# Patient Record
Sex: Female | Born: 1960 | Hispanic: No | Marital: Married | Smoking: Former smoker
Health system: Southern US, Community
[De-identification: ages and names within clinical notes are randomized; demographics above are authoritative.]

## PROBLEM LIST (undated history)

## (undated) DIAGNOSIS — G629 Polyneuropathy, unspecified: Secondary | ICD-10-CM

## (undated) DIAGNOSIS — G894 Chronic pain syndrome: Secondary | ICD-10-CM

## (undated) DIAGNOSIS — Z8619 Personal history of other infectious and parasitic diseases: Secondary | ICD-10-CM

## (undated) DIAGNOSIS — M797 Fibromyalgia: Secondary | ICD-10-CM

## (undated) DIAGNOSIS — G8929 Other chronic pain: Secondary | ICD-10-CM

## (undated) DIAGNOSIS — M1710 Unilateral primary osteoarthritis, unspecified knee: Secondary | ICD-10-CM

## (undated) DIAGNOSIS — R069 Unspecified abnormalities of breathing: Secondary | ICD-10-CM

## (undated) DIAGNOSIS — R87629 Unspecified abnormal cytological findings in specimens from vagina: Secondary | ICD-10-CM

## (undated) DIAGNOSIS — K635 Polyp of colon: Secondary | ICD-10-CM

## (undated) DIAGNOSIS — I10 Essential (primary) hypertension: Secondary | ICD-10-CM

## (undated) DIAGNOSIS — F32A Depression, unspecified: Secondary | ICD-10-CM

## (undated) DIAGNOSIS — F329 Major depressive disorder, single episode, unspecified: Secondary | ICD-10-CM

## (undated) DIAGNOSIS — K219 Gastro-esophageal reflux disease without esophagitis: Secondary | ICD-10-CM

## (undated) DIAGNOSIS — Z91030 Bee allergy status: Secondary | ICD-10-CM

## (undated) DIAGNOSIS — I639 Cerebral infarction, unspecified: Secondary | ICD-10-CM

## (undated) DIAGNOSIS — O24419 Gestational diabetes mellitus in pregnancy, unspecified control: Secondary | ICD-10-CM

## (undated) DIAGNOSIS — R296 Repeated falls: Secondary | ICD-10-CM

## (undated) DIAGNOSIS — M5416 Radiculopathy, lumbar region: Secondary | ICD-10-CM

## (undated) DIAGNOSIS — E785 Hyperlipidemia, unspecified: Secondary | ICD-10-CM

## (undated) DIAGNOSIS — D649 Anemia, unspecified: Secondary | ICD-10-CM

## (undated) DIAGNOSIS — K121 Other forms of stomatitis: Secondary | ICD-10-CM

## (undated) DIAGNOSIS — G4700 Insomnia, unspecified: Secondary | ICD-10-CM

## (undated) HISTORY — DX: Bee allergy status: Z91.030

## (undated) HISTORY — DX: Polyneuropathy, unspecified: G62.9

## (undated) HISTORY — DX: Other forms of stomatitis: K12.1

## (undated) HISTORY — DX: Hyperlipidemia, unspecified: E78.5

## (undated) HISTORY — DX: Anemia, unspecified: D64.9

## (undated) HISTORY — DX: Unspecified abnormal cytological findings in specimens from vagina: R87.629

## (undated) HISTORY — DX: Fibromyalgia: M79.7

## (undated) HISTORY — DX: Personal history of other infectious and parasitic diseases: Z86.19

## (undated) HISTORY — DX: Major depressive disorder, single episode, unspecified: F32.9

## (undated) HISTORY — PX: SPINE SURGERY: SHX786

## (undated) HISTORY — PX: BREAST BIOPSY: SHX20

## (undated) HISTORY — DX: Other chronic pain: G89.29

## (undated) HISTORY — DX: Unilateral primary osteoarthritis, unspecified knee: M17.10

## (undated) HISTORY — DX: Polyp of colon: K63.5

## (undated) HISTORY — DX: Repeated falls: R29.6

## (undated) HISTORY — DX: Cerebral infarction, unspecified: I63.9

## (undated) HISTORY — PX: BREAST SURGERY: SHX581

## (undated) HISTORY — DX: Insomnia, unspecified: G47.00

## (undated) HISTORY — DX: Depression, unspecified: F32.A

## (undated) HISTORY — DX: Unspecified abnormalities of breathing: R06.9

## (undated) HISTORY — DX: Essential (primary) hypertension: I10

## (undated) HISTORY — DX: Radiculopathy, lumbar region: M54.16

## (undated) HISTORY — PX: JOINT REPLACEMENT: SHX530

## (undated) HISTORY — DX: Gastro-esophageal reflux disease without esophagitis: K21.9

## (undated) HISTORY — DX: Chronic pain syndrome: G89.4

## (undated) HISTORY — DX: Gestational diabetes mellitus in pregnancy, unspecified control: O24.419

---

## 2006-02-23 ENCOUNTER — Other Ambulatory Visit: Admission: RE | Admit: 2006-02-23 | Discharge: 2006-02-23 | Payer: Self-pay | Admitting: Family Medicine

## 2006-07-02 ENCOUNTER — Ambulatory Visit: Payer: Self-pay | Admitting: Vascular Surgery

## 2006-07-02 ENCOUNTER — Ambulatory Visit (HOSPITAL_COMMUNITY): Admission: RE | Admit: 2006-07-02 | Discharge: 2006-07-02 | Payer: Self-pay | Admitting: Family Medicine

## 2006-08-05 ENCOUNTER — Other Ambulatory Visit: Admission: RE | Admit: 2006-08-05 | Discharge: 2006-08-05 | Payer: Self-pay | Admitting: Family Medicine

## 2010-04-10 DIAGNOSIS — R296 Repeated falls: Secondary | ICD-10-CM

## 2010-04-10 HISTORY — DX: Repeated falls: R29.6

## 2010-05-01 DIAGNOSIS — Z8619 Personal history of other infectious and parasitic diseases: Secondary | ICD-10-CM

## 2010-05-01 DIAGNOSIS — K121 Other forms of stomatitis: Secondary | ICD-10-CM

## 2010-05-01 HISTORY — DX: Personal history of other infectious and parasitic diseases: Z86.19

## 2010-05-01 HISTORY — DX: Other forms of stomatitis: K12.1

## 2010-05-23 DIAGNOSIS — G629 Polyneuropathy, unspecified: Secondary | ICD-10-CM

## 2010-05-23 HISTORY — DX: Polyneuropathy, unspecified: G62.9

## 2010-06-22 ENCOUNTER — Encounter: Payer: Self-pay | Admitting: Sports Medicine

## 2010-08-22 DIAGNOSIS — G894 Chronic pain syndrome: Secondary | ICD-10-CM

## 2010-08-22 DIAGNOSIS — G629 Polyneuropathy, unspecified: Secondary | ICD-10-CM

## 2010-08-22 DIAGNOSIS — G4700 Insomnia, unspecified: Secondary | ICD-10-CM

## 2010-08-22 DIAGNOSIS — M797 Fibromyalgia: Secondary | ICD-10-CM

## 2010-08-22 DIAGNOSIS — M5416 Radiculopathy, lumbar region: Secondary | ICD-10-CM

## 2010-08-22 DIAGNOSIS — I10 Essential (primary) hypertension: Secondary | ICD-10-CM

## 2010-08-22 HISTORY — DX: Fibromyalgia: M79.7

## 2010-08-22 HISTORY — DX: Radiculopathy, lumbar region: M54.16

## 2010-08-22 HISTORY — DX: Polyneuropathy, unspecified: G62.9

## 2010-08-22 HISTORY — DX: Insomnia, unspecified: G47.00

## 2010-08-22 HISTORY — DX: Chronic pain syndrome: G89.4

## 2010-08-22 HISTORY — DX: Essential (primary) hypertension: I10

## 2010-10-22 ENCOUNTER — Encounter: Payer: Self-pay | Admitting: Family Medicine

## 2012-09-08 ENCOUNTER — Other Ambulatory Visit: Payer: Self-pay | Admitting: Nurse Practitioner

## 2012-09-09 NOTE — Telephone Encounter (Signed)
Last saw [facility] [place] on 06-13-12 for chronic health check. Last written on 06-13-12 for #30 with 2RFs. Please advise. If approved please have nurse phone in to [place] in [place]. Thank you

## 2012-09-13 ENCOUNTER — Ambulatory Visit: Payer: Self-pay | Admitting: Nurse Practitioner

## 2012-09-15 ENCOUNTER — Other Ambulatory Visit: Payer: Self-pay | Admitting: Nurse Practitioner

## 2012-09-15 ENCOUNTER — Telehealth: Payer: Self-pay | Admitting: Nurse Practitioner

## 2012-09-15 MED ORDER — ZOLPIDEM TARTRATE 10 MG PO TABS: 10.0000 mg | ORAL_TABLET | Freq: Every evening | ORAL | Status: DC | PRN

## 2012-09-15 NOTE — Telephone Encounter (Signed)
Please advise 

## 2012-09-20 ENCOUNTER — Ambulatory Visit: Payer: Self-pay | Admitting: Nurse Practitioner

## 2012-10-05 ENCOUNTER — Ambulatory Visit (INDEPENDENT_AMBULATORY_CARE_PROVIDER_SITE_OTHER): Admitting: Nurse Practitioner

## 2012-10-05 ENCOUNTER — Encounter: Payer: Self-pay | Admitting: Nurse Practitioner

## 2012-10-05 VITALS — BP 91/68 | HR 91 | Temp 97.1°F | Ht 61.0 in | Wt 263.0 lb

## 2012-10-05 DIAGNOSIS — I6350 Cerebral infarction due to unspecified occlusion or stenosis of unspecified cerebral artery: Secondary | ICD-10-CM

## 2012-10-05 DIAGNOSIS — J45909 Unspecified asthma, uncomplicated: Secondary | ICD-10-CM

## 2012-10-05 DIAGNOSIS — I10 Essential (primary) hypertension: Secondary | ICD-10-CM

## 2012-10-05 DIAGNOSIS — F329 Major depressive disorder, single episode, unspecified: Secondary | ICD-10-CM

## 2012-10-05 DIAGNOSIS — G4700 Insomnia, unspecified: Secondary | ICD-10-CM

## 2012-10-05 DIAGNOSIS — K219 Gastro-esophageal reflux disease without esophagitis: Secondary | ICD-10-CM

## 2012-10-05 DIAGNOSIS — F32A Depression, unspecified: Secondary | ICD-10-CM

## 2012-10-05 DIAGNOSIS — I639 Cerebral infarction, unspecified: Secondary | ICD-10-CM

## 2012-10-05 DIAGNOSIS — M797 Fibromyalgia: Secondary | ICD-10-CM

## 2012-10-05 DIAGNOSIS — IMO0001 Reserved for inherently not codable concepts without codable children: Secondary | ICD-10-CM

## 2012-10-05 DIAGNOSIS — R3915 Urgency of urination: Secondary | ICD-10-CM

## 2012-10-05 DIAGNOSIS — E785 Hyperlipidemia, unspecified: Secondary | ICD-10-CM

## 2012-10-05 LAB — COMPLETE METABOLIC PANEL WITH GFR
ALT: 18 U/L (ref 0–35)
AST: 13 U/L (ref 0–37)
Albumin: 4.2 g/dL (ref 3.5–5.2)
Alkaline Phosphatase: 141 U/L — ABNORMAL HIGH (ref 39–117)
BUN: 23 mg/dL (ref 6–23)
Potassium: 3.8 mEq/L (ref 3.5–5.3)
Sodium: 139 mEq/L (ref 135–145)
Total Protein: 7.7 g/dL (ref 6.0–8.3)

## 2012-10-05 NOTE — Patient Instructions (Signed)

## 2012-10-05 NOTE — Progress Notes (Signed)
Subjective:    Patient ID: Marie Carter, female    DOB: 02/13/1961, 52 y.o.   MRN: [id number]  HPI-patient in today for follow-up of chronic medical  problems. Patient says she is doing about the same. Still has lots of pain from fibromyalgia but is tolerable. Other chronic problems include:  Patient Active Problem List   Diagnosis Date Noted  . GERD (gastroesophageal reflux disease) 10/05/2012  . Hyperlipidemia 10/05/2012  . Fibromyalgia 10/05/2012  . CVA (cerebral infarction) 10/05/2012  . Depression 10/05/2012  . Asthma, chronic 10/05/2012  . Hypertension 10/05/2012  . Urinary urgency 10/05/2012  . Insomnia 10/05/2012  Current meds:  Current Outpatient Prescriptions on File Prior to Visit  Medication Sig Dispense Refill  . cyclobenzaprine (AMRIX) 15 MG 24 hr capsule Take 15 mg by mouth daily.        . DULoxetine (CYMBALTA) 60 MG capsule Take 60 mg by mouth daily.        [name] eletriptan (RELPAX) 20 MG tablet One tablet by mouth at onset of headache. May repeat in 2 hours if headache persists or recurs. may repeat in 2 hours if necessary      . EPINEPHrine (EPI-PEN) 0.3 mg/0.3 mL DEVI Inject 0.3 mg into the muscle once. As Directed for Bee stings       . gabapentin (NEURONTIN) 300 MG capsule Take 300 mg by mouth. Take two tablets by mouth three times a day       . topiramate (TOPAMAX) 50 MG tablet Take 50 mg by mouth 3 (three) times daily.       . traMADol (ULTRAM) 50 MG tablet Take 50 mg by mouth 3 (three) times daily as needed.       . zolpidem (AMBIEN) 10 MG tablet Take 1 tablet (10 mg total) by mouth at bedtime as needed for sleep.  30 tablet  0   No current facility-administered medications on file prior to visit.         Review of Systems  Constitutional: Negative.   Eyes: Negative.   Respiratory: Negative.   Cardiovascular: Negative.   Genitourinary: Negative.   Musculoskeletal: Positive for back pain, joint swelling and arthralgias.  Neurological: Positive for  headaches (occassional). Negative for tremors, seizures, weakness and numbness.  Psychiatric/Behavioral: Negative.        Objective:   Physical Exam  Constitutional: She is oriented to person, place, and time. She appears well-developed and well-nourished.  HENT:  Nose: Nose normal.  Mouth/Throat: Oropharynx is clear and moist.  Eyes: EOM are normal.  Neck: Trachea normal, normal range of motion and full passive range of motion without pain. Neck supple. No JVD present. Carotid bruit is not present. No thyromegaly present.  Cardiovascular: Normal rate, regular rhythm, normal heart sounds and intact distal pulses.  Exam reveals no gallop and no friction rub.   No murmur heard. Pulmonary/Chest: Effort normal and breath sounds normal.  Abdominal: Soft. Bowel sounds are normal. She exhibits no distension and no mass. There is no tenderness.  Musculoskeletal: Normal range of motion.  Multiple tender points up and down spine  Lymphadenopathy:    She has no cervical adenopathy.  Neurological: She is alert and oriented to person, place, and time. She has normal reflexes.  Skin: Skin is warm and dry.  Psychiatric: She has a normal mood and affect. Her behavior is normal. Judgment and thought content normal.  BP 91/68  Pulse 91  Temp(Src) 97.1 F (36.2 C) (Oral)  Ht 5\' 1"  (1.549 m)  Wt 263 lb (119.296 kg)  BMI 49.72 kg/m2  LMP 09/13/2012         Assessment & Plan:  GERD (gastroesophageal reflux disease)  Hyperlipidemia - Plan: NMR Lipoprofile with Lipids  Fibromyalgia  CVA (cerebral infarction)  Depression  Asthma, chronic  Hypertension - Plan: COMPLETE METABOLIC PANEL WITH GFR  Urinary urgency  Insomnia  Continue all current meds Labs pending Diet and exercise encouraged F/U in 3 months  Mary-Margaret Daphine Deutscher, FNP

## 2012-10-07 LAB — NMR LIPOPROFILE WITH LIPIDS
Cholesterol, Total: 140 mg/dL (ref <200)
HDL Particle Number: 37.2 umol/L (ref >=30.5)
LDL Particle Number: 880 nmol/L (ref <1000)
Large VLDL-P: 3 nmol/L — ABNORMAL HIGH (ref <=2.7)
Triglycerides: 75 mg/dL (ref <150)
VLDL Size: 50.4 nm — ABNORMAL HIGH (ref <=46.6)

## 2012-10-13 ENCOUNTER — Other Ambulatory Visit: Payer: Self-pay | Admitting: Nurse Practitioner

## 2012-10-13 NOTE — Telephone Encounter (Signed)
Please call in ambien RX 

## 2012-10-13 NOTE — Telephone Encounter (Signed)
Last filled 09/15/12, last seen 10/05/12. Call into [facility]

## 2012-10-14 NOTE — Telephone Encounter (Signed)
Script called to [facility].

## 2012-10-31 ENCOUNTER — Ambulatory Visit (INDEPENDENT_AMBULATORY_CARE_PROVIDER_SITE_OTHER): Admitting: Family Medicine

## 2012-10-31 ENCOUNTER — Telehealth: Payer: Self-pay | Admitting: Family Medicine

## 2012-10-31 ENCOUNTER — Encounter: Payer: Self-pay | Admitting: Family Medicine

## 2012-10-31 VITALS — BP 112/76 | HR 76 | Temp 97.0°F | Ht 61.0 in | Wt 258.0 lb

## 2012-10-31 DIAGNOSIS — Z20828 Contact with and (suspected) exposure to other viral communicable diseases: Secondary | ICD-10-CM

## 2012-10-31 DIAGNOSIS — J069 Acute upper respiratory infection, unspecified: Secondary | ICD-10-CM

## 2012-10-31 DIAGNOSIS — J209 Acute bronchitis, unspecified: Secondary | ICD-10-CM

## 2012-10-31 MED ORDER — BENZONATATE 200 MG PO CAPS: 200.0000 mg | ORAL_CAPSULE | Freq: Two times a day (BID) | ORAL | Status: DC | PRN

## 2012-10-31 MED ORDER — OSELTAMIVIR PHOSPHATE 75 MG PO CAPS: 75.0000 mg | ORAL_CAPSULE | Freq: Two times a day (BID) | ORAL | Status: DC

## 2012-10-31 MED ORDER — AMOXICILLIN 875 MG PO TABS: 875.0000 mg | ORAL_TABLET | Freq: Two times a day (BID) | ORAL | Status: DC

## 2012-10-31 NOTE — Progress Notes (Signed)
Patient ID: Marie Carter, female   DOB: [birth year], 52 y.o.   MRN: [id number] SUBJECTIVE: HPI: Cough congestion started with a head congestion then went from there down. Fever and chills.. Symptoms for 6 days. Did get her flu shot. Her Uncle has had the flu last 2 weeks. bodyaches +  PMH/PSH: reviewed/updated in Epic  SH/FH: reviewed/updated in Epic  Allergies: reviewed/updated in Epic  Medications: reviewed/updated in Epic  Immunizations: reviewed/updated in Epic  ROS: As above in the HPI. All other systems are stable or negative.  OBJECTIVE: APPEARANCE:  Patient in no acute distress.The patient appeared well nourished and normally developed. Acyanotic. Waist: VITAL SIGNS:BP 112/76  Pulse 76  Temp(Src) 97 F (36.1 C) (Oral)  Ht 5\' 1"  (1.549 m)  Wt 258 lb (117.028 kg)  BMI 48.77 kg/m2  SpO2 97%  LMP 09/13/2012 Obese WF  SKIN: warm and  Dry without overt rashes, tattoos and scars  HEAD and Neck: without JVD, Head and scalp: normal Eyes:No scleral icterus. Fundi normal, eye movements normal. Ears: Auricle normal, canal normal, Tympanic membranes normal, insufflation normal. Nose: nasal congestion Throat: normal Neck & thyroid: normal  CHEST & LUNGS: Chest wall: normal Lungs:Rhonchi, rattling barking cough. No rales, no wheezes.paroxysms of cough  CVS: Reveals the PMI to be normally located. Regular rhythm, First and Second Heart sounds are normal,  absence of murmurs, rubs or gallops. Peripheral vasculature: Radial pulses: normal Dorsal pedis pulses: normal Posterior pulses: normal  ABDOMEN:  Appearance: obese Benign,, no organomegaly, no masses, no Abdominal Aortic enlargement. No Guarding , no rebound. No Bruits. Bowel sounds: normal  RECTAL: N/A GU: N/A  EXTREMETIES: nonedematous.  NEUROLOGIC: oriented to time,place and person; nonfocal.  ASSESSMENT: Acute upper respiratory infections of unspecified site - Plan: oseltamivir (TAMIFLU) 75 MG  capsule, benzonatate (TESSALON) 200 MG capsule  Exposure to influenza - Plan: oseltamivir (TAMIFLU) 75 MG capsule  Acute bronchitis - Plan: amoxicillin (AMOXIL) 875 MG tablet, benzonatate (TESSALON) 200 MG capsule  PLAN: No orders of the defined types were placed in this encounter.   Meds ordered this encounter  Medications  . oseltamivir (TAMIFLU) 75 MG capsule    Sig: Take 1 capsule (75 mg total) by mouth 2 (two) times daily.    Dispense:  10 capsule    Refill:  0  . amoxicillin (AMOXIL) 875 MG tablet    Sig: Take 1 tablet (875 mg total) by mouth 2 (two) times daily.    Dispense:  20 tablet    Refill:  0  . benzonatate (TESSALON) 200 MG capsule    Sig: Take 1 capsule (200 mg total) by mouth 2 (two) times daily as needed for cough.    Dispense:  20 capsule    Refill:  0   Will treat since she gives a h/o exposure to the influenza. Patient to use her albuterol inhaler as a preventative at this time.  Fluids rest.  Hand hygiene. Contagiousness discussed.  Return if symptoms worsen or fail to improve.  Alda Gaultney P. Modesto Charon, M.D.

## 2012-10-31 NOTE — Telephone Encounter (Signed)
appt made

## 2012-11-14 ENCOUNTER — Other Ambulatory Visit: Payer: Self-pay | Admitting: Nurse Practitioner

## 2012-11-16 NOTE — Telephone Encounter (Signed)
Last filled 10/13/12, last seen 10/05/12. [place] to called into [facility]

## 2012-11-16 NOTE — Telephone Encounter (Signed)
Please call in ambien rx with 2 refills 

## 2012-11-16 NOTE — Telephone Encounter (Signed)
Rx called to [facility]. 

## 2013-01-06 ENCOUNTER — Ambulatory Visit: Admitting: Nurse Practitioner

## 2013-02-10 ENCOUNTER — Encounter: Payer: Self-pay | Admitting: Nurse Practitioner

## 2013-02-10 ENCOUNTER — Ambulatory Visit (INDEPENDENT_AMBULATORY_CARE_PROVIDER_SITE_OTHER): Admitting: Nurse Practitioner

## 2013-02-10 VITALS — Temp 97.1°F | Ht 61.0 in | Wt 259.0 lb

## 2013-02-10 DIAGNOSIS — I10 Essential (primary) hypertension: Secondary | ICD-10-CM

## 2013-02-10 DIAGNOSIS — F329 Major depressive disorder, single episode, unspecified: Secondary | ICD-10-CM

## 2013-02-10 DIAGNOSIS — E785 Hyperlipidemia, unspecified: Secondary | ICD-10-CM

## 2013-02-10 DIAGNOSIS — M797 Fibromyalgia: Secondary | ICD-10-CM

## 2013-02-10 DIAGNOSIS — K219 Gastro-esophageal reflux disease without esophagitis: Secondary | ICD-10-CM

## 2013-02-10 DIAGNOSIS — L259 Unspecified contact dermatitis, unspecified cause: Secondary | ICD-10-CM

## 2013-02-10 DIAGNOSIS — I639 Cerebral infarction, unspecified: Secondary | ICD-10-CM

## 2013-02-10 DIAGNOSIS — G4700 Insomnia, unspecified: Secondary | ICD-10-CM

## 2013-02-10 DIAGNOSIS — IMO0001 Reserved for inherently not codable concepts without codable children: Secondary | ICD-10-CM

## 2013-02-10 DIAGNOSIS — G43909 Migraine, unspecified, not intractable, without status migrainosus: Secondary | ICD-10-CM

## 2013-02-10 DIAGNOSIS — I6350 Cerebral infarction due to unspecified occlusion or stenosis of unspecified cerebral artery: Secondary | ICD-10-CM

## 2013-02-10 MED ORDER — METHYLPREDNISOLONE ACETATE 80 MG/ML IJ SUSP
80.0000 mg | Freq: Once | INTRAMUSCULAR | Status: AC
  Administered 2013-02-10: 80 mg via INTRAMUSCULAR

## 2013-02-10 MED ORDER — ZOLPIDEM TARTRATE 10 MG PO TABS: 10.0000 mg | ORAL_TABLET | Freq: Every evening | ORAL | Status: DC | PRN

## 2013-02-10 MED ORDER — CYCLOBENZAPRINE HCL ER 15 MG PO CP24
ORAL_CAPSULE | ORAL | Status: DC
Start: 2013-02-10 — End: 2013-02-14

## 2013-02-10 MED ORDER — GABAPENTIN 300 MG PO CAPS
ORAL_CAPSULE | ORAL | Status: DC
Start: 2013-02-10 — End: 2013-02-14

## 2013-02-10 MED ORDER — PREDNISONE 20 MG PO TABS: ORAL_TABLET | ORAL | Status: DC

## 2013-02-10 MED ORDER — TOPIRAMATE 50 MG PO TABS: 50.0000 mg | ORAL_TABLET | Freq: Three times a day (TID) | ORAL | Status: DC

## 2013-02-10 NOTE — Patient Instructions (Signed)
Poison Ivy Poison ivy is a inflammation of the skin (contact dermatitis) caused by touching the allergens on the leaves of the ivy plant following previous exposure to the plant. The rash usually appears 48 hours after exposure. The rash is usually bumps (papules) or blisters (vesicles) in a linear pattern. Depending on your own sensitivity, the rash may simply cause redness and itching, or it may also progress to blisters which may break open. These must be well cared for to prevent secondary bacterial (germ) infection, followed by scarring. Keep any open areas dry, clean, dressed, and covered with an antibacterial ointment if needed. The eyes may also get puffy. The puffiness is worst in the morning and gets better as the day progresses. This dermatitis usually heals without scarring, within 2 to 3 weeks without treatment. HOME CARE INSTRUCTIONS  Thoroughly wash with soap and water as soon as you have been exposed to poison ivy. You have about one half hour to remove the plant resin before it will cause the rash. This washing will destroy the oil or antigen on the skin that is causing, or will cause, the rash. Be sure to wash under your fingernails as any plant resin there will continue to spread the rash. Do not rub skin vigorously when washing affected area. Poison ivy cannot spread if no oil from the plant remains on your body. A rash that has progressed to weeping sores will not spread the rash unless you have not washed thoroughly. It is also important to wash any clothes you have been wearing as these may carry active allergens. The rash will return if you wear the unwashed clothing, even several days later. Avoidance of the plant in the future is the best measure. Poison ivy plant can be recognized by the number of leaves. Generally, poison ivy has three leaves with flowering branches on a single stem. Diphenhydramine may be purchased over the counter and used as needed for itching. Do not drive with  this medication if it makes you drowsy.Ask your caregiver about medication for children. SEEK MEDICAL CARE IF:  Open sores develop.  Redness spreads beyond area of rash.  You notice purulent (pus-like) discharge.  You have increased pain.  Other signs of infection develop (such as fever). Document Released: 05/15/2000 Document Revised: 08/10/2011 Document Reviewed: 04/03/2009 Exit[employer] Patient Information 2014 [employer].  

## 2013-02-10 NOTE — Progress Notes (Signed)
Subjective:    Patient ID: Marie Carter, female    DOB: 26-Jul-1960, 52 y.o.   MRN: [id number]  HPI -patient in today for follow-up of chronic medical  problems. Patient says she is doing about the same. Still has lots of pain from fibromyalgia but is tolerable. Other chronic problems include:   * patient c/o rash that started after she was doing yard work- Very itchy and spreading up arms and to face. Patient Active Problem List   Diagnosis Date Noted  . GERD (gastroesophageal reflux disease) 10/05/2012  . Hyperlipidemia 10/05/2012  . Fibromyalgia 10/05/2012  . CVA (cerebral infarction) 10/05/2012  . Depression 10/05/2012  . Asthma, chronic 10/05/2012  . Hypertension 10/05/2012  . Urinary urgency 10/05/2012  . Insomnia 10/05/2012  Current meds:  Current Outpatient Prescriptions on File Prior to Visit  Medication Sig Dispense Refill  . albuterol (PROVENTIL HFA;VENTOLIN HFA) 108 (90 BASE) MCG/ACT inhaler Inhale 2 puffs into the lungs every 6 (six) hours as needed for wheezing.      [name] atorvastatin (LIPITOR) 40 MG tablet Take 40 mg by mouth daily.      . cyclobenzaprine (AMRIX) 15 MG 24 hr capsule Take 15 mg by mouth daily.        . DULoxetine (CYMBALTA) 60 MG capsule Take 60 mg by mouth daily.        [name] eletriptan (RELPAX) 20 MG tablet One tablet by mouth at onset of headache. May repeat in 2 hours if headache persists or recurs. may repeat in 2 hours if necessary      . EPINEPHrine (EPI-PEN) 0.3 mg/0.3 mL DEVI Inject 0.3 mg into the muscle once. As Directed for Bee stings       . fesoterodine (TOVIAZ) 8 MG TB24 Take 8 mg by mouth daily.      [name] gabapentin (NEURONTIN) 300 MG capsule Take 300 mg by mouth. Take two tablets by mouth three times a day       . lisinopril-hydrochlorothiazide (PRINZIDE,ZESTORETIC) 20-25 MG per tablet Take 1 tablet by mouth daily.      . pramipexole (MIRAPEX) 1 MG tablet Take 1 mg by mouth 2 (two) times daily.      [name] topiramate (TOPAMAX) 50 MG tablet Take 50 mg  by mouth 3 (three) times daily.       . traMADol (ULTRAM) 50 MG tablet Take 50 mg by mouth 3 (three) times daily as needed.       . zolpidem (AMBIEN) 10 MG tablet TAKE ONE TABLET BY MOUTH AT BEDTIME  30 tablet  2   No current facility-administered medications on file prior to visit.         Review of Systems  Constitutional: Negative.   Eyes: Negative.   Respiratory: Negative.   Cardiovascular: Negative.   Genitourinary: Negative.   Musculoskeletal: Positive for back pain, joint swelling and arthralgias.  Neurological: Positive for headaches (occassional). Negative for tremors, seizures, weakness and numbness.  Psychiatric/Behavioral: Negative.        Objective:   Physical Exam  Constitutional: She is oriented to person, place, and time. She appears well-developed and well-nourished.  HENT:  Nose: Nose normal.  Mouth/Throat: Oropharynx is clear and moist.  Eyes: EOM are normal.  Neck: Trachea normal, normal range of motion and full passive range of motion without pain. Neck supple. No JVD present. Carotid bruit is not present. No thyromegaly present.  Cardiovascular: Normal rate, regular rhythm, normal heart sounds and intact distal pulses.  Exam reveals no gallop and no  friction rub.   No murmur heard. Pulmonary/Chest: Effort normal and breath sounds normal.  Abdominal: Soft. Bowel sounds are normal. She exhibits no distension and no mass. There is no tenderness.  Musculoskeletal: Normal range of motion.  Multiple tender points up and down spine  Lymphadenopathy:    She has no cervical adenopathy.  Neurological: She is alert and oriented to person, place, and time. She has normal reflexes.  Skin: Skin is warm and dry.  Erythematous maculo vesicular lesions on forearms and face  Psychiatric: She has a normal mood and affect. Her behavior is normal. Judgment and thought content normal.  Temp(Src) 97.1 F (36.2 C) (Oral)  Ht 5\' 1"  (1.549 m)  Wt 259 lb (117.482 kg)  BMI  48.96 kg/m2         Assessment & Plan:   1. GERD (gastroesophageal reflux disease)   2. Hyperlipidemia   3. Fibromyalgia   4. CVA (cerebral infarction)   5. Depression   6. Asthma, chronic   7. Hypertension   8. Insomnia   9. Contact dermatitis   10. Migraines    Orders Placed This Encounter  Procedures  . CMP14+EGFR  . NMR, lipoprofile     Medication List       This list is accurate as of: 02/10/13  3:47 PM.  Always use your most recent med list.               albuterol 108 (90 BASE) MCG/ACT inhaler  Commonly known as:  PROVENTIL HFA;VENTOLIN HFA  Inhale 2 puffs into the lungs every 6 (six) hours as needed for wheezing.     atorvastatin 40 MG tablet  Commonly known as:  LIPITOR  Take 40 mg by mouth daily.     cyclobenzaprine 15 MG 24 hr capsule  Commonly known as:  AMRIX  2 po qhs     DULoxetine 60 MG capsule  Commonly known as:  CYMBALTA  Take 60 mg by mouth daily.     EPINEPHrine 0.3 mg/0.3 mL Devi  Commonly known as:  EPI-PEN  Inject 0.3 mg into the muscle once. As Directed for Bee stings     gabapentin 300 MG capsule  Commonly known as:  NEURONTIN  Take two tablets by mouth three times a day     lisinopril-hydrochlorothiazide 20-25 MG per tablet  Commonly known as:  PRINZIDE,ZESTORETIC  Take 1 tablet by mouth daily.     pramipexole 1 MG tablet  Commonly known as:  MIRAPEX  Take 1 mg by mouth 2 (two) times daily.     predniSONE 20 MG tablet  Commonly known as:  DELTASONE  2 po at same time every day for 5 days- start tomorrow     RELPAX 20 MG tablet  Generic drug:  eletriptan  One tablet by mouth at onset of headache. May repeat in 2 hours if headache persists or recurs. may repeat in 2 hours if necessary     topiramate 50 MG tablet  Commonly known as:  TOPAMAX  Take 1 tablet (50 mg total) by mouth 3 (three) times daily.     TOVIAZ 8 MG Tb24 tablet  Generic drug:  fesoterodine  Take 8 mg by mouth daily.     traMADol 50 MG tablet   Commonly known as:  ULTRAM  Take 50 mg by mouth 3 (three) times daily as needed.     zolpidem 10 MG tablet  Commonly known as:  AMBIEN  Take 1 tablet (10 mg total) by  mouth at bedtime as needed for sleep.         Continue all current meds Labs pending Diet and exercise encouraged F/U in 3 months Avoid scratching lesions Cool compresses- calamine lotion   Mary-Margaret Daphine Deutscher, Fnp

## 2013-02-12 LAB — CMP14+EGFR
Albumin/Globulin Ratio: 1.4 (ref 1.1–2.5)
Albumin: 4.5 g/dL (ref 3.5–5.5)
Calcium: 9.9 mg/dL (ref 8.7–10.2)
Creatinine, Ser: 0.97 mg/dL (ref 0.57–1.00)
GFR calc Af Amer: 77 mL/min/{1.73_m2} (ref >59)
GFR calc non Af Amer: 67 mL/min/{1.73_m2} (ref >59)
Globulin, Total: 3.3 g/dL (ref 1.5–4.5)
Glucose: 102 mg/dL — ABNORMAL HIGH (ref 65–99)
Total Bilirubin: 0.3 mg/dL (ref 0.0–1.2)
Total Protein: 7.8 g/dL (ref 6.0–8.5)

## 2013-02-12 LAB — NMR, LIPOPROFILE
Cholesterol: 150 mg/dL (ref <200)
HDL Cholesterol by NMR: 55 mg/dL (ref >=40)
HDL Particle Number: 42.1 umol/L (ref >=30.5)
LDL Size: 21.2 nm (ref >20.5)
Small LDL Particle Number: 496 nmol/L (ref <=527)
Triglycerides by NMR: 105 mg/dL (ref <150)

## 2013-02-14 ENCOUNTER — Other Ambulatory Visit: Payer: Self-pay | Admitting: Nurse Practitioner

## 2013-02-14 DIAGNOSIS — G43909 Migraine, unspecified, not intractable, without status migrainosus: Secondary | ICD-10-CM

## 2013-02-14 DIAGNOSIS — M797 Fibromyalgia: Secondary | ICD-10-CM

## 2013-02-14 MED ORDER — TOPIRAMATE 50 MG PO TABS: 50.0000 mg | ORAL_TABLET | Freq: Three times a day (TID) | ORAL | Status: DC

## 2013-02-14 MED ORDER — GABAPENTIN 300 MG PO CAPS: ORAL_CAPSULE | ORAL | Status: DC

## 2013-02-14 MED ORDER — CYCLOBENZAPRINE HCL ER 15 MG PO CP24: ORAL_CAPSULE | ORAL | Status: DC

## 2013-02-15 ENCOUNTER — Other Ambulatory Visit: Payer: Self-pay | Admitting: Nurse Practitioner

## 2013-02-16 NOTE — Telephone Encounter (Signed)
Please call in ambien rx 

## 2013-02-16 NOTE — Telephone Encounter (Signed)
Last seen 02/10/13  FPW   If approved route to nurse to call into [facility]

## 2013-02-17 ENCOUNTER — Other Ambulatory Visit: Payer: Self-pay | Admitting: Nurse Practitioner

## 2013-02-17 NOTE — Telephone Encounter (Signed)
rx called in to [facility]

## 2013-02-20 NOTE — Telephone Encounter (Signed)
Last seen 02/10/13  MMM If approved route to nurse to phone into [facility]

## 2013-02-20 NOTE — Telephone Encounter (Signed)
Please call in ambien rx with 1 refill 

## 2013-02-21 NOTE — Telephone Encounter (Signed)
Called in.

## 2013-05-12 ENCOUNTER — Ambulatory Visit: Admitting: Nurse Practitioner

## 2013-05-17 ENCOUNTER — Other Ambulatory Visit: Payer: Self-pay | Admitting: Nurse Practitioner

## 2013-05-18 ENCOUNTER — Other Ambulatory Visit: Payer: Self-pay | Admitting: Nurse Practitioner

## 2013-05-18 NOTE — Telephone Encounter (Signed)
Last seen 02/10/13  MMM If approved route to nurse to call into [facility]

## 2013-05-18 NOTE — Telephone Encounter (Signed)
Please call in ambien rx 

## 2013-05-19 NOTE — Telephone Encounter (Signed)
[facility] refill called in.

## 2013-06-07 ENCOUNTER — Encounter: Payer: Self-pay | Admitting: Nurse Practitioner

## 2013-06-07 ENCOUNTER — Ambulatory Visit (INDEPENDENT_AMBULATORY_CARE_PROVIDER_SITE_OTHER): Admitting: Nurse Practitioner

## 2013-06-07 VITALS — BP 107/75 | HR 80 | Temp 97.0°F | Ht 61.0 in

## 2013-06-07 DIAGNOSIS — F32A Depression, unspecified: Secondary | ICD-10-CM

## 2013-06-07 DIAGNOSIS — K219 Gastro-esophageal reflux disease without esophagitis: Secondary | ICD-10-CM

## 2013-06-07 DIAGNOSIS — F329 Major depressive disorder, single episode, unspecified: Secondary | ICD-10-CM

## 2013-06-07 DIAGNOSIS — I6350 Cerebral infarction due to unspecified occlusion or stenosis of unspecified cerebral artery: Secondary | ICD-10-CM

## 2013-06-07 DIAGNOSIS — I10 Essential (primary) hypertension: Secondary | ICD-10-CM

## 2013-06-07 DIAGNOSIS — G4700 Insomnia, unspecified: Secondary | ICD-10-CM

## 2013-06-07 DIAGNOSIS — M797 Fibromyalgia: Secondary | ICD-10-CM

## 2013-06-07 DIAGNOSIS — IMO0001 Reserved for inherently not codable concepts without codable children: Secondary | ICD-10-CM

## 2013-06-07 DIAGNOSIS — F3289 Other specified depressive episodes: Secondary | ICD-10-CM

## 2013-06-07 DIAGNOSIS — E785 Hyperlipidemia, unspecified: Secondary | ICD-10-CM

## 2013-06-07 DIAGNOSIS — I639 Cerebral infarction, unspecified: Secondary | ICD-10-CM

## 2013-06-07 DIAGNOSIS — J45909 Unspecified asthma, uncomplicated: Secondary | ICD-10-CM

## 2013-06-07 MED ORDER — LISINOPRIL-HYDROCHLOROTHIAZIDE 20-25 MG PO TABS: 1.0000 | ORAL_TABLET | Freq: Every day | ORAL | Status: DC

## 2013-06-07 MED ORDER — ZOLPIDEM TARTRATE 10 MG PO TABS: 10.0000 mg | ORAL_TABLET | Freq: Every day | ORAL | Status: DC

## 2013-06-07 NOTE — Progress Notes (Signed)
Subjective:    Patient ID: Marie Carter, female    DOB: 1960/07/08, 53 y.o.   MRN: [id number]  HPI Patient here today for follow up- She is doing well. No complaints since last visit- No changes Patient Active Problem List   Diagnosis Date Noted  . GERD (gastroesophageal reflux disease) 10/05/2012  . Hyperlipidemia 10/05/2012  . Fibromyalgia 10/05/2012  . CVA (cerebral infarction) 10/05/2012  . Depression 10/05/2012  . Asthma, chronic 10/05/2012  . Hypertension 10/05/2012  . Urinary urgency 10/05/2012  . Insomnia 10/05/2012   Outpatient Encounter Prescriptions as of 06/07/2013  Medication Sig  . albuterol (PROVENTIL HFA;VENTOLIN HFA) 108 (90 BASE) MCG/ACT inhaler Inhale 2 puffs into the lungs every 6 (six) hours as needed for wheezing.  [name] atorvastatin (LIPITOR) 40 MG tablet Take 40 mg by mouth daily.  . cyclobenzaprine (AMRIX) 15 MG 24 hr capsule 2 po qhs  . DULoxetine (CYMBALTA) 60 MG capsule Take 60 mg by mouth daily.    [name] eletriptan (RELPAX) 20 MG tablet One tablet by mouth at onset of headache. May repeat in 2 hours if headache persists or recurs. may repeat in 2 hours if necessary  . EPINEPHrine (EPI-PEN) 0.3 mg/0.3 mL DEVI Inject 0.3 mg into the muscle once. As Directed for Bee stings   . fesoterodine (TOVIAZ) 8 MG TB24 Take 8 mg by mouth daily.  [name] gabapentin (NEURONTIN) 300 MG capsule Take two tablets by mouth three times a day  . lisinopril-hydrochlorothiazide (PRINZIDE,ZESTORETIC) 20-25 MG per tablet Take 1 tablet by mouth daily.  . pramipexole (MIRAPEX) 1 MG tablet Take 1 mg by mouth 2 (two) times daily.  . predniSONE (DELTASONE) 20 MG tablet 2 po at same time every day for 5 days- start tomorrow  . topiramate (TOPAMAX) 50 MG tablet Take 1 tablet (50 mg total) by mouth 3 (three) times daily.  . traMADol (ULTRAM) 50 MG tablet Take 50 mg by mouth 3 (three) times daily as needed.   . zolpidem (AMBIEN) 10 MG tablet TAKE ONE TABLET BY MOUTH AT BEDTIME       Review of  Systems  Constitutional: Negative.   HENT: Negative.   Respiratory: Negative.   Cardiovascular: Negative.   Gastrointestinal: Negative.   Musculoskeletal: Positive for back pain, gait problem and myalgias.  Skin: Negative.   Neurological: Positive for dizziness.  Hematological: Negative.   Psychiatric/Behavioral: Negative.   All other systems reviewed and are negative.       Objective:   Physical Exam  Constitutional: She is oriented to person, place, and time. She appears well-developed and well-nourished.  HENT:  Nose: Nose normal.  Mouth/Throat: Oropharynx is clear and moist.  Eyes: EOM are normal.  Neck: Trachea normal, normal range of motion and full passive range of motion without pain. Neck supple. No JVD present. Carotid bruit is not present. No thyromegaly present.  Cardiovascular: Normal rate, regular rhythm, normal heart sounds and intact distal pulses.  Exam reveals no gallop and no friction rub.   No murmur heard. Pulmonary/Chest: Effort normal and breath sounds normal.  Abdominal: Soft. Bowel sounds are normal. She exhibits no distension and no mass. There is no tenderness.  Musculoskeletal: Normal range of motion.  Unsteady gait- walking with cane  Lymphadenopathy:    She has no cervical adenopathy.  Neurological: She is alert and oriented to person, place, and time. She has normal reflexes.  Skin: Skin is warm and dry.  Psychiatric: She has a normal mood and affect. Her behavior is normal. Judgment  and thought content normal.   BP 107/75  Pulse 80  Temp(Src) 97 F (36.1 C) (Oral)  Ht 5' 1"  (1.549 m)        Assessment & Plan:   1. Insomnia   2. Hypertension   3. Hyperlipidemia   4. GERD (gastroesophageal reflux disease)   5. Fibromyalgia   6. Depression   7. CVA (cerebral infarction)   8. Asthma, chronic    Orders Placed This Encounter  Procedures  . CMP14+EGFR  . NMR, lipoprofile   Meds ordered this encounter  Medications  .  lisinopril-hydrochlorothiazide (PRINZIDE,ZESTORETIC) 20-25 MG per tablet    Sig: Take 1 tablet by mouth daily.    Dispense:  90 tablet    Refill:  1    Order Specific Question:  Supervising Provider    Answer:  Chipper Herb [[id number]]  . zolpidem (AMBIEN) 10 MG tablet    Sig: Take 1 tablet (10 mg total) by mouth at bedtime.    Dispense:  30 tablet    Refill:  1    Order Specific Question:  Supervising Provider    Answer:  Joycelyn Man    Continue all meds Labs pending Diet and exercise encouraged Health maintenance reviewed Follow up in 3 months  [place], FNP

## 2013-06-07 NOTE — Patient Instructions (Signed)

## 2013-06-09 LAB — CMP14+EGFR
A/G RATIO: 1.6 (ref 1.1–2.5)
ALT: 32 IU/L (ref 0–32)
AST: 19 IU/L (ref 0–40)
Albumin: 4.6 g/dL (ref 3.5–5.5)
Alkaline Phosphatase: 146 IU/L — ABNORMAL HIGH (ref 39–117)
BILIRUBIN TOTAL: 0.3 mg/dL (ref 0.0–1.2)
BUN/Creatinine Ratio: 19 (ref 9–23)
BUN: 18 mg/dL (ref 6–24)
CO2: 27 mmol/L (ref 18–29)
Calcium: 9.5 mg/dL (ref 8.7–10.2)
Chloride: 101 mmol/L (ref 97–108)
Creatinine, Ser: 0.95 mg/dL (ref 0.57–1.00)
GFR, EST AFRICAN AMERICAN: 79 mL/min/{1.73_m2} (ref >59)
GFR, EST NON AFRICAN AMERICAN: 68 mL/min/{1.73_m2} (ref >59)
GLOBULIN, TOTAL: 2.8 g/dL (ref 1.5–4.5)
Glucose: 107 mg/dL — ABNORMAL HIGH (ref 65–99)
POTASSIUM: 4.3 mmol/L (ref 3.5–5.2)
SODIUM: 142 mmol/L (ref 134–144)
Total Protein: 7.4 g/dL (ref 6.0–8.5)

## 2013-06-09 LAB — NMR, LIPOPROFILE
Cholesterol: 166 mg/dL (ref <200)
HDL Cholesterol by NMR: 58 mg/dL (ref >=40)
HDL PARTICLE NUMBER: 37.5 umol/L (ref >=30.5)
LDL Particle Number: 1258 nmol/L — ABNORMAL HIGH (ref <1000)
LDL SIZE: 21 nm (ref >20.5)
LDLC SERPL CALC-MCNC: 90 mg/dL (ref <100)
LP-IR Score: 52 — ABNORMAL HIGH (ref <=45)
SMALL LDL PARTICLE NUMBER: 718 nmol/L — AB (ref <=527)
TRIGLYCERIDES BY NMR: 89 mg/dL (ref <150)

## 2013-07-07 ENCOUNTER — Other Ambulatory Visit: Payer: Self-pay | Admitting: Nurse Practitioner

## 2013-07-10 NOTE — Telephone Encounter (Signed)
Last seen 06/07/13  MMM

## 2013-08-15 ENCOUNTER — Other Ambulatory Visit: Payer: Self-pay | Admitting: Nurse Practitioner

## 2013-08-17 NOTE — Telephone Encounter (Signed)
rx ready for pickup 

## 2013-08-17 NOTE — Telephone Encounter (Signed)
Last refill 07/19/13. Last ov 06/07/13. [facility] [place] [facility]

## 2013-08-17 NOTE — Telephone Encounter (Signed)
rx called into pharmacy

## 2013-09-06 ENCOUNTER — Ambulatory Visit: Admitting: Nurse Practitioner

## 2013-09-18 ENCOUNTER — Other Ambulatory Visit: Payer: Self-pay | Admitting: Nurse Practitioner

## 2013-09-20 ENCOUNTER — Other Ambulatory Visit: Payer: Self-pay | Admitting: Nurse Practitioner

## 2013-09-20 NOTE — Telephone Encounter (Signed)
Please call in ambien  with 0 refills 

## 2013-09-20 NOTE — Telephone Encounter (Signed)
Last ov 06/07/13. Last refill 08/17/13. [facility] [place]

## 2013-09-20 NOTE — Telephone Encounter (Signed)
Rx called in 

## 2013-09-27 ENCOUNTER — Encounter: Payer: Self-pay | Admitting: *Deleted

## 2013-10-11 ENCOUNTER — Ambulatory Visit: Admitting: Nurse Practitioner

## 2013-10-12 ENCOUNTER — Encounter: Payer: Self-pay | Admitting: Nurse Practitioner

## 2013-10-12 ENCOUNTER — Ambulatory Visit (INDEPENDENT_AMBULATORY_CARE_PROVIDER_SITE_OTHER): Admitting: Nurse Practitioner

## 2013-10-12 VITALS — BP 146/101 | HR 97 | Temp 98.1°F | Wt 265.2 lb

## 2013-10-12 DIAGNOSIS — F32A Depression, unspecified: Secondary | ICD-10-CM

## 2013-10-12 DIAGNOSIS — F3289 Other specified depressive episodes: Secondary | ICD-10-CM

## 2013-10-12 DIAGNOSIS — I639 Cerebral infarction, unspecified: Secondary | ICD-10-CM

## 2013-10-12 DIAGNOSIS — IMO0001 Reserved for inherently not codable concepts without codable children: Secondary | ICD-10-CM

## 2013-10-12 DIAGNOSIS — K219 Gastro-esophageal reflux disease without esophagitis: Secondary | ICD-10-CM

## 2013-10-12 DIAGNOSIS — E785 Hyperlipidemia, unspecified: Secondary | ICD-10-CM

## 2013-10-12 DIAGNOSIS — J45909 Unspecified asthma, uncomplicated: Secondary | ICD-10-CM

## 2013-10-12 DIAGNOSIS — G4700 Insomnia, unspecified: Secondary | ICD-10-CM

## 2013-10-12 DIAGNOSIS — F329 Major depressive disorder, single episode, unspecified: Secondary | ICD-10-CM

## 2013-10-12 DIAGNOSIS — I6350 Cerebral infarction due to unspecified occlusion or stenosis of unspecified cerebral artery: Secondary | ICD-10-CM

## 2013-10-12 DIAGNOSIS — M797 Fibromyalgia: Secondary | ICD-10-CM

## 2013-10-12 DIAGNOSIS — I10 Essential (primary) hypertension: Secondary | ICD-10-CM

## 2013-10-12 MED ORDER — EPINEPHRINE 0.3 MG/0.3ML IJ SOAJ: 0.3000 mg | Freq: Once | INTRAMUSCULAR | Status: AC

## 2013-10-12 MED ORDER — ZOLPIDEM TARTRATE 10 MG PO TABS: ORAL_TABLET | ORAL | Status: DC

## 2013-10-12 NOTE — Patient Instructions (Signed)

## 2013-10-12 NOTE — Progress Notes (Signed)
Subjective:    Patient ID: Marie Carter, female    DOB: 11-Dec-1960, 53 y.o.   MRN: [id number]  HPI  Patient in today for follow up of chronic medical problems- She is doing will right now- Her only complaint is trouble breathing when she goes outside, especially when there  Is freshly cut grass. Patient Active Problem List   Diagnosis Date Noted  . GERD (gastroesophageal reflux disease) 10/05/2012  . Hyperlipidemia 10/05/2012  . Fibromyalgia 10/05/2012  . CVA (cerebral infarction) 10/05/2012  . Depression 10/05/2012  . Asthma, chronic 10/05/2012  . Hypertension 10/05/2012  . Urinary urgency 10/05/2012  . Insomnia 10/05/2012   Outpatient Encounter Prescriptions as of 10/12/2013  Medication Sig  . albuterol (PROVENTIL HFA;VENTOLIN HFA) 108 (90 BASE) MCG/ACT inhaler Inhale 2 puffs into the lungs every 6 (six) hours as needed for wheezing.  . AMRIX 15 MG 24 hr capsule TAKE 2 CAPSULES AT BEDTIME  . atorvastatin (LIPITOR) 40 MG tablet Take 40 mg by mouth daily.  . DULoxetine (CYMBALTA) 60 MG capsule Take 60 mg by mouth daily.    [name] eletriptan (RELPAX) 20 MG tablet One tablet by mouth at onset of headache. May repeat in 2 hours if headache persists or recurs. may repeat in 2 hours if necessary  . fesoterodine (TOVIAZ) 8 MG TB24 Take 8 mg by mouth daily.  [name] gabapentin (NEURONTIN) 300 MG capsule TAKE 2 CAPSULES THREE TIMES A DAY  . lisinopril-hydrochlorothiazide (PRINZIDE,ZESTORETIC) 20-25 MG per tablet Take 1 tablet by mouth daily.  . pramipexole (MIRAPEX) 1 MG tablet Take 1 mg by mouth 2 (two) times daily.  [name] topiramate (TOPAMAX) 50 MG tablet TAKE 1 TABLET THREE TIMES A DAY  . traMADol (ULTRAM) 50 MG tablet Take 50 mg by mouth 3 (three) times daily as needed.   . zolpidem (AMBIEN) 10 MG tablet TAKE ONE TABLET BY MOUTH ONCE DAILY AT BEDTIME  . [DISCONTINUED] EPINEPHrine (EPI-PEN) 0.3 mg/0.3 mL DEVI Inject 0.3 mg into the muscle once. As Directed for Bee stings   . EPINEPHrine 0.3 mg/0.3 mL  IJ SOAJ injection Inject 0.3 mLs (0.3 mg total) into the muscle once.  . [DISCONTINUED] predniSONE (DELTASONE) 20 MG tablet 2 po at same time every day for 5 days- start tomorrow       Review of Systems  Constitutional: Negative.   HENT: Negative.   Respiratory: Negative.   Cardiovascular: Negative.   Gastrointestinal: Negative.   Genitourinary: Negative.   Neurological: Negative.   Psychiatric/Behavioral: Negative.        Objective:   Physical Exam  Constitutional: She is oriented to person, place, and time. She appears well-developed and well-nourished.  HENT:  Nose: Nose normal.  Mouth/Throat: Oropharynx is clear and moist.  Eyes: EOM are normal.  Neck: Trachea normal, normal range of motion and full passive range of motion without pain. Neck supple. No JVD present. Carotid bruit is not present. No thyromegaly present.  Cardiovascular: Normal rate, regular rhythm, normal heart sounds and intact distal pulses.  Exam reveals no gallop and no friction rub.   No murmur heard. Pulmonary/Chest: Effort normal and breath sounds normal.  Abdominal: Soft. Bowel sounds are normal. She exhibits no distension and no mass. There is no tenderness.  Musculoskeletal: Normal range of motion.  Walking with a cane- very unsteady gait  Lymphadenopathy:    She has no cervical adenopathy.  Neurological: She is alert and oriented to person, place, and time. She has normal reflexes.  Skin: Skin is warm and dry.  Psychiatric: She has a normal mood and affect. Her behavior is normal. Judgment and thought content normal.   BP 146/101  Pulse 97  Temp(Src) 98.1 F (36.7 C) (Oral)  Wt 265 lb 3.2 oz (120.294 kg)        Assessment & Plan:   1. Insomnia   2. Hypertension   3. Hyperlipidemia   4. GERD (gastroesophageal reflux disease)   5. Fibromyalgia   6. CVA (cerebral infarction)   7. Depression   8. Asthma, chronic    Orders Placed This Encounter  Procedures  . CMP14+EGFR  . NMR,  lipoprofile  . Thyroid Panel With TSH   Meds ordered this encounter  Medications  . EPINEPHrine 0.3 mg/0.3 mL IJ SOAJ injection    Sig: Inject 0.3 mLs (0.3 mg total) into the muscle once.    Dispense:  2 Device    Refill:  2    Order Specific Question:  Supervising Provider    Answer:  Chipper Herb [[id number]]    Labs pending Health maintenance reviewed Diet and exercise encouraged Continue all meds Follow up  In 3 months prn   Mary-Margaret Hassell Done, FNP

## 2013-10-13 LAB — CMP14+EGFR
ALBUMIN: 4.2 g/dL (ref 3.5–5.5)
ALT: 53 IU/L — AB (ref 0–32)
AST: 37 IU/L (ref 0–40)
Albumin/Globulin Ratio: 1.4 (ref 1.1–2.5)
Alkaline Phosphatase: 132 IU/L — ABNORMAL HIGH (ref 39–117)
BUN/Creatinine Ratio: 15 (ref 9–23)
BUN: 14 mg/dL (ref 6–24)
CHLORIDE: 103 mmol/L (ref 97–108)
CO2: 23 mmol/L (ref 18–29)
Calcium: 9.2 mg/dL (ref 8.7–10.2)
Creatinine, Ser: 0.96 mg/dL (ref 0.57–1.00)
GFR calc Af Amer: 78 mL/min/{1.73_m2} (ref >59)
GFR calc non Af Amer: 67 mL/min/{1.73_m2} (ref >59)
GLUCOSE: 94 mg/dL (ref 65–99)
Globulin, Total: 2.9 g/dL (ref 1.5–4.5)
POTASSIUM: 4 mmol/L (ref 3.5–5.2)
Sodium: 143 mmol/L (ref 134–144)
TOTAL PROTEIN: 7.1 g/dL (ref 6.0–8.5)
Total Bilirubin: 0.3 mg/dL (ref 0.0–1.2)

## 2013-10-13 LAB — NMR, LIPOPROFILE
Cholesterol: 178 mg/dL (ref 100–199)
HDL CHOLESTEROL BY NMR: 57 mg/dL (ref >39)
HDL Particle Number: 36.5 umol/L (ref >=30.5)
LDL PARTICLE NUMBER: 958 nmol/L (ref <1000)
LDL Size: 21.5 nm (ref >20.5)
LDLC SERPL CALC-MCNC: 99 mg/dL (ref 0–99)
LP-IR SCORE: 27 (ref <=45)
Small LDL Particle Number: 361 nmol/L (ref <=527)
TRIGLYCERIDES BY NMR: 108 mg/dL (ref 0–149)

## 2013-10-13 LAB — THYROID PANEL WITH TSH
Free Thyroxine Index: 2.2 (ref 1.2–4.9)
T3 Uptake Ratio: 25 % (ref 24–39)
T4, Total: 8.6 ug/dL (ref 4.5–12.0)
TSH: 1.77 u[IU]/mL (ref 0.450–4.500)

## 2013-10-28 ENCOUNTER — Other Ambulatory Visit: Payer: Self-pay | Admitting: Nurse Practitioner

## 2013-11-20 ENCOUNTER — Other Ambulatory Visit: Payer: Self-pay | Admitting: Nurse Practitioner

## 2013-11-21 NOTE — Telephone Encounter (Signed)
Please call in ambien with 1 refills 

## 2013-11-21 NOTE — Telephone Encounter (Signed)
CAlled in 

## 2013-12-01 ENCOUNTER — Other Ambulatory Visit: Payer: Self-pay | Admitting: Nurse Practitioner

## 2013-12-04 NOTE — Telephone Encounter (Signed)
Last seen 10/12/13  MMM  Requesting 90 day supply

## 2014-01-15 ENCOUNTER — Ambulatory Visit (INDEPENDENT_AMBULATORY_CARE_PROVIDER_SITE_OTHER): Admitting: Nurse Practitioner

## 2014-01-15 ENCOUNTER — Encounter (INDEPENDENT_AMBULATORY_CARE_PROVIDER_SITE_OTHER): Payer: Self-pay

## 2014-01-15 ENCOUNTER — Encounter: Payer: Self-pay | Admitting: Nurse Practitioner

## 2014-01-15 VITALS — BP 146/100 | HR 85 | Temp 97.1°F | Ht 61.0 in | Wt 265.0 lb

## 2014-01-15 DIAGNOSIS — K219 Gastro-esophageal reflux disease without esophagitis: Secondary | ICD-10-CM

## 2014-01-15 DIAGNOSIS — J4520 Mild intermittent asthma, uncomplicated: Secondary | ICD-10-CM

## 2014-01-15 DIAGNOSIS — F329 Major depressive disorder, single episode, unspecified: Secondary | ICD-10-CM

## 2014-01-15 DIAGNOSIS — I10 Essential (primary) hypertension: Secondary | ICD-10-CM

## 2014-01-15 DIAGNOSIS — G2581 Restless legs syndrome: Secondary | ICD-10-CM

## 2014-01-15 DIAGNOSIS — E785 Hyperlipidemia, unspecified: Secondary | ICD-10-CM

## 2014-01-15 DIAGNOSIS — I6350 Cerebral infarction due to unspecified occlusion or stenosis of unspecified cerebral artery: Secondary | ICD-10-CM

## 2014-01-15 DIAGNOSIS — F3289 Other specified depressive episodes: Secondary | ICD-10-CM

## 2014-01-15 DIAGNOSIS — G43911 Migraine, unspecified, intractable, with status migrainosus: Secondary | ICD-10-CM

## 2014-01-15 DIAGNOSIS — R3915 Urgency of urination: Secondary | ICD-10-CM

## 2014-01-15 DIAGNOSIS — J45909 Unspecified asthma, uncomplicated: Secondary | ICD-10-CM

## 2014-01-15 DIAGNOSIS — G4700 Insomnia, unspecified: Secondary | ICD-10-CM

## 2014-01-15 DIAGNOSIS — M797 Fibromyalgia: Secondary | ICD-10-CM

## 2014-01-15 DIAGNOSIS — Z713 Dietary counseling and surveillance: Secondary | ICD-10-CM

## 2014-01-15 DIAGNOSIS — F32A Depression, unspecified: Secondary | ICD-10-CM

## 2014-01-15 DIAGNOSIS — IMO0001 Reserved for inherently not codable concepts without codable children: Secondary | ICD-10-CM

## 2014-01-15 MED ORDER — FESOTERODINE FUMARATE ER 8 MG PO TB24: 8.0000 mg | ORAL_TABLET | Freq: Every day | ORAL | Status: DC

## 2014-01-15 MED ORDER — CYCLOBENZAPRINE HCL ER 15 MG PO CP24: ORAL_CAPSULE | ORAL | Status: DC

## 2014-01-15 MED ORDER — GABAPENTIN 300 MG PO CAPS
ORAL_CAPSULE | ORAL | Status: DC
Start: 2014-01-15 — End: 2014-07-23

## 2014-01-15 MED ORDER — ZOLPIDEM TARTRATE 10 MG PO TABS
ORAL_TABLET | ORAL | Status: DC
Start: 2014-01-15 — End: 2014-03-23

## 2014-01-15 MED ORDER — TOPIRAMATE 50 MG PO TABS: ORAL_TABLET | ORAL | Status: DC

## 2014-01-15 MED ORDER — ATORVASTATIN CALCIUM 40 MG PO TABS: 40.0000 mg | ORAL_TABLET | Freq: Every day | ORAL | Status: DC

## 2014-01-15 MED ORDER — LISINOPRIL-HYDROCHLOROTHIAZIDE 20-25 MG PO TABS: ORAL_TABLET | ORAL | Status: DC

## 2014-01-15 MED ORDER — PRAMIPEXOLE DIHYDROCHLORIDE 1 MG PO TABS: 1.0000 mg | ORAL_TABLET | Freq: Two times a day (BID) | ORAL | Status: DC

## 2014-01-15 MED ORDER — DULOXETINE HCL 60 MG PO CPEP: 60.0000 mg | ORAL_CAPSULE | Freq: Every day | ORAL | Status: DC

## 2014-01-15 MED ORDER — ELETRIPTAN HYDROBROMIDE 20 MG PO TABS: 40.0000 mg | ORAL_TABLET | ORAL | Status: DC | PRN

## 2014-01-15 MED ORDER — TRAMADOL HCL 50 MG PO TABS: 50.0000 mg | ORAL_TABLET | Freq: Three times a day (TID) | ORAL | Status: DC | PRN

## 2014-01-15 NOTE — Progress Notes (Signed)
Subjective:    Patient ID: Marie Carter, female    DOB: July 23, 1960, 53 y.o.   MRN: [id number]  HPI Patient in today for follow up of chronic medical problems- SHe is doing okay- no worsening of fibromyalgia- she said that she fell while she was on vacation because she was walking so much- she fell on her right knee- she said that it just feels bruised but she is able to walk without increasing difficulty. She denies any problems with her asthma- has not needed albuterol in awhile. Her pain from fibromyalgia is about the same the cymbalta stll working okay- sh eiis never completely free from pain.  Patient Active Problem List   Diagnosis Date Noted  . GERD (gastroesophageal reflux disease) 10/05/2012  . Hyperlipidemia 10/05/2012  . Fibromyalgia 10/05/2012  . CVA (cerebral infarction) 10/05/2012  . Depression 10/05/2012  . Asthma, chronic 10/05/2012  . Hypertension 10/05/2012  . Urinary urgency 10/05/2012  . Insomnia 10/05/2012   Outpatient Encounter Prescriptions as of 01/15/2014  Medication Sig  . albuterol (PROVENTIL HFA;VENTOLIN HFA) 108 (90 BASE) MCG/ACT inhaler Inhale 2 puffs into the lungs every 6 (six) hours as needed for wheezing.  . AMRIX 15 MG 24 hr capsule TAKE 2 CAPSULES AT BEDTIME  . atorvastatin (LIPITOR) 40 MG tablet Take 40 mg by mouth daily.  . DULoxetine (CYMBALTA) 60 MG capsule Take 60 mg by mouth daily.    [name] eletriptan (RELPAX) 20 MG tablet One tablet by mouth at onset of headache. May repeat in 2 hours if headache persists or recurs. may repeat in 2 hours if necessary  . EPINEPHrine 0.3 mg/0.3 mL IJ SOAJ injection Inject 0.3 mLs (0.3 mg total) into the muscle once.  . fesoterodine (TOVIAZ) 8 MG TB24 Take 8 mg by mouth daily.  [name] gabapentin (NEURONTIN) 300 MG capsule TAKE 2 CAPSULES THREE TIMES A DAY  . lisinopril-hydrochlorothiazide (PRINZIDE,ZESTORETIC) 20-25 MG per tablet TAKE 1 TABLET DAILY  . pramipexole (MIRAPEX) 1 MG tablet Take 1 mg by mouth 2 (two) times  daily.  [name] topiramate (TOPAMAX) 50 MG tablet TAKE 1 TABLET THREE TIMES A DAY  . traMADol (ULTRAM) 50 MG tablet Take 50 mg by mouth 3 (three) times daily as needed.   . zolpidem (AMBIEN) 10 MG tablet TAKE ONE TABLET BY MOUTH ONCE DAILY AT BEDTIME      Review of Systems  Constitutional: Negative.   HENT: Negative.   Respiratory: Negative.   Cardiovascular: Negative.   Neurological: Negative.   Psychiatric/Behavioral: Negative.   All other systems reviewed and are negative.      Objective:   Physical Exam  Constitutional: She is oriented to person, place, and time. She appears well-developed and well-nourished.  HENT:  Nose: Nose normal.  Mouth/Throat: Oropharynx is clear and moist.  Eyes: EOM are normal.  Neck: Trachea normal, normal range of motion and full passive range of motion without pain. Neck supple. No JVD present. Carotid bruit is not present. No thyromegaly present.  Cardiovascular: Normal rate, regular rhythm, normal heart sounds and intact distal pulses.  Exam reveals no gallop and no friction rub.   No murmur heard. Pulmonary/Chest: Effort normal and breath sounds normal.  Abdominal: Soft. Bowel sounds are normal. She exhibits no distension and no mass. There is no tenderness.  Musculoskeletal: Normal range of motion. She exhibits edema (1+ bil lower ext).  Lymphadenopathy:    She has no cervical adenopathy.  Neurological: She is alert and oriented to person, place, and time. She has normal  reflexes.  Skin: Skin is warm and dry.  Psychiatric: She has a normal mood and affect. Her behavior is normal. Judgment and thought content normal.    BP 146/100  Pulse 85  Temp(Src) 97.1 F (36.2 C) (Oral)  Ht 5' 1"  (1.549 m)  Wt 265 lb (120.203 kg)  BMI 50.10 kg/m2       Assessment & Plan:   1. Urinary urgency   2. Insomnia   3. Essential hypertension   4. Hyperlipidemia   5. Gastroesophageal reflux disease without esophagitis   6. Fibromyalgia   7. Depression    8. 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 Asthma, chronic, mild intermittent, uncomplicated   10. Severe obesity (BMI >= 40)   11. Weight loss counseling, encounter for   12. RLS (restless legs syndrome)   13. Intractable migraine with status migrainosus, unspecified migraine type    Orders Placed This Encounter  Procedures  . CMP14+EGFR  . NMR, lipoprofile   Meds ordered this encounter  Medications  . zolpidem (AMBIEN) 10 MG tablet    Sig: TAKE ONE TABLET BY MOUTH ONCE DAILY AT BEDTIME    Dispense:  30 tablet    Refill:  1    Order Specific Question:  Supervising Provider    Answer:  Chipper Herb [[id number]]  . traMADol (ULTRAM) 50 MG tablet    Sig: Take 1 tablet (50 mg total) by mouth 3 (three) times daily as needed.    Dispense:  90 tablet    Refill:  1    Order Specific Question:  Supervising Provider    Answer:  Chipper Herb [[id number]]  . topiramate (TOPAMAX) 50 MG tablet    Sig: TAKE 1 TABLET THREE TIMES A DAY    Dispense:  270 tablet    Refill:  1    Order Specific Question:  Supervising Provider    Answer:  Chipper Herb [[id number]]  . pramipexole (MIRAPEX) 1 MG tablet    Sig: Take 1 tablet (1 mg total) by mouth 2 (two) times daily.    Dispense:  180 tablet    Refill:  1    Order Specific Question:  Supervising Provider    Answer:  Chipper Herb [[id number]]  . lisinopril-hydrochlorothiazide (PRINZIDE,ZESTORETIC) 20-25 MG per tablet    Sig: TAKE 1 TABLET DAILY    Dispense:  90 tablet    Refill:  1    Order Specific Question:  Supervising Provider    Answer:  Chipper Herb [[id number]]  . gabapentin (NEURONTIN) 300 MG capsule    Sig: TAKE 2 CAPSULES THREE TIMES A DAY    Dispense:  540 capsule    Refill:  1    Order Specific Question:  Supervising Provider    Answer:  Chipper Herb [[id number]]  . fesoterodine (TOVIAZ) 8 MG TB24 tablet    Sig: Take 1 tablet (8 mg total) by mouth daily.    Dispense:  90 tablet    Refill:  5    Order Specific Question:   Supervising Provider    Answer:  Chipper Herb [[id number]]  . eletriptan (RELPAX) 20 MG tablet    Sig: Take 2 tablets (40 mg total) by mouth as needed. may repeat in 2 hours if necessary    Dispense:  10 tablet    Refill:  5    Order Specific Question:  Supervising Provider    Answer:  Chipper Herb [[id number]]  . DULoxetine (CYMBALTA) 60  MG capsule    Sig: Take 1 capsule (60 mg total) by mouth daily.    Dispense:  30 capsule    Refill:  5    Order Specific Question:  Supervising Provider    Answer:  Chipper Herb [[id number]]  . atorvastatin (LIPITOR) 40 MG tablet    Sig: Take 1 tablet (40 mg total) by mouth daily.    Dispense:  90 tablet    Refill:  1    Order Specific Question:  Supervising Provider    Answer:  Chipper Herb [[id number]]  . cyclobenzaprine (AMRIX) 15 MG 24 hr capsule    Sig: TAKE 2 CAPSULES AT BEDTIME    Dispense:  180 capsule    Refill:  1    Order Specific Question:  Supervising Provider    Answer:  Chipper Herb [[id number]]    Labs pending Health maintenance reviewed Diet and exercise encouraged Continue all meds Follow up  In 6 month   [place], FNP

## 2014-01-15 NOTE — Patient Instructions (Signed)

## 2014-01-16 LAB — NMR, LIPOPROFILE
Cholesterol: 214 mg/dL — ABNORMAL HIGH (ref 100–199)
HDL Cholesterol by NMR: 55 mg/dL (ref >39)
HDL Particle Number: 32.7 umol/L (ref >=30.5)
LDL Particle Number: 1734 nmol/L — ABNORMAL HIGH (ref <1000)
LDL SIZE: 21.3 nm (ref >20.5)
LDLC SERPL CALC-MCNC: 142 mg/dL — ABNORMAL HIGH (ref 0–99)
LP-IR Score: 36 (ref <=45)
Small LDL Particle Number: 607 nmol/L — ABNORMAL HIGH (ref <=527)
Triglycerides by NMR: 83 mg/dL (ref 0–149)

## 2014-01-16 LAB — CMP14+EGFR
ALT: 28 IU/L (ref 0–32)
AST: 19 IU/L (ref 0–40)
Albumin/Globulin Ratio: 1.7 (ref 1.1–2.5)
Albumin: 4.5 g/dL (ref 3.5–5.5)
Alkaline Phosphatase: 128 IU/L — ABNORMAL HIGH (ref 39–117)
BILIRUBIN TOTAL: 0.3 mg/dL (ref 0.0–1.2)
BUN / CREAT RATIO: 14 (ref 9–23)
BUN: 14 mg/dL (ref 6–24)
CO2: 24 mmol/L (ref 18–29)
Calcium: 9.5 mg/dL (ref 8.7–10.2)
Chloride: 102 mmol/L (ref 97–108)
Creatinine, Ser: 0.97 mg/dL (ref 0.57–1.00)
GFR calc non Af Amer: 66 mL/min/{1.73_m2} (ref >59)
GFR, EST AFRICAN AMERICAN: 77 mL/min/{1.73_m2} (ref >59)
GLUCOSE: 102 mg/dL — AB (ref 65–99)
Globulin, Total: 2.7 g/dL (ref 1.5–4.5)
POTASSIUM: 4.7 mmol/L (ref 3.5–5.2)
Sodium: 142 mmol/L (ref 134–144)
Total Protein: 7.2 g/dL (ref 6.0–8.5)

## 2014-03-23 ENCOUNTER — Other Ambulatory Visit: Payer: Self-pay | Admitting: Nurse Practitioner

## 2014-03-26 ENCOUNTER — Other Ambulatory Visit: Payer: Self-pay | Admitting: *Deleted

## 2014-03-26 ENCOUNTER — Telehealth: Payer: Self-pay | Admitting: Nurse Practitioner

## 2014-03-26 NOTE — Telephone Encounter (Signed)
Done this morning 

## 2014-03-26 NOTE — Telephone Encounter (Signed)
Last seen 01/15/14 MMM If approved route to nurse to call into [facility]

## 2014-03-26 NOTE — Telephone Encounter (Signed)
Please call in ambien with 1 refills 

## 2014-03-26 NOTE — Telephone Encounter (Signed)
Ambien refills called in.

## 2014-04-19 ENCOUNTER — Ambulatory Visit (INDEPENDENT_AMBULATORY_CARE_PROVIDER_SITE_OTHER): Admitting: Nurse Practitioner

## 2014-04-19 ENCOUNTER — Encounter: Payer: Self-pay | Admitting: Nurse Practitioner

## 2014-04-19 VITALS — BP 141/105 | HR 96 | Temp 97.3°F

## 2014-04-19 DIAGNOSIS — J4520 Mild intermittent asthma, uncomplicated: Secondary | ICD-10-CM

## 2014-04-19 DIAGNOSIS — F32A Depression, unspecified: Secondary | ICD-10-CM

## 2014-04-19 DIAGNOSIS — E785 Hyperlipidemia, unspecified: Secondary | ICD-10-CM

## 2014-04-19 DIAGNOSIS — I10 Essential (primary) hypertension: Secondary | ICD-10-CM

## 2014-04-19 DIAGNOSIS — I6350 Cerebral infarction due to unspecified occlusion or stenosis of unspecified cerebral artery: Secondary | ICD-10-CM

## 2014-04-19 DIAGNOSIS — G2581 Restless legs syndrome: Secondary | ICD-10-CM

## 2014-04-19 DIAGNOSIS — F329 Major depressive disorder, single episode, unspecified: Secondary | ICD-10-CM

## 2014-04-19 DIAGNOSIS — K219 Gastro-esophageal reflux disease without esophagitis: Secondary | ICD-10-CM

## 2014-04-19 DIAGNOSIS — M797 Fibromyalgia: Secondary | ICD-10-CM

## 2014-04-19 DIAGNOSIS — G4700 Insomnia, unspecified: Secondary | ICD-10-CM

## 2014-04-19 NOTE — Progress Notes (Signed)
Subjective:    Patient ID: Marie Carter, female    DOB: 01/28/1961, 53 y.o.   MRN: [id number]  HPI Comments: Patient here toay for follow up of chronic medical problems. Patient says that she is about the same today- no new complaints. She has bilateral knee problems which seems to be worseningPACCAR Inc will not pay for surgery right now. She sees ortho in [place] for this.  Patient Active Problem List   RLS (restless legs syndrome)        Date Noted: 01/15/2014    GERD (gastroesophageal reflux disease)        Date Noted: 10/05/2012    Hyperlipidemia        Date Noted: 10/05/2012    Fibromyalgia        Date Noted: 10/05/2012    CVA (cerebral infarction)        Date Noted: 10/05/2012    Depression        Date Noted: 10/05/2012    Asthma, chronic        Date Noted: 10/05/2012    Hypertension        Date Noted: 10/05/2012    Urinary urgency        Date Noted: 10/05/2012    Insomnia        Date Noted: 10/05/2012   Outpatient Encounter Prescriptions as of 04/19/2014: albuterol (PROVENTIL HFA;VENTOLIN HFA) 108 (90 BASE) MCG/ACT inhaler, Inhale 2 puffs into the lungs every 6 (six) hours as needed for wheezing. atorvastatin (LIPITOR) 40 MG tablet, Take 1 tablet (40 mg total) by mouth daily. cyclobenzaprine (AMRIX) 15 MG 24 hr capsule, TAKE 2 CAPSULES AT BEDTIME DULoxetine (CYMBALTA) 60 MG capsule, Take 1 capsule (60 mg total) by mouth daily. eletriptan (RELPAX) 20 MG tablet, Take 2 tablets (40 mg total) by mouth as needed. may repeat in 2 hours if necessary EPINEPHrine 0.3 mg/0.3 mL IJ SOAJ injection, Inject 0.3 mLs (0.3 mg total) into the muscle once. fesoterodine (TOVIAZ) 8 MG TB24 tablet, Take 1 tablet (8 mg total) by mouth daily. gabapentin (NEURONTIN) 300 MG capsule, TAKE 2 CAPSULES THREE TIMES A DAY lisinopril-hydrochlorothiazide (PRINZIDE,ZESTORETIC) 20-25 MG per tablet, TAKE 1 TABLET DAILY pramipexole (MIRAPEX) 1 MG tablet, Take 1 tablet (1 mg total) by mouth 2  (two) times daily. topiramate (TOPAMAX) 50 MG tablet, TAKE 1 TABLET THREE TIMES A DAY traMADol (ULTRAM) 50 MG tablet, Take 1 tablet (50 mg total) by mouth 3 (three) times daily as needed. zolpidem (AMBIEN) 10 MG tablet, TAKE ONE TABLET BY MOUTH ONCE DAILY AT BEDTIME        Review of Systems  HENT: Negative.   Respiratory: Negative.   Cardiovascular: Negative.   Gastrointestinal: Negative.   Genitourinary: Negative.   Musculoskeletal: Positive for myalgias and arthralgias.  Neurological: Positive for dizziness and weakness.  Psychiatric/Behavioral: Negative.   All other systems reviewed and are negative.      Objective:   Physical Exam  Constitutional: She is oriented to person, place, and time. She appears well-developed and well-nourished.  HENT:  Head: Normocephalic.  Right Ear: Hearing, tympanic membrane, external ear and ear canal normal.  Left Ear: Hearing, tympanic membrane, external ear and ear canal normal.  Nose: Nose normal.  Mouth/Throat: Uvula is midline, oropharynx is clear and moist and mucous membranes are normal.  Eyes: Conjunctivae and EOM are normal. Pupils are equal, round, and reactive to light.  Neck: Normal range of motion. Neck supple. No JVD present. No thyromegaly present.  Cardiovascular: Normal rate, normal heart sounds  and intact distal pulses.   No murmur heard. Pulmonary/Chest: Effort normal and breath sounds normal. She has no wheezes. She has no rales.  Abdominal: Soft. Bowel sounds are normal. She exhibits no mass.  Musculoskeletal: Normal range of motion.  Walking with cane today. FROM of bil knees with pain on full flexion- crepitus felt bil. No effusion bil  Neurological: She is alert and oriented to person, place, and time. She has normal reflexes.  Skin: Skin is warm and dry.  Psychiatric: She has a normal mood and affect. Her behavior is normal. Judgment and thought content normal.    BP 141/105 mmHg  Pulse 96  Temp(Src) 97.3 F  (36.3 C) (Oral)  Wt        Assessment & Plan:   1. Essential hypertension   2. Asthma, chronic, mild intermittent, uncomplicated   3. Gastroesophageal reflux disease without esophagitis   4. 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 Fibromyalgia   6. Hyperlipidemia   7. Depression   8. Insomnia   9. RLS (restless legs syndrome)    Orders Placed This Encounter  Procedures  . NMR, lipoprofile  . CMP14+EGFR   Continuue all meds Labs pending Health maintenance reviewed  [place], FNP

## 2014-04-19 NOTE — Patient Instructions (Signed)

## 2014-04-20 LAB — CMP14+EGFR
ALBUMIN: 4.5 g/dL (ref 3.5–5.5)
ALT: 48 IU/L — ABNORMAL HIGH (ref 0–32)
AST: 35 IU/L (ref 0–40)
Albumin/Globulin Ratio: 1.7 (ref 1.1–2.5)
Alkaline Phosphatase: 146 IU/L — ABNORMAL HIGH (ref 39–117)
BILIRUBIN TOTAL: 0.4 mg/dL (ref 0.0–1.2)
BUN/Creatinine Ratio: 19 (ref 9–23)
BUN: 25 mg/dL — AB (ref 6–24)
CHLORIDE: 100 mmol/L (ref 97–108)
CO2: 25 mmol/L (ref 18–29)
CREATININE: 1.3 mg/dL — AB (ref 0.57–1.00)
Calcium: 9.7 mg/dL (ref 8.7–10.2)
GFR calc non Af Amer: 47 mL/min/{1.73_m2} — ABNORMAL LOW (ref >59)
GFR, EST AFRICAN AMERICAN: 54 mL/min/{1.73_m2} — AB (ref >59)
GLOBULIN, TOTAL: 2.6 g/dL (ref 1.5–4.5)
GLUCOSE: 94 mg/dL (ref 65–99)
Potassium: 4.3 mmol/L (ref 3.5–5.2)
Sodium: 143 mmol/L (ref 134–144)
TOTAL PROTEIN: 7.1 g/dL (ref 6.0–8.5)

## 2014-04-20 LAB — NMR, LIPOPROFILE
Cholesterol: 234 mg/dL — ABNORMAL HIGH (ref 100–199)
HDL Cholesterol by NMR: 60 mg/dL (ref >39)
HDL Particle Number: 30.4 umol/L — ABNORMAL LOW (ref >=30.5)
LDL PARTICLE NUMBER: 1639 nmol/L — AB (ref <1000)
LDL Size: 21.7 nm (ref >20.5)
LDL-C: 151 mg/dL — AB (ref 0–99)
LP-IR Score: <25 (ref <=45)
Small LDL Particle Number: 382 nmol/L (ref <=527)
Triglycerides by NMR: 116 mg/dL (ref 0–149)

## 2014-05-28 ENCOUNTER — Other Ambulatory Visit: Payer: Self-pay | Admitting: Nurse Practitioner

## 2014-05-29 ENCOUNTER — Telehealth: Payer: Self-pay | Admitting: *Deleted

## 2014-05-29 NOTE — Telephone Encounter (Signed)
ambein called to [facility].

## 2014-05-29 NOTE — Telephone Encounter (Signed)
Last seen 04/12/14, last [facility]30/14[facility]

## 2014-05-29 NOTE — Telephone Encounter (Signed)
Please call in ambien with 1 refills 

## 2014-06-07 ENCOUNTER — Other Ambulatory Visit: Payer: Self-pay | Admitting: Nurse Practitioner

## 2014-06-11 ENCOUNTER — Encounter: Payer: Self-pay | Admitting: Family Medicine

## 2014-06-11 ENCOUNTER — Ambulatory Visit (INDEPENDENT_AMBULATORY_CARE_PROVIDER_SITE_OTHER): Admitting: Family Medicine

## 2014-06-11 VITALS — BP 140/99 | HR 91 | Temp 97.9°F | Ht 61.0 in | Wt 259.0 lb

## 2014-06-11 DIAGNOSIS — J206 Acute bronchitis due to rhinovirus: Secondary | ICD-10-CM

## 2014-06-11 MED ORDER — ONDANSETRON 8 MG PO TBDP: 8.0000 mg | ORAL_TABLET | Freq: Three times a day (TID) | ORAL | Status: DC | PRN

## 2014-06-11 MED ORDER — METHYLPREDNISOLONE ACETATE 80 MG/ML IJ SUSP
80.0000 mg | Freq: Once | INTRAMUSCULAR | Status: AC
  Administered 2014-06-11: 80 mg via INTRAMUSCULAR

## 2014-06-11 MED ORDER — BENZONATATE 100 MG PO CAPS: 200.0000 mg | ORAL_CAPSULE | Freq: Three times a day (TID) | ORAL | Status: DC | PRN

## 2014-06-11 MED ORDER — AMOXICILLIN 875 MG PO TABS: 875.0000 mg | ORAL_TABLET | Freq: Two times a day (BID) | ORAL | Status: DC

## 2014-06-11 MED ORDER — METHYLPREDNISOLONE (PAK) 4 MG PO TABS: ORAL_TABLET | ORAL | Status: DC

## 2014-06-11 MED ORDER — CYCLOBENZAPRINE HCL ER 15 MG PO CP24: ORAL_CAPSULE | ORAL | Status: DC

## 2014-06-11 MED ORDER — LEVALBUTEROL HCL 1.25 MG/0.5ML IN NEBU
1.2500 mg | INHALATION_SOLUTION | Freq: Once | RESPIRATORY_TRACT | Status: AC
  Administered 2014-06-11: 1.25 mg via RESPIRATORY_TRACT

## 2014-06-11 NOTE — Progress Notes (Signed)
   Subjective:    Patient ID: Marie Carter, female    DOB: 1960-10-06, 54 y.o.   MRN: [id number]  HPI Patient is here with c/o cough and URI sx's.  She is having severe cough.  She has hx of asthma.  Review of Systems  Constitutional: Negative for fever.  HENT: Negative for ear pain.   Eyes: Negative for discharge.  Respiratory: Negative for cough.   Cardiovascular: Negative for chest pain.  Gastrointestinal: Negative for abdominal distention.  Endocrine: Negative for polyuria.  Genitourinary: Negative for difficulty urinating.  Musculoskeletal: Negative for gait problem and neck pain.  Skin: Negative for color change and rash.  Neurological: Negative for speech difficulty and headaches.  Psychiatric/Behavioral: Negative for agitation.       Objective:    BP 140/99 mmHg  Pulse 91  Temp(Src) 97.9 F (36.6 C) (Oral)  Ht 5\' 1"  (1.549 m)  Wt 259 lb (117.482 kg)  BMI 48.96 kg/m2 Physical Exam  Constitutional: She is oriented to person, place, and time. She appears well-developed and well-nourished.  HENT:  Head: Normocephalic and atraumatic.  Mouth/Throat: Oropharynx is clear and moist.  Eyes: Pupils are equal, round, and reactive to light.  Neck: Normal range of motion. Neck supple.  Cardiovascular: Normal rate and regular rhythm.   No murmur heard. Pulmonary/Chest: Effort normal. She has wheezes.  Abdominal: Soft. Bowel sounds are normal. There is no tenderness.  Neurological: She is alert and oriented to person, place, and time.  Skin: Skin is warm and dry.  Psychiatric: She has a normal mood and affect.          Assessment & Plan:     ICD-9-CM ICD-10-CM   1. Acute bronchitis due to Rhinovirus 466.0 J20.6 methylPREDNISolone acetate (DEPO-MEDROL) injection 80 mg   079.3  levalbuterol (XOPENEX) nebulizer solution 1.25 mg     ondansetron (ZOFRAN ODT) 8 MG disintegrating tablet     amoxicillin (AMOXIL) 875 MG tablet     methylPREDNIsolone (MEDROL DOSPACK) 4 MG  tablet     benzonatate (TESSALON PERLES) 100 MG capsule    Push po fluids, rest, tylenol and motrin otc prn as directed for fever, arthralgias, and myalgias.  Follow up prn if sx's continue or persist. No Follow-up on file.  Deatra CanterWilliam J Korinne Greenstein FNP

## 2014-06-11 NOTE — Addendum Note (Signed)
Addended by: Fawn KirkHOLT, CATHY on: 06/11/2014 12:15 PM   Modules accepted: Orders

## 2014-06-26 ENCOUNTER — Other Ambulatory Visit: Payer: Self-pay | Admitting: Nurse Practitioner

## 2014-07-23 ENCOUNTER — Ambulatory Visit (INDEPENDENT_AMBULATORY_CARE_PROVIDER_SITE_OTHER): Admitting: Nurse Practitioner

## 2014-07-23 ENCOUNTER — Encounter: Payer: Self-pay | Admitting: Nurse Practitioner

## 2014-07-23 VITALS — BP 147/90 | HR 98 | Temp 97.2°F | Ht 61.0 in | Wt 261.0 lb

## 2014-07-23 DIAGNOSIS — M797 Fibromyalgia: Secondary | ICD-10-CM

## 2014-07-23 DIAGNOSIS — F32A Depression, unspecified: Secondary | ICD-10-CM

## 2014-07-23 DIAGNOSIS — R3915 Urgency of urination: Secondary | ICD-10-CM

## 2014-07-23 DIAGNOSIS — G43011 Migraine without aura, intractable, with status migrainosus: Secondary | ICD-10-CM

## 2014-07-23 DIAGNOSIS — I10 Essential (primary) hypertension: Secondary | ICD-10-CM

## 2014-07-23 DIAGNOSIS — Z01419 Encounter for gynecological examination (general) (routine) without abnormal findings: Secondary | ICD-10-CM

## 2014-07-23 DIAGNOSIS — G4700 Insomnia, unspecified: Secondary | ICD-10-CM

## 2014-07-23 DIAGNOSIS — Z0000 Encounter for general adult medical examination without abnormal findings: Secondary | ICD-10-CM

## 2014-07-23 DIAGNOSIS — F329 Major depressive disorder, single episode, unspecified: Secondary | ICD-10-CM

## 2014-07-23 DIAGNOSIS — G2581 Restless legs syndrome: Secondary | ICD-10-CM

## 2014-07-23 DIAGNOSIS — E785 Hyperlipidemia, unspecified: Secondary | ICD-10-CM

## 2014-07-23 DIAGNOSIS — K219 Gastro-esophageal reflux disease without esophagitis: Secondary | ICD-10-CM

## 2014-07-23 LAB — POCT URINALYSIS DIPSTICK
Bilirubin, UA: NEGATIVE
Glucose, UA: NEGATIVE
Ketones, UA: NEGATIVE
LEUKOCYTES UA: NEGATIVE
Nitrite, UA: NEGATIVE
PH UA: 6
Protein, UA: NEGATIVE
Spec Grav, UA: 1.015
UROBILINOGEN UA: NEGATIVE

## 2014-07-23 LAB — POCT UA - MICROSCOPIC ONLY
Bacteria, U Microscopic: NEGATIVE
CASTS, UR, LPF, POC: NEGATIVE
Crystals, Ur, HPF, POC: NEGATIVE
Mucus, UA: NEGATIVE
WBC, Ur, HPF, POC: NEGATIVE
Yeast, UA: NEGATIVE

## 2014-07-23 LAB — POCT CBC
GRANULOCYTE PERCENT: 62.1 % (ref 37–80)
HEMATOCRIT: 42.5 % (ref 37.7–47.9)
Hemoglobin: 13.4 g/dL (ref 12.2–16.2)
Lymph, poc: 2.2 (ref 0.6–3.4)
MCH, POC: 27.9 pg (ref 27–31.2)
MCHC: 31.5 g/dL — AB (ref 31.8–35.4)
MCV: 88.6 fL (ref 80–97)
MPV: 7.6 fL (ref 0–99.8)
POC GRANULOCYTE: 4.3 (ref 2–6.9)
POC LYMPH PERCENT: 32.3 %L (ref 10–50)
Platelet Count, POC: 343 10*3/uL (ref 142–424)
RBC: 4.79 M/uL (ref 4.04–5.48)
RDW, POC: 13.9 %
WBC: 6.9 10*3/uL (ref 4.6–10.2)

## 2014-07-23 MED ORDER — FESOTERODINE FUMARATE ER 8 MG PO TB24: 8.0000 mg | ORAL_TABLET | Freq: Every day | ORAL | Status: DC

## 2014-07-23 MED ORDER — GABAPENTIN 300 MG PO CAPS: ORAL_CAPSULE | ORAL | Status: DC

## 2014-07-23 MED ORDER — DULOXETINE HCL 60 MG PO CPEP: 60.0000 mg | ORAL_CAPSULE | Freq: Every day | ORAL | Status: DC

## 2014-07-23 MED ORDER — TOPIRAMATE 50 MG PO TABS: 50.0000 mg | ORAL_TABLET | Freq: Three times a day (TID) | ORAL | Status: DC

## 2014-07-23 MED ORDER — PRAMIPEXOLE DIHYDROCHLORIDE 1 MG PO TABS: 1.0000 mg | ORAL_TABLET | Freq: Two times a day (BID) | ORAL | Status: DC

## 2014-07-23 MED ORDER — ATORVASTATIN CALCIUM 40 MG PO TABS: 40.0000 mg | ORAL_TABLET | Freq: Every day | ORAL | Status: DC

## 2014-07-23 MED ORDER — LISINOPRIL-HYDROCHLOROTHIAZIDE 20-25 MG PO TABS: ORAL_TABLET | ORAL | Status: DC

## 2014-07-23 NOTE — Addendum Note (Signed)
Addended by: Prescott GumLAND, Lasalle Abee M on: 07/23/2014 11:46 AM   Modules accepted: Kipp BroodSmartSet

## 2014-07-23 NOTE — Addendum Note (Signed)
Addended by: Prescott GumLAND, Antoine Fiallos M on: 07/23/2014 11:49 AM   Modules accepted: Kipp BroodSmartSet

## 2014-07-23 NOTE — Progress Notes (Signed)
Subjective:    Patient ID: Marie Carter, female    DOB: Apr 26, 1961, 54 y.o.   MRN: [id number]   Patient here today for follow up of chronic medical problems. Also here for PAP today.   Hypertension This is a chronic problem. The current episode started more than 1 year ago. The problem is unchanged. The problem is controlled. There are no associated agents to hypertension. Risk factors for coronary artery disease include dyslipidemia, obesity, post-menopausal state and sedentary lifestyle. Past treatments include ACE inhibitors and diuretics. The current treatment provides moderate improvement. There are no compliance problems.   Hyperlipidemia This is a chronic problem. The current episode started more than 1 year ago. The problem is uncontrolled. Recent lipid tests were reviewed and are variable. Current antihyperlipidemic treatment includes statins. The current treatment provides moderate improvement of lipids. Compliance problems include adherence to diet and adherence to exercise.  Risk factors for coronary artery disease include dyslipidemia, hypertension, obesity and post-menopausal.  fibromyalgia  neurotin working well along with cymbalta- still has some pain- takes ultram which helps Migraines topamax helps keep them from occurring so often- still has 1-2 a month Urinary urgency [name] works well- still has occassional symptoms insomnia ambien working well- feels rested in AM   Review of Systems  Constitutional: Negative.   HENT: Negative.   Respiratory: Negative.   Cardiovascular: Negative.   Genitourinary: Negative.   Neurological: Negative.   Psychiatric/Behavioral: Negative.   All other systems reviewed and are negative.      Objective:   Physical Exam  Constitutional: She is oriented to person, place, and time. She appears well-developed and well-nourished.  HENT:  Head: Normocephalic.  Right Ear: Hearing, tympanic membrane, external ear and ear canal normal.    Left Ear: Hearing, tympanic membrane, external ear and ear canal normal.  Nose: Nose normal.  Mouth/Throat: Uvula is midline and oropharynx is clear and moist.  Eyes: Conjunctivae and EOM are normal. Pupils are equal, round, and reactive to light.  Neck: Normal range of motion and full passive range of motion without pain. Neck supple. No JVD present. Carotid bruit is not present. No thyroid mass and no thyromegaly present.  Cardiovascular: Normal rate, normal heart sounds and intact distal pulses.   No murmur heard. Pulmonary/Chest: Effort normal and breath sounds normal. Right breast exhibits no inverted nipple, no mass, no nipple discharge, no skin change and no tenderness. Left breast exhibits no inverted nipple, no mass, no nipple discharge, no skin change and no tenderness.  Abdominal: Soft. Bowel sounds are normal. She exhibits no mass. There is no tenderness.  Genitourinary: Vagina normal and uterus normal. No breast swelling, tenderness, discharge or bleeding.  bimanual exam-No adnexal masses or tenderness. Cervix parous and pink no discharge  Musculoskeletal: Normal range of motion.  Lymphadenopathy:    She has no cervical adenopathy.  Neurological: She is alert and oriented to person, place, and time.  Skin: Skin is warm and dry.  Psychiatric: She has a normal mood and affect. Her behavior is normal. Judgment and thought content normal.    BP 147/90 mmHg  Pulse 98  Temp(Src) 97.2 F (36.2 C) (Oral)  Ht 5' 1"  (1.549 m)  Wt 261 lb (118.389 kg)  BMI 49.34 kg/m2        Assessment & Plan:  1. Annual physical exam - POCT UA - Microscopic Only - POCT urinalysis dipstick - POCT CBC - Thyroid Panel With TSH - Vit D  25 hydroxy (rtn osteoporosis monitoring)  2. Encounter for routine gynecological examination - Pap IG w/ reflex to HPV when ASC-U  3. Hyperlipidemia Low fat diet - NMR, lipoprofile - atorvastatin (LIPITOR) 40 MG tablet; Take 1 tablet (40 mg total) by  mouth daily.  Dispense: 90 tablet; Refill: 1  4. Essential hypertension Do not add salt to diet - CMP14+EGFR - lisinopril-hydrochlorothiazide (PRINZIDE,ZESTORETIC) 20-25 MG per tablet; TAKE 1 TABLET DAILY  Dispense: 90 tablet; Refill: 1  5. Gastroesophageal reflux disease without esophagitis Avoid spicy foods Do  Not eat 2 hours prior to bedtime  6. Urinary urgency - fesoterodine (TOVIAZ) 8 MG TB24 tablet; Take 1 tablet (8 mg total) by mouth daily.  Dispense: 90 tablet; Refill: 1  7. Insomnia Bedtime ritual  8. Depression Stress management  9. Severe obesity (BMI >= 40) Discussed diet and exercise for person with BMI >25 Will recheck weight in 3-6 months   10. Fibromyalgia Exercise to keep muscles warm - DULoxetine (CYMBALTA) 60 MG capsule; Take 1 capsule (60 mg total) by mouth daily.  Dispense: 90 capsule; Refill: 1 - gabapentin (NEURONTIN) 300 MG capsule; TAKE 2 CAPSULES THREE TIMES A DAY  Dispense: 540 capsule; Refill: 1  11. Intractable migraine without aura and with status migrainosus Avoid caffeine - topiramate (TOPAMAX) 50 MG tablet; Take 1 tablet (50 mg total) by mouth 3 (three) times daily.  Dispense: 270 tablet; Refill: 1  12. RLS (restless legs syndrome) Keep legs warm - pramipexole (MIRAPEX) 1 MG tablet; Take 1 tablet (1 mg total) by mouth 2 (two) times daily.  Dispense: 180 tablet; Refill: 1    Labs pending Health maintenance reviewed Diet and exercise encouraged Continue all meds Follow up  In 6 months   [place], FNP

## 2014-07-23 NOTE — Patient Instructions (Signed)

## 2014-07-24 ENCOUNTER — Encounter: Payer: Self-pay | Admitting: Nurse Practitioner

## 2014-07-24 LAB — CMP14+EGFR
A/G RATIO: 1.5 (ref 1.1–2.5)
ALT: 29 IU/L (ref 0–32)
AST: 18 IU/L (ref 0–40)
Albumin: 4.1 g/dL (ref 3.5–5.5)
Alkaline Phosphatase: 109 IU/L (ref 39–117)
BUN/Creatinine Ratio: 19 (ref 9–23)
BUN: 21 mg/dL (ref 6–24)
Bilirubin Total: 0.3 mg/dL (ref 0.0–1.2)
CALCIUM: 8.9 mg/dL (ref 8.7–10.2)
CHLORIDE: 103 mmol/L (ref 97–108)
CO2: 23 mmol/L (ref 18–29)
CREATININE: 1.08 mg/dL — AB (ref 0.57–1.00)
GFR calc Af Amer: 67 mL/min/{1.73_m2} (ref >59)
GFR, EST NON AFRICAN AMERICAN: 58 mL/min/{1.73_m2} — AB (ref >59)
Globulin, Total: 2.7 g/dL (ref 1.5–4.5)
Glucose: 93 mg/dL (ref 65–99)
POTASSIUM: 4.1 mmol/L (ref 3.5–5.2)
SODIUM: 143 mmol/L (ref 134–144)
TOTAL PROTEIN: 6.8 g/dL (ref 6.0–8.5)

## 2014-07-24 LAB — THYROID PANEL WITH TSH
Free Thyroxine Index: 1.8 (ref 1.2–4.9)
T3 UPTAKE RATIO: 25 % (ref 24–39)
T4, Total: 7 ug/dL (ref 4.5–12.0)
TSH: 1.89 u[IU]/mL (ref 0.450–4.500)

## 2014-07-24 LAB — NMR, LIPOPROFILE
Cholesterol: 224 mg/dL — ABNORMAL HIGH (ref 100–199)
HDL CHOLESTEROL BY NMR: 52 mg/dL (ref >39)
HDL Particle Number: 27.6 umol/L — ABNORMAL LOW (ref >=30.5)
LDL Particle Number: 1746 nmol/L — ABNORMAL HIGH (ref <1000)
LDL Size: 21.5 nm (ref >20.5)
LDL-C: 149 mg/dL — ABNORMAL HIGH (ref 0–99)
LP-IR Score: 32 (ref <=45)
SMALL LDL PARTICLE NUMBER: 482 nmol/L (ref <=527)
TRIGLYCERIDES BY NMR: 117 mg/dL (ref 0–149)

## 2014-07-24 LAB — VITAMIN D 25 HYDROXY (VIT D DEFICIENCY, FRACTURES): VIT D 25 HYDROXY: 12.1 ng/mL — AB (ref 30.0–100.0)

## 2014-07-25 LAB — PAP IG W/ RFLX HPV ASCU: PAP Smear Comment: 0

## 2014-07-27 ENCOUNTER — Encounter: Payer: Self-pay | Admitting: Nurse Practitioner

## 2014-07-30 ENCOUNTER — Other Ambulatory Visit: Payer: Self-pay | Admitting: Nurse Practitioner

## 2014-07-31 ENCOUNTER — Other Ambulatory Visit: Payer: Self-pay | Admitting: Nurse Practitioner

## 2014-07-31 NOTE — Telephone Encounter (Signed)
Please call in ambien with 1 refills 

## 2014-07-31 NOTE — Telephone Encounter (Signed)
Last filled 06/29/14, last seen 07/23/14. [facility]

## 2014-07-31 NOTE — Telephone Encounter (Signed)
Last seen 07/23/14  MMM If approved route to nurse to call into [facility] 

## 2014-07-31 NOTE — Telephone Encounter (Signed)
Refill called to [facility] 

## 2014-08-01 NOTE — Telephone Encounter (Signed)
Left refill info on voicemail.

## 2014-09-10 ENCOUNTER — Telehealth: Payer: Self-pay | Admitting: Nurse Practitioner

## 2014-09-10 NOTE — Telephone Encounter (Signed)
Appointment scheduled for tomorrow at 10am with MMM.

## 2014-09-11 ENCOUNTER — Encounter: Payer: Self-pay | Admitting: Nurse Practitioner

## 2014-09-11 ENCOUNTER — Ambulatory Visit (INDEPENDENT_AMBULATORY_CARE_PROVIDER_SITE_OTHER): Admitting: Nurse Practitioner

## 2014-09-11 VITALS — BP 138/86 | HR 96 | Temp 97.3°F | Ht 61.0 in | Wt 260.0 lb

## 2014-09-11 DIAGNOSIS — R55 Syncope and collapse: Secondary | ICD-10-CM

## 2014-09-11 NOTE — Progress Notes (Signed)
   Subjective:    Patient ID: Marie Carter, female    DOB: April 12, 1961, 54 y.o.   MRN: [id number]  HPI The patient presents for a ER visit follow up on Sunday evening. The patient states she was speaking with her daughter on the phone and suddenly passed out-her husband who came to check on her stated she was speaking but not making sense. The patient reports she remembers having weakness of the left hand and left face numbness, a headache, and later, chest pressure. The [facility] her BP as 160/120- where she was evaluated and discharged to follow up with per PCP.      Review of Systems  Constitutional: Positive for fatigue.  Eyes: Positive for visual disturbance (On and off for past two weeks. ).  Respiratory: Positive for shortness of breath and wheezing (Relieved by albuterol). Negative for choking. Cough: Intermittent not related to activity.   Cardiovascular: Negative for chest pain and palpitations.  Gastrointestinal: Negative.  Negative for nausea, vomiting, abdominal pain and diarrhea.  Genitourinary: Negative.   Neurological: Positive for headaches (Intermittent generalized headaches for two weeks-states this feels different from a migraine). Negative for dizziness, seizures, speech difficulty, weakness and numbness.       She short term memory is decreased acutely in the last two weeks.   Psychiatric/Behavioral: Positive for decreased concentration and agitation. Negative for suicidal ideas, hallucinations, sleep disturbance and self-injury. The patient is nervous/anxious.   All other systems reviewed and are negative.      Objective:   Physical Exam  Constitutional: She is oriented to person, place, and time. She appears well-developed and well-nourished. No distress.  HENT:  Right Ear: External ear normal.  Left Ear: External ear normal.  Nose: Nose normal.  Mouth/Throat: Oropharynx is clear and moist. No oropharyngeal exudate.    Eyes: Conjunctivae and EOM are normal. Pupils are equal, round, and reactive to light. Right eye exhibits no discharge. Left eye exhibits no discharge.  Neck: Normal range of motion. Neck supple. No JVD present. No thyromegaly present.  Cardiovascular: Normal rate, regular rhythm, normal heart sounds and intact distal pulses.  Exam reveals no gallop and no friction rub.   No murmur heard. Pulmonary/Chest: Effort normal. No respiratory distress. She has no wheezes.  Abdominal: Soft. Bowel sounds are normal. She exhibits no distension. There is no tenderness.  Musculoskeletal: Normal range of motion.  Walking with cane but is normal for her   Lymphadenopathy:    She has no cervical adenopathy.  Neurological: She is alert and oriented to person, place, and time. She has normal strength and normal reflexes. She is not disoriented. No cranial nerve deficit or sensory deficit.  Skin: Skin is warm and dry.  Psychiatric: She has a normal mood and affect. Her behavior is normal. Judgment and thought content normal.  Vitals reviewed.  BP 138/86 mmHg  Pulse 96  Temp(Src) 97.3 F (36.3 C) (Oral)  Ht 5\' 1"  (1.549 m)  Wt 260 lb (117.935 kg)  BMI 49.15 kg/m2       Assessment & Plan:  1. Syncope and collapse Keep diary of events - Ambulatory referral to Neurology Hospital records reviewed  [facility]Marg[facility] HullMartin, FNP

## 2014-09-17 ENCOUNTER — Encounter: Payer: Self-pay | Admitting: Neurology

## 2014-09-17 ENCOUNTER — Ambulatory Visit (INDEPENDENT_AMBULATORY_CARE_PROVIDER_SITE_OTHER): Admitting: Neurology

## 2014-09-17 VITALS — BP 130/80 | HR 90 | Ht 61.0 in | Wt 255.2 lb

## 2014-09-17 DIAGNOSIS — G43009 Migraine without aura, not intractable, without status migrainosus: Secondary | ICD-10-CM | POA: Diagnosis not present

## 2014-09-17 DIAGNOSIS — R404 Transient alteration of awareness: Secondary | ICD-10-CM

## 2014-09-17 NOTE — Patient Instructions (Addendum)
To investigate further, we will get an MRI of the brain and EEG.  You will follow up afterwards For migraines, take 2 Relpax (total of 40mg ) at the earliest onset of the headache.  You may repeat 40mg  in 2 hours if needed.  Do not exceed 80mg  in 24 hours
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
## 2014-09-20 ENCOUNTER — Ambulatory Visit (INDEPENDENT_AMBULATORY_CARE_PROVIDER_SITE_OTHER): Admitting: Neurology

## 2014-09-20 DIAGNOSIS — G43009 Migraine without aura, not intractable, without status migrainosus: Secondary | ICD-10-CM

## 2014-09-20 DIAGNOSIS — R404 Transient alteration of awareness: Secondary | ICD-10-CM

## 2014-09-21 NOTE — Procedures (Signed)
ELECTROENCEPHALOGRAM REPORT  Date of Study: 09/20/2014  Patient's Name: Marie Carter MRN: [id number] Date of Birth: [birth year]  Indication: 54 year old woman with history of migraine, RLS, depression and fibromyalgia who presents with transient altered awareness and history of recurrent syncope.  Medications: Cymbalta Amrix Relpax Topiramate Gabapentin Ambien  Technical Summary: This is a multichannel digital EEG recording, using the international 10-20 placement system.  Spike detection software was employed.  Description: The EEG background is symmetric, with a well-developed posterior dominant rhythm of 11 Hz, which is reactive to eye opening and closing.  Diffuse beta activity is seen, with a bilateral frontal preponderance.  No focal or generalized abnormalities are seen.  No focal or generalized epileptiform discharges are seen.  Stage II sleep is seen, with normal and symmetric sleep patterns.  Hyperventilation and photic stimulation were performed, and produced no abnormalities.  ECG revealed normal cardiac rate and rhythm.  Impression: This is a normal routine EEG of the awake and asleep states, with activating procedures.  A normal study does not rule out the possibility of a seizure disorder in this patient.  Adam R. Everlena CooperJaffe, DO

## 2014-09-24 ENCOUNTER — Telehealth: Payer: Self-pay | Admitting: *Deleted

## 2014-09-24 ENCOUNTER — Ambulatory Visit (HOSPITAL_COMMUNITY)
Admission: RE | Admit: 2014-09-24 | Discharge: 2014-09-24 | Disposition: A | Discharge status: Home / Self Care | Source: Ambulatory Visit | Attending: Neurology | Admitting: Neurology

## 2014-09-24 DIAGNOSIS — E785 Hyperlipidemia, unspecified: Secondary | ICD-10-CM | POA: Diagnosis not present

## 2014-09-24 DIAGNOSIS — R404 Transient alteration of awareness: Secondary | ICD-10-CM

## 2014-09-24 DIAGNOSIS — I10 Essential (primary) hypertension: Secondary | ICD-10-CM | POA: Insufficient documentation

## 2014-09-24 DIAGNOSIS — G43009 Migraine without aura, not intractable, without status migrainosus: Secondary | ICD-10-CM

## 2014-09-24 DIAGNOSIS — R55 Syncope and collapse: Secondary | ICD-10-CM | POA: Insufficient documentation

## 2014-09-24 NOTE — Telephone Encounter (Signed)
-----   Message from Drema DallasAdam R Jaffe, DO sent at 09/21/2014 12:05 PM EDT ----- eeg is within normal limits ----- Message -----    From: Drema DallasAdam R Jaffe, DO    Sent: 09/21/2014  12:04 PM      To: Drema DallasAdam R Jaffe, DO

## 2014-09-24 NOTE — Telephone Encounter (Signed)
Patient is aware of eeg is within normal limits

## 2014-09-25 ENCOUNTER — Telehealth: Payer: Self-pay | Admitting: *Deleted

## 2014-09-25 NOTE — Telephone Encounter (Signed)
-----   Message from Drema DallasAdam R Jaffe, DO sent at 09/24/2014  7:21 PM EDT ----- MRI of the brain shows nothing alarming. ----- Message -----    From: Rad Results In Interface    Sent: 09/24/2014   2:11 PM      To: Drema DallasAdam R Jaffe, DO

## 2014-09-25 NOTE — Telephone Encounter (Signed)
Patient is aware MRI of the brain shows nothing alarming.

## 2014-09-28 ENCOUNTER — Other Ambulatory Visit: Payer: Self-pay | Admitting: Nurse Practitioner

## 2014-09-28 NOTE — Telephone Encounter (Signed)
Last visit 09/11/14, last refill 07/31/14. If approved route to Nurse pool [facility]

## 2014-10-01 ENCOUNTER — Other Ambulatory Visit: Payer: Self-pay | Admitting: Nurse Practitioner

## 2014-10-01 NOTE — Telephone Encounter (Signed)
rx called into pharmacy

## 2014-10-02 ENCOUNTER — Telehealth: Payer: Self-pay | Admitting: Nurse Practitioner

## 2014-10-02 MED ORDER — CYCLOBENZAPRINE HCL ER 15 MG PO CP24: ORAL_CAPSULE | ORAL | Status: DC

## 2014-10-02 NOTE — Telephone Encounter (Signed)
Patient aware.

## 2014-10-02 NOTE — Telephone Encounter (Signed)
[facility]

## 2014-10-02 NOTE — Telephone Encounter (Signed)
Patient aware Marie Carter was called in yesterday and also wants to switch [facility] to [facility]. [facility] can not get this medication at this time

## 2014-10-22 ENCOUNTER — Encounter: Payer: Self-pay | Admitting: Nurse Practitioner

## 2014-10-22 ENCOUNTER — Ambulatory Visit (INDEPENDENT_AMBULATORY_CARE_PROVIDER_SITE_OTHER): Admitting: Nurse Practitioner

## 2014-10-22 VITALS — BP 142/99 | HR 88 | Temp 98.7°F | Ht 61.0 in | Wt 255.0 lb

## 2014-10-22 DIAGNOSIS — I10 Essential (primary) hypertension: Secondary | ICD-10-CM | POA: Diagnosis not present

## 2014-10-22 DIAGNOSIS — K219 Gastro-esophageal reflux disease without esophagitis: Secondary | ICD-10-CM | POA: Diagnosis not present

## 2014-10-22 DIAGNOSIS — G2581 Restless legs syndrome: Secondary | ICD-10-CM

## 2014-10-22 DIAGNOSIS — F329 Major depressive disorder, single episode, unspecified: Secondary | ICD-10-CM

## 2014-10-22 DIAGNOSIS — F32A Depression, unspecified: Secondary | ICD-10-CM

## 2014-10-22 DIAGNOSIS — M797 Fibromyalgia: Secondary | ICD-10-CM | POA: Diagnosis not present

## 2014-10-22 DIAGNOSIS — E785 Hyperlipidemia, unspecified: Secondary | ICD-10-CM | POA: Diagnosis not present

## 2014-10-22 DIAGNOSIS — R3915 Urgency of urination: Secondary | ICD-10-CM | POA: Diagnosis not present

## 2014-10-22 DIAGNOSIS — G4700 Insomnia, unspecified: Secondary | ICD-10-CM

## 2014-10-22 MED ORDER — ZOLPIDEM TARTRATE 10 MG PO TABS: 10.0000 mg | ORAL_TABLET | Freq: Every day | ORAL | Status: DC

## 2014-10-22 MED ORDER — OMEPRAZOLE 40 MG PO CPDR: 40.0000 mg | DELAYED_RELEASE_CAPSULE | Freq: Every day | ORAL | Status: DC

## 2014-10-22 NOTE — Patient Instructions (Signed)
Exercise to Stay Healthy Exercise helps you become and stay healthy. EXERCISE IDEAS AND TIPS Choose exercises that:  You enjoy.  Fit into your day. You do not need to exercise really hard to be healthy. You can do exercises at a slow or medium level and stay healthy. You can:  Stretch before and after working out.  Try yoga, Pilates, or tai chi.  Lift weights.  Walk fast, swim, jog, run, climb stairs, bicycle, dance, or rollerskate.  Take aerobic classes. Exercises that burn about 150 calories:  Running 1  miles in 15 minutes.  Playing volleyball for 45 to 60 minutes.  Washing and waxing a car for 45 to 60 minutes.  Playing touch football for 45 minutes.  Walking 1  miles in 35 minutes.  Pushing a stroller 1  miles in 30 minutes.  Playing basketball for 30 minutes.  Raking leaves for 30 minutes.  Bicycling 5 miles in 30 minutes.  Walking 2 miles in 30 minutes.  Dancing for 30 minutes.  Shoveling snow for 15 minutes.  Swimming laps for 20 minutes.  Walking up stairs for 15 minutes.  Bicycling 4 miles in 15 minutes.  Gardening for 30 to 45 minutes.  Jumping rope for 15 minutes.  Washing windows or floors for 45 to 60 minutes. Document Released: 06/20/2010 Document Revised: 08/10/2011 Document Reviewed: 06/20/2010 Exit[employer] Patient Information 2015 [employer]. This information is not intended to replace advice given to you by your health care provider. Make sure you discuss any questions you have with your health care provider.  

## 2014-10-22 NOTE — Progress Notes (Signed)
Subjective:    Patient ID: Marie Carter, female    DOB: [birth year], 54 y.o.   MRN: [id number]   Patient here today for follow up of chronic medical problems.   Hypertension This is a chronic problem. The current episode started more than 1 year ago. The problem is unchanged. The problem is uncontrolled. Risk factors for coronary artery disease include dyslipidemia, obesity, post-menopausal state and sedentary lifestyle. Past treatments include ACE inhibitors and diuretics. The current treatment provides moderate improvement. Compliance problems include diet and exercise.  Hypertensive end-organ damage includes CVA. There is no history of CAD/MI.  Hyperlipidemia This is a chronic problem. The current episode started more than 1 year ago. Recent lipid tests were reviewed and are variable. Exacerbating diseases include obesity. She has no history of diabetes or hypothyroidism. Pertinent negatives include no myalgias. Current antihyperlipidemic treatment includes statins. The current treatment provides moderate improvement of lipids. Compliance problems include adherence to diet and adherence to exercise.  Risk factors for coronary artery disease include dyslipidemia, hypertension, obesity and post-menopausal.  migraine relpax works when she needs to take. Takes topamax which keeps her from having daily migraines. insomnia ambien nightly - awakens rested- no side effects fibromyalgia cymbalta and neurotin- makes pain tolearable- moving around helps alleviate pain. Takes ultram as needed which helps. amrix helps with the muscle cramps that she frequently has. RLS mirapex works well to keep legs from jerking while she is sleeping. Urinary urgency Takes [name] daily- works well. GERD Patient say sthat it is getting worse- eats rolaids all the time. Is not on any medication rx for this- has symptoms daily.   Review of Systems  Constitutional: Negative.   HENT: Negative.   Respiratory:  Negative.   Cardiovascular: Negative.   Gastrointestinal: Negative.   Genitourinary: Negative.   Musculoskeletal: Negative for myalgias.  Neurological: Negative.   Psychiatric/Behavioral: Negative.   All other systems reviewed and are negative.      Objective:   Physical Exam  Constitutional: She is oriented to person, place, and time. She appears well-developed and well-nourished.  HENT:  Nose: Nose normal.  Mouth/Throat: Oropharynx is clear and moist.  Eyes: EOM are normal.  Neck: Trachea normal, normal range of motion and full passive range of motion without pain. Neck supple. No JVD present. Carotid bruit is not present. No thyromegaly present.  Cardiovascular: Normal rate, regular rhythm, normal heart sounds and intact distal pulses.  Exam reveals no gallop and no friction rub.   No murmur heard. Pulmonary/Chest: Effort normal and breath sounds normal.  Abdominal: Soft. Bowel sounds are normal. She exhibits no distension and no mass. There is no tenderness.  Musculoskeletal: Normal range of motion.  Lymphadenopathy:    She has no cervical adenopathy.  Neurological: She is alert and oriented to person, place, and time. She has normal reflexes.  Skin: Skin is warm and dry.  Psychiatric: She has a normal mood and affect. Her behavior is normal. Judgment and thought content normal.    BP 142/99 mmHg  Pulse 88  Temp(Src) 98.7 F (37.1 C) (Oral)  Ht 5' 1"  (1.549 m)  Wt 255 lb (115.667 kg)  BMI 48.21 kg/m2  LMP 09/13/2012        Assessment & Plan:  1. Essential hypertension Do nt add slat to diet - CMP14+EGFR  2. Gastroesophageal reflux disease without esophagitis Avoid spicy foods Do not eat 2 hours prior to bedtime -omeprazole 40 mg 1 PO qd #90 1 refill  3. Fibromyalgia exercise encouraged  4. Urinary urgency   5. Severe obesity (BMI >= 40) Discussed diet and exercise for person with BMI >25 Will recheck weight in 3-6 months   6. RLS (restless legs  syndrome) Keep legs warm at night  7. Insomnia Bedtime ritual - zolpidem (AMBIEN) 10 MG tablet; Take 1 tablet (10 mg total) by mouth at bedtime.  Dispense: 30 tablet; Refill: 1  8. Hyperlipidemia Low fat diet - NMR, lipoprofile  9. Depression Stress management   Schedule mammogram  Labs pending Health maintenance reviewed Diet and exercise encouraged Continue all meds Follow up  In 3 month   [place], FNP

## 2014-10-23 LAB — CMP14+EGFR
A/G RATIO: 1.5 (ref 1.1–2.5)
ALT: 19 IU/L (ref 0–32)
AST: 13 IU/L (ref 0–40)
Albumin: 4.3 g/dL (ref 3.5–5.5)
Alkaline Phosphatase: 121 IU/L — ABNORMAL HIGH (ref 39–117)
BUN/Creatinine Ratio: 15 (ref 9–23)
BUN: 14 mg/dL (ref 6–24)
Bilirubin Total: 0.3 mg/dL (ref 0.0–1.2)
CALCIUM: 9.4 mg/dL (ref 8.7–10.2)
CO2: 24 mmol/L (ref 18–29)
Chloride: 101 mmol/L (ref 97–108)
Creatinine, Ser: 0.93 mg/dL (ref 0.57–1.00)
GFR calc non Af Amer: 69 mL/min/{1.73_m2} (ref >59)
GFR, EST AFRICAN AMERICAN: 80 mL/min/{1.73_m2} (ref >59)
GLOBULIN, TOTAL: 2.9 g/dL (ref 1.5–4.5)
Glucose: 94 mg/dL (ref 65–99)
Potassium: 4.8 mmol/L (ref 3.5–5.2)
SODIUM: 142 mmol/L (ref 134–144)
Total Protein: 7.2 g/dL (ref 6.0–8.5)

## 2014-10-23 LAB — NMR, LIPOPROFILE
CHOLESTEROL: 247 mg/dL — AB (ref 100–199)
HDL CHOLESTEROL BY NMR: 65 mg/dL (ref >39)
HDL PARTICLE NUMBER: 33.2 umol/L (ref >=30.5)
LDL PARTICLE NUMBER: 1725 nmol/L — AB (ref <1000)
LDL SIZE: 21.5 nm (ref >20.5)
LDL-C: 165 mg/dL — AB (ref 0–99)
SMALL LDL PARTICLE NUMBER: 466 nmol/L (ref <=527)
Triglycerides by NMR: 85 mg/dL (ref 0–149)

## 2014-11-05 ENCOUNTER — Encounter: Payer: Self-pay | Admitting: Neurology

## 2014-11-05 ENCOUNTER — Ambulatory Visit (INDEPENDENT_AMBULATORY_CARE_PROVIDER_SITE_OTHER): Admitting: Neurology

## 2014-11-05 VITALS — BP 155/105 | HR 101 | Ht 61.0 in | Wt 258.0 lb

## 2014-11-05 DIAGNOSIS — R404 Transient alteration of awareness: Secondary | ICD-10-CM

## 2014-11-05 DIAGNOSIS — G43009 Migraine without aura, not intractable, without status migrainosus: Secondary | ICD-10-CM | POA: Diagnosis not present
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
## 2014-11-05 NOTE — Patient Instructions (Addendum)
Continue topiramate 50mg  twice daily Take Relpax 40mg  when you get a migraine Follow up in 6 months.

## 2014-12-30 ENCOUNTER — Other Ambulatory Visit: Payer: Self-pay | Admitting: Nurse Practitioner

## 2015-01-01 ENCOUNTER — Other Ambulatory Visit: Payer: Self-pay | Admitting: Nurse Practitioner

## 2015-01-01 ENCOUNTER — Telehealth: Payer: Self-pay | Admitting: Nurse Practitioner
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
## 2015-01-02 ENCOUNTER — Other Ambulatory Visit: Payer: Self-pay | Admitting: Nurse Practitioner

## 2015-01-02 ENCOUNTER — Other Ambulatory Visit: Payer: Self-pay

## 2015-01-02 DIAGNOSIS — G4700 Insomnia, unspecified: Secondary | ICD-10-CM

## 2015-01-02 NOTE — Telephone Encounter (Signed)
Last seen 10/22/14  MMM If approved route to nurse to call into [facility] 

## 2015-01-02 NOTE — Telephone Encounter (Signed)
Last seen 10/22/14  MMM If approved route to nurse to call into [facility]

## 2015-01-03 ENCOUNTER — Telehealth: Payer: Self-pay | Admitting: Nurse Practitioner

## 2015-01-03 MED ORDER — ZOLPIDEM TARTRATE 10 MG PO TABS: 10.0000 mg | ORAL_TABLET | Freq: Every day | ORAL | Status: DC

## 2015-01-03 NOTE — Telephone Encounter (Signed)
Please call in ambien with 1 refills 

## 2015-01-03 NOTE — Telephone Encounter (Signed)
Patient informed that prescription sent to [facility]

## 2015-01-03 NOTE — Telephone Encounter (Signed)
Last seen 10/22/14  MMM If approved route to nurse to call into [facility] 

## 2015-01-03 NOTE — Telephone Encounter (Signed)
Already done

## 2015-01-08 ENCOUNTER — Ambulatory Visit (INDEPENDENT_AMBULATORY_CARE_PROVIDER_SITE_OTHER)

## 2015-01-08 ENCOUNTER — Encounter (INDEPENDENT_AMBULATORY_CARE_PROVIDER_SITE_OTHER): Payer: Self-pay

## 2015-01-08 ENCOUNTER — Ambulatory Visit (INDEPENDENT_AMBULATORY_CARE_PROVIDER_SITE_OTHER): Admitting: Physician Assistant

## 2015-01-08 ENCOUNTER — Encounter: Payer: Self-pay | Admitting: Physician Assistant

## 2015-01-08 DIAGNOSIS — M62838 Other muscle spasm: Secondary | ICD-10-CM

## 2015-01-08 DIAGNOSIS — M542 Cervicalgia: Secondary | ICD-10-CM

## 2015-01-08 DIAGNOSIS — M6248 Contracture of muscle, other site: Secondary | ICD-10-CM | POA: Diagnosis not present

## 2015-01-08 MED ORDER — BACLOFEN 10 MG PO TABS: 10.0000 mg | ORAL_TABLET | Freq: Three times a day (TID) | ORAL | Status: DC
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
## 2015-01-17 ENCOUNTER — Other Ambulatory Visit: Payer: Self-pay | Admitting: Nurse Practitioner

## 2015-01-24 ENCOUNTER — Ambulatory Visit (INDEPENDENT_AMBULATORY_CARE_PROVIDER_SITE_OTHER): Admitting: Nurse Practitioner

## 2015-01-24 ENCOUNTER — Encounter: Payer: Self-pay | Admitting: Nurse Practitioner

## 2015-01-24 VITALS — BP 153/106 | HR 87 | Temp 97.2°F | Ht 61.0 in | Wt 255.0 lb

## 2015-01-24 DIAGNOSIS — G4700 Insomnia, unspecified: Secondary | ICD-10-CM | POA: Diagnosis not present

## 2015-01-24 DIAGNOSIS — G43011 Migraine without aura, intractable, with status migrainosus: Secondary | ICD-10-CM | POA: Diagnosis not present

## 2015-01-24 DIAGNOSIS — K219 Gastro-esophageal reflux disease without esophagitis: Secondary | ICD-10-CM | POA: Diagnosis not present

## 2015-01-24 DIAGNOSIS — F329 Major depressive disorder, single episode, unspecified: Secondary | ICD-10-CM | POA: Diagnosis not present

## 2015-01-24 DIAGNOSIS — I10 Essential (primary) hypertension: Secondary | ICD-10-CM

## 2015-01-24 DIAGNOSIS — J4520 Mild intermittent asthma, uncomplicated: Secondary | ICD-10-CM | POA: Diagnosis not present

## 2015-01-24 DIAGNOSIS — E785 Hyperlipidemia, unspecified: Secondary | ICD-10-CM

## 2015-01-24 DIAGNOSIS — I6350 Cerebral infarction due to unspecified occlusion or stenosis of unspecified cerebral artery: Secondary | ICD-10-CM | POA: Diagnosis not present

## 2015-01-24 DIAGNOSIS — G2581 Restless legs syndrome: Secondary | ICD-10-CM

## 2015-01-24 DIAGNOSIS — F32A Depression, unspecified: Secondary | ICD-10-CM

## 2015-01-24 DIAGNOSIS — M797 Fibromyalgia: Secondary | ICD-10-CM

## 2015-01-24 DIAGNOSIS — R3915 Urgency of urination: Secondary | ICD-10-CM | POA: Diagnosis not present

## 2015-01-24 MED ORDER — FESOTERODINE FUMARATE ER 8 MG PO TB24: 8.0000 mg | ORAL_TABLET | Freq: Every day | ORAL | Status: DC

## 2015-01-24 MED ORDER — ATORVASTATIN CALCIUM 40 MG PO TABS: 40.0000 mg | ORAL_TABLET | Freq: Every day | ORAL | Status: DC

## 2015-01-24 MED ORDER — BACLOFEN 10 MG PO TABS: 10.0000 mg | ORAL_TABLET | Freq: Three times a day (TID) | ORAL | Status: DC

## 2015-01-24 MED ORDER — TOPIRAMATE 50 MG PO TABS: 50.0000 mg | ORAL_TABLET | Freq: Three times a day (TID) | ORAL | Status: DC

## 2015-01-24 MED ORDER — OMEPRAZOLE 40 MG PO CPDR: 40.0000 mg | DELAYED_RELEASE_CAPSULE | Freq: Every day | ORAL | Status: DC

## 2015-01-24 MED ORDER — GABAPENTIN 300 MG PO CAPS: ORAL_CAPSULE | ORAL | Status: DC

## 2015-01-24 MED ORDER — LISINOPRIL-HYDROCHLOROTHIAZIDE 20-12.5 MG PO TABS: 2.0000 | ORAL_TABLET | Freq: Every day | ORAL | Status: DC

## 2015-01-24 MED ORDER — PRAMIPEXOLE DIHYDROCHLORIDE 1 MG PO TABS: 1.0000 mg | ORAL_TABLET | Freq: Two times a day (BID) | ORAL | Status: DC

## 2015-01-24 MED ORDER — ZOLPIDEM TARTRATE 10 MG PO TABS: 10.0000 mg | ORAL_TABLET | Freq: Every day | ORAL | Status: DC

## 2015-01-24 MED ORDER — DULOXETINE HCL 60 MG PO CPEP: 60.0000 mg | ORAL_CAPSULE | Freq: Every day | ORAL | Status: DC

## 2015-01-24 NOTE — Progress Notes (Addendum)
Subjective:    Patient ID: Marie Carter, female    DOB: [birth year], 54 y.o.   MRN: [id number]   Patient here today for follow up of chronic medical problems.   Hypertension This is a chronic problem. The current episode started more than 1 year ago. The problem is unchanged. The problem is uncontrolled. Risk factors for coronary artery disease include dyslipidemia, obesity, post-menopausal state and sedentary lifestyle. Past treatments include ACE inhibitors and diuretics. The current treatment provides moderate improvement. Compliance problems include diet and exercise.  Hypertensive end-organ damage includes CVA. There is no history of CAD/MI.  Hyperlipidemia This is a chronic problem. The current episode started more than 1 year ago. Recent lipid tests were reviewed and are variable. Exacerbating diseases include obesity. She has no history of diabetes or hypothyroidism. Pertinent negatives include no myalgias. Current antihyperlipidemic treatment includes statins. The current treatment provides moderate improvement of lipids. Compliance problems include adherence to diet and adherence to exercise.  Risk factors for coronary artery disease include dyslipidemia, hypertension, obesity and post-menopausal.  fibromyalgia Currently on cymbalta and muscle relaxor- doing okay always has some pain but continues to work through it- uses and occasional ultram migraine Has cut down on caffeine and that has really helped  Her frequency of migraines- still taking topamax and occasional relpax. CVA history/left sided weakness Doing well still walks with a cane RLS mirapex working well GERD Currently on omeprazole daily which works well to keep symptoms under control Insomnia ambien nightly- cannot sleep without taking depression cymbalta helping- no crying daily like she use to urinary urgency [name] helps urgency- no incontinence     Review of Systems  Constitutional: Negative.   HENT:  Negative.   Respiratory: Negative.   Cardiovascular: Negative.   Gastrointestinal: Negative.   Genitourinary: Negative.   Musculoskeletal: Negative for myalgias.  Neurological: Negative.   Psychiatric/Behavioral: Negative.   All other systems reviewed and are negative.      Objective:   Physical Exam  Constitutional: She is oriented to person, place, and time. She appears well-developed and well-nourished.  HENT:  Nose: Nose normal.  Mouth/Throat: Oropharynx is clear and moist.  Eyes: EOM are normal.  Neck: Trachea normal, normal range of motion and full passive range of motion without pain. Neck supple. No JVD present. Carotid bruit is not present. No thyromegaly present.  Cardiovascular: Normal rate, regular rhythm, normal heart sounds and intact distal pulses.  Exam reveals no gallop and no friction rub.   No murmur heard. Pulmonary/Chest: Effort normal and breath sounds normal.  Abdominal: Soft. Bowel sounds are normal. She exhibits no distension and no mass. There is no tenderness.  Musculoskeletal: Normal range of motion.  Lymphadenopathy:    She has no cervical adenopathy.  Neurological: She is alert and oriented to person, place, and time. She has normal reflexes.  Skin: Skin is warm and dry.  Psychiatric: She has a normal mood and affect. Her behavior is normal. Judgment and thought content normal.   BP 153/106 mmHg  Pulse 87  Temp(Src) 97.2 F (36.2 C) (Oral)  Ht 5' 1"  (1.549 m)  Wt 255 lb (115.667 kg)  BMI 48.21 kg/m2  LMP 09/13/2012         Assessment & Plan:  1. Essential hypertension Do not add salt to diet Changed lisinopril from 20/25 daily to 20/12.5 2 daily - CMP14+EGFR - lisinopril-hydrochlorothiazide (ZESTORETIC) 20-12.5 MG per tablet; Take 2 tablets by mouth daily.  Dispense: 180 tablet; Refill: 1  2. Asthma,  chronic, mild intermittent, uncomplicated 3. Gastroesophageal reflux disease without esophagitis Avoid spicy foods Do not eat 2 hours  prior to bedtime - omeprazole (PRILOSEC) 40 MG capsule; Take 1 capsule (40 mg total) by mouth daily.  Dispense: 90 capsule; Refill: 1  4. Fibromyalgia Increase exercise - gabapentin (NEURONTIN) 300 MG capsule; TAKE 2 CAPSULES THREE TIMES A DAY  Dispense: 540 capsule; Refill: 1 - baclofen (LIORESAL) 10 MG tablet; Take 1 tablet (10 mg total) by mouth 3 (three) times daily.  Dispense: 60 each; Refill: 1 - DULoxetine (CYMBALTA) 60 MG capsule; Take 1 capsule (60 mg total) by mouth daily.  Dispense: 90 capsule; Refill: 1  5. 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 Hyperlipidemia Low fat diet - atorvastatin (LIPITOR) 40 MG tablet; Take 1 tablet (40 mg total) by mouth daily.  Dispense: 90 tablet; Refill: 1 - Lipid panel  7. Depression Stress management  8. Insomnia Bedtime ritual - zolpidem (AMBIEN) 10 MG tablet; Take 1 tablet (10 mg total) by mouth at bedtime.  Dispense: 30 tablet; Refill: 1  9. RLS (restless legs syndrome) Keep legs warm at noght - pramipexole (MIRAPEX) 1 MG tablet; Take 1 tablet (1 mg total) by mouth 2 (two) times daily.  Dispense: 180 tablet; Refill: 1  10. Severe obesity (BMI >= 40) Discussed diet and exercise for person with BMI >25 Will recheck weight in 3-6 months  11. Intractable migraine without aura and with status migrainosus Avoid caffeine - topiramate (TOPAMAX) 50 MG tablet; Take 1 tablet (50 mg total) by mouth 3 (three) times daily.  Dispense: 270 tablet; Refill: 1  12. Urinary urgency - fesoterodine (TOVIAZ) 8 MG TB24 tablet; Take 1 tablet (8 mg total) by mouth daily.  Dispense: 90 tablet; Refill: 1   Patient to make appointment for mammogram Labs pending Health maintenance reviewed Diet and exercise encouraged Continue all meds Follow up  In 3 month   [place], FNP

## 2015-01-24 NOTE — Patient Instructions (Signed)

## 2015-01-25 ENCOUNTER — Other Ambulatory Visit: Payer: Self-pay | Admitting: Nurse Practitioner

## 2015-01-25 ENCOUNTER — Telehealth: Payer: Self-pay

## 2015-01-25 DIAGNOSIS — M797 Fibromyalgia: Secondary | ICD-10-CM

## 2015-01-25 DIAGNOSIS — G2581 Restless legs syndrome: Secondary | ICD-10-CM

## 2015-01-25 DIAGNOSIS — G43011 Migraine without aura, intractable, with status migrainosus: Secondary | ICD-10-CM

## 2015-01-25 DIAGNOSIS — K219 Gastro-esophageal reflux disease without esophagitis: Secondary | ICD-10-CM

## 2015-01-25 DIAGNOSIS — E785 Hyperlipidemia, unspecified: Secondary | ICD-10-CM

## 2015-01-25 DIAGNOSIS — R3915 Urgency of urination: Secondary | ICD-10-CM

## 2015-01-25 LAB — LIPID PANEL
CHOLESTEROL TOTAL: 205 mg/dL — AB (ref 100–199)
Chol/HDL Ratio: 3.5 ratio units (ref 0.0–4.4)
HDL: 58 mg/dL (ref >39)
LDL Calculated: 127 mg/dL — ABNORMAL HIGH (ref 0–99)
Triglycerides: 98 mg/dL (ref 0–149)
VLDL Cholesterol Cal: 20 mg/dL (ref 5–40)

## 2015-01-25 LAB — CMP14+EGFR
A/G RATIO: 1.5 (ref 1.1–2.5)
ALBUMIN: 4.3 g/dL (ref 3.5–5.5)
ALK PHOS: 117 IU/L (ref 39–117)
ALT: 27 IU/L (ref 0–32)
AST: 16 IU/L (ref 0–40)
BILIRUBIN TOTAL: 0.3 mg/dL (ref 0.0–1.2)
BUN / CREAT RATIO: 15 (ref 9–23)
BUN: 14 mg/dL (ref 6–24)
CHLORIDE: 100 mmol/L (ref 97–108)
CO2: 24 mmol/L (ref 18–29)
Calcium: 9.4 mg/dL (ref 8.7–10.2)
Creatinine, Ser: 0.94 mg/dL (ref 0.57–1.00)
GFR calc non Af Amer: 69 mL/min/{1.73_m2} (ref >59)
GFR, EST AFRICAN AMERICAN: 79 mL/min/{1.73_m2} (ref >59)
Globulin, Total: 2.9 g/dL (ref 1.5–4.5)
Glucose: 92 mg/dL (ref 65–99)
POTASSIUM: 4.1 mmol/L (ref 3.5–5.2)
Sodium: 140 mmol/L (ref 134–144)
TOTAL PROTEIN: 7.2 g/dL (ref 6.0–8.5)

## 2015-01-25 MED ORDER — PRAMIPEXOLE DIHYDROCHLORIDE 1 MG PO TABS: 1.0000 mg | ORAL_TABLET | Freq: Two times a day (BID) | ORAL | Status: DC

## 2015-01-25 MED ORDER — TOPIRAMATE 50 MG PO TABS: 50.0000 mg | ORAL_TABLET | Freq: Three times a day (TID) | ORAL | Status: DC

## 2015-01-25 MED ORDER — OMEPRAZOLE 40 MG PO CPDR: 40.0000 mg | DELAYED_RELEASE_CAPSULE | Freq: Every day | ORAL | Status: DC

## 2015-01-25 MED ORDER — DULOXETINE HCL 60 MG PO CPEP: 60.0000 mg | ORAL_CAPSULE | Freq: Every day | ORAL | Status: DC

## 2015-01-25 MED ORDER — FESOTERODINE FUMARATE ER 8 MG PO TB24: 8.0000 mg | ORAL_TABLET | Freq: Every day | ORAL | Status: DC

## 2015-01-25 MED ORDER — BACLOFEN 10 MG PO TABS: 10.0000 mg | ORAL_TABLET | Freq: Three times a day (TID) | ORAL | Status: DC

## 2015-01-25 MED ORDER — ATORVASTATIN CALCIUM 40 MG PO TABS: 40.0000 mg | ORAL_TABLET | Freq: Every day | ORAL | Status: DC

## 2015-01-25 MED ORDER — GABAPENTIN 300 MG PO CAPS: ORAL_CAPSULE | ORAL | Status: DC

## 2015-01-25 NOTE — Telephone Encounter (Signed)
Error

## 2015-02-28 ENCOUNTER — Encounter: Payer: Self-pay | Admitting: Pediatrics

## 2015-02-28 ENCOUNTER — Ambulatory Visit (INDEPENDENT_AMBULATORY_CARE_PROVIDER_SITE_OTHER): Admitting: Pediatrics

## 2015-02-28 ENCOUNTER — Other Ambulatory Visit: Payer: Self-pay | Admitting: Nurse Practitioner

## 2015-02-28 VITALS — BP 153/105 | HR 89 | Temp 98.3°F | Ht 61.0 in | Wt 254.6 lb

## 2015-02-28 DIAGNOSIS — IMO0001 Reserved for inherently not codable concepts without codable children: Secondary | ICD-10-CM

## 2015-02-28 DIAGNOSIS — R030 Elevated blood-pressure reading, without diagnosis of hypertension: Secondary | ICD-10-CM

## 2015-02-28 DIAGNOSIS — R6889 Other general symptoms and signs: Secondary | ICD-10-CM

## 2015-02-28 DIAGNOSIS — J4520 Mild intermittent asthma, uncomplicated: Secondary | ICD-10-CM | POA: Diagnosis not present

## 2015-02-28 DIAGNOSIS — R062 Wheezing: Secondary | ICD-10-CM

## 2015-02-28 DIAGNOSIS — Z780 Asymptomatic menopausal state: Secondary | ICD-10-CM

## 2015-02-28 MED ORDER — ALBUTEROL SULFATE HFA 108 (90 BASE) MCG/ACT IN AERS: 2.0000 | INHALATION_SPRAY | Freq: Four times a day (QID) | RESPIRATORY_TRACT | Status: DC | PRN

## 2015-02-28 MED ORDER — AZITHROMYCIN 250 MG PO TABS: ORAL_TABLET | ORAL | Status: DC

## 2015-02-28 MED ORDER — GUAIFENESIN-CODEINE 100-10 MG/5ML PO SOLN: 5.0000 mL | Freq: Three times a day (TID) | ORAL | Status: DC | PRN

## 2015-02-28 NOTE — Addendum Note (Signed)
Addended by: Johna Sheriff on: 02/28/2015 11:08 PM   Modules accepted: Level of Service

## 2015-02-28 NOTE — Progress Notes (Addendum)
Subjective:    Patient ID: Marie Carter, female    DOB: 11/13/1960, 54 y.o.   MRN: [id number]  HPI: Debbe Crumble is a 54 y.o. female presenting on 02/28/2015 for Cough; Fever; Chills; and sneezing  Started about a week ago, had a fever, runny nose, coughing, sneezing, coughing things up. Appetite up and down. Chest hurting with breathing from the coughing. Taking albuterol here and there, helps some. Symptoms have been getting worse, not better. Continues to have subjective fevers.  Daughter has been sick. Someone in her class tested positive for flu last week, she developed similar symptoms soon afterwards.  Relevant past medical, surgical, family and social history reviewed and updated as indicated. Interim medical history since our last visit reviewed. Allergies and medications reviewed and updated.  ROS: Per HPI unless specifically indicated above  Past Medical History Patient Active Problem List   Diagnosis Date Noted  . Migraine without aura and without status migrainosus, not intractable 11/05/2014  . Severe obesity (BMI >= 40) 07/23/2014  . RLS (restless legs syndrome) 01/15/2014  . GERD (gastroesophageal reflux disease) 10/05/2012  . Hyperlipidemia 10/05/2012  . Fibromyalgia 10/05/2012  . CVA (cerebral infarction) 10/05/2012  . Depression 10/05/2012  . Asthma, chronic 10/05/2012  . Hypertension 10/05/2012  . Urinary urgency 10/05/2012  . Insomnia 10/05/2012    Current Outpatient Prescriptions  Medication Sig Dispense Refill  . albuterol (PROVENTIL HFA;VENTOLIN HFA) 108 (90 BASE) MCG/ACT inhaler Inhale 2 puffs into the lungs every 6 (six) hours as needed for wheezing. 3 Inhaler 3  . atorvastatin (LIPITOR) 40 MG tablet Take 1 tablet (40 mg total) by mouth daily. 90 tablet 1  . baclofen (LIORESAL) 10 MG tablet Take 1 tablet (10 mg total) by mouth 3 (three) times daily. 60 each 1  . DULoxetine (CYMBALTA) 60 MG capsule Take 1 capsule (60 mg total) by mouth daily. 90  capsule 1  . eletriptan (RELPAX) 20 MG tablet Take 2 tablets (40 mg total) by mouth as needed. may repeat in 2 hours if necessary 10 tablet 5  . EPINEPHrine 0.3 mg/0.3 mL IJ SOAJ injection Inject 0.3 mLs (0.3 mg total) into the muscle once. 2 Device 2  . fesoterodine (TOVIAZ) 8 MG TB24 tablet Take 1 tablet (8 mg total) by mouth daily. 90 tablet 1  . gabapentin (NEURONTIN) 300 MG capsule TAKE 2 CAPSULES THREE TIMES A DAY 540 capsule 1  . lisinopril-hydrochlorothiazide (ZESTORETIC) 20-12.5 MG per tablet Take 2 tablets by mouth daily. 180 tablet 1  . omeprazole (PRILOSEC) 40 MG capsule Take 1 capsule (40 mg total) by mouth daily. 90 capsule 1  . pramipexole (MIRAPEX) 1 MG tablet Take 1 tablet (1 mg total) by mouth 2 (two) times daily. 180 tablet 1  . topiramate (TOPAMAX) 50 MG tablet Take 1 tablet (50 mg total) by mouth 3 (three) times daily. 270 tablet 1  . traMADol (ULTRAM) 50 MG tablet Take 1 tablet (50 mg total) by mouth 3 (three) times daily as needed. 90 tablet 1  . zolpidem (AMBIEN) 10 MG tablet Take 1 tablet (10 mg total) by mouth at bedtime. 30 tablet 1  . azithromycin (ZITHROMAX) 250 MG tablet Take 2 the first day and then one each day after. 6 tablet 0  . guaiFENesin-codeine 100-10 MG/5ML syrup Take 5-10 mLs by mouth 3 (three) times daily as needed for cough. 180 mL 0   No current facility-administered medications for this visit.       Objective:    BP 153/105  mmHg  Pulse 89  Temp(Src) 98.3 F (36.8 C) (Oral)  Ht  (1.549 m)  Wt 254 lb 9.6 oz (115.486 kg)  BMI 48.13 kg/m2  LMP 09/13/2012  Wt Readings from Last 3 Encounters:  02/28/15 254 lb 9.6 oz (115.486 kg)  01/24/15 255 lb (115.667 kg)  01/08/15 255 lb (115.667 kg)    Gen:  Alert, looks like she feels unwell, coughing, congested EYES: EOMI, no scleral injection or icterus ENT:  TMs red with effusion b/l, OP erythematous, tender over maxillary/frontal sinuses LYMPH: + small <1cm cervical LAD CV: NRRR, normal S1/S2,  no murmur, DP pulses 2+ b/l Resp: moving air well, prolonged expiratory phase, no rales, regularly coughing throughout interview/exam Abd: +BS, soft, NTND. no guarding or organomegaly Neuro: Alert and oriented    Assessment & Plan:   Darnelle was seen today for flu-like symptoms, ongoing for a week, will not test for flu. Worsening congestion, sinus pressure. Prolonged expiratory phase. Will treat with abx, albuterol regularly, cough syrup. Return precautions given. Will need f/u for BP.  Diagnoses and all orders for this visit:  Flu-like symptoms -     guaiFENesin-codeine 100-10 MG/5ML syrup; Take 5-10 mLs by mouth 3 (three) times daily as needed for cough.  Asthma, chronic, mild intermittent, uncomplicated now wheezing. -     albuterol (PROVENTIL HFA;VENTOLIN HFA) 108 (90 BASE) MCG/ACT inhaler; Inhale 2 puffs into the lungs every 6 (six) hours as needed for wheezing.  Acute bacterial sinusitis -     azithromycin (ZITHROMAX) 250 MG tablet; Take 2 the first day and then one each day after.  Elevated blood pressure Acute illness. Recheck when symptoms improved.  Follow up plan: Return if symptoms worsen or fail to improve.  Rex Kras, MD [facility] 02/28/2015, 12:24 PM

## 2015-03-07 ENCOUNTER — Encounter: Payer: Self-pay | Admitting: Nurse Practitioner

## 2015-03-12 ENCOUNTER — Encounter: Payer: Self-pay | Admitting: Nurse Practitioner

## 2015-03-13 ENCOUNTER — Other Ambulatory Visit: Payer: Self-pay | Admitting: Nurse Practitioner

## 2015-03-14 NOTE — Telephone Encounter (Signed)
Last seen 02/28/15 Dr Oswaldo DoneVincent  This was filled on 02/28/15

## 2015-03-19 ENCOUNTER — Encounter: Payer: Self-pay | Admitting: Nurse Practitioner

## 2015-04-16 ENCOUNTER — Other Ambulatory Visit: Payer: Self-pay | Admitting: Nurse Practitioner

## 2015-04-29 ENCOUNTER — Ambulatory Visit (INDEPENDENT_AMBULATORY_CARE_PROVIDER_SITE_OTHER): Admitting: Nurse Practitioner

## 2015-04-29 ENCOUNTER — Encounter: Payer: Self-pay | Admitting: Nurse Practitioner

## 2015-04-29 VITALS — BP 165/90 | HR 82 | Temp 97.3°F | Ht 61.0 in | Wt 253.0 lb

## 2015-04-29 DIAGNOSIS — G2581 Restless legs syndrome: Secondary | ICD-10-CM | POA: Diagnosis not present

## 2015-04-29 DIAGNOSIS — F329 Major depressive disorder, single episode, unspecified: Secondary | ICD-10-CM

## 2015-04-29 DIAGNOSIS — E785 Hyperlipidemia, unspecified: Secondary | ICD-10-CM | POA: Diagnosis not present

## 2015-04-29 DIAGNOSIS — G4700 Insomnia, unspecified: Secondary | ICD-10-CM

## 2015-04-29 DIAGNOSIS — I10 Essential (primary) hypertension: Secondary | ICD-10-CM | POA: Diagnosis not present

## 2015-04-29 DIAGNOSIS — Z1159 Encounter for screening for other viral diseases: Secondary | ICD-10-CM | POA: Diagnosis not present

## 2015-04-29 DIAGNOSIS — M797 Fibromyalgia: Secondary | ICD-10-CM | POA: Diagnosis not present

## 2015-04-29 DIAGNOSIS — I6350 Cerebral infarction due to unspecified occlusion or stenosis of unspecified cerebral artery: Secondary | ICD-10-CM | POA: Diagnosis not present

## 2015-04-29 DIAGNOSIS — F32A Depression, unspecified: Secondary | ICD-10-CM

## 2015-04-29 DIAGNOSIS — G43009 Migraine without aura, not intractable, without status migrainosus: Secondary | ICD-10-CM | POA: Diagnosis not present

## 2015-04-29 DIAGNOSIS — K219 Gastro-esophageal reflux disease without esophagitis: Secondary | ICD-10-CM

## 2015-04-29 MED ORDER — BACLOFEN 10 MG PO TABS: 10.0000 mg | ORAL_TABLET | Freq: Three times a day (TID) | ORAL | Status: DC

## 2015-04-29 MED ORDER — LISINOPRIL-HYDROCHLOROTHIAZIDE 20-25 MG PO TABS: 1.0000 | ORAL_TABLET | Freq: Every day | ORAL | Status: DC

## 2015-04-29 MED ORDER — LISINOPRIL-HYDROCHLOROTHIAZIDE 20-12.5 MG PO TABS: 2.0000 | ORAL_TABLET | Freq: Every day | ORAL | Status: DC

## 2015-04-29 MED ORDER — ZOLPIDEM TARTRATE 10 MG PO TABS: 10.0000 mg | ORAL_TABLET | Freq: Every day | ORAL | Status: DC

## 2015-04-29 NOTE — Progress Notes (Signed)
Subjective:    Patient ID: Marie Carter, female    DOB: Jun 13, 1960, 54 y.o.   MRN: [id number]   Patient here today for follow up of chronic medical problems. No changes since last visit.   Hypertension This is a chronic problem. The current episode started more than 1 year ago. The problem is unchanged. The problem is uncontrolled. Risk factors for coronary artery disease include dyslipidemia, obesity, post-menopausal state and sedentary lifestyle. Past treatments include ACE inhibitors and diuretics (changed meds at last viist but [facility] still has not sent new med dose- did not take meds today.). The current treatment provides moderate improvement. Compliance problems include diet and exercise.  Hypertensive end-organ damage includes CVA. There is no history of CAD/MI.  Hyperlipidemia This is a chronic problem. The current episode started more than 1 year ago. Recent lipid tests were reviewed and are variable. Exacerbating diseases include obesity. She has no history of diabetes or hypothyroidism. Pertinent negatives include no myalgias. Current antihyperlipidemic treatment includes statins. The current treatment provides moderate improvement of lipids. Compliance problems include adherence to diet and adherence to exercise.  Risk factors for coronary artery disease include dyslipidemia, hypertension, obesity and post-menopausal.  fibromyalgia Currently on cymbalta and muscle relaxor- doing okay always has some pain but continues to work through it- uses and occasional ultram migraine Has cut down on caffeine and that has really helped  Her frequency of migraines- still taking topamax and occasional relpax. CVA history/left sided weakness Doing well still walks with a cane RLS mirapex working well GERD Currently on omeprazole daily which works well to keep symptoms under control Insomnia ambien nightly- cannot sleep without taking depression cymbalta helping- no crying daily like  she use to urinary urgency [name] helps urgency- no incontinence     Review of Systems  Constitutional: Negative.   HENT: Negative.   Respiratory: Negative.   Cardiovascular: Negative.   Gastrointestinal: Negative.   Genitourinary: Negative.   Musculoskeletal: Negative for myalgias.  Neurological: Negative.   Psychiatric/Behavioral: Negative.   All other systems reviewed and are negative.      Objective:   Physical Exam  Constitutional: She is oriented to person, place, and time. She appears well-developed and well-nourished.  HENT:  Nose: Nose normal.  Mouth/Throat: Oropharynx is clear and moist.  Eyes: EOM are normal.  Neck: Trachea normal, normal range of motion and full passive range of motion without pain. Neck supple. No JVD present. Carotid bruit is not present. No thyromegaly present.  Cardiovascular: Normal rate, regular rhythm, normal heart sounds and intact distal pulses.  Exam reveals no gallop and no friction rub.   No murmur heard. Pulmonary/Chest: Effort normal and breath sounds normal.  Abdominal: Soft. Bowel sounds are normal. She exhibits no distension and no mass. There is no tenderness.  Musculoskeletal: Normal range of motion.  Lymphadenopathy:    She has no cervical adenopathy.  Neurological: She is alert and oriented to person, place, and time. She has normal reflexes.  Skin: Skin is warm and dry.  Psychiatric: She has a normal mood and affect. Her behavior is normal. Judgment and thought content normal.    BP 165/90 mmHg  Pulse 82  Temp(Src) 97.3 F (36.3 C) (Oral)  Ht 5' 1"  (1.549 m)  Wt 253 lb (114.76 kg)  BMI 47.83 kg/m2  LMP 09/13/2012     Assessment & Plan:  1. Essential hypertension Do not add salt to diet Changed meds - CMP14+EGFR - lisinopril-hydrochlorothiazide (ZESTORETIC) 20-12.5 MG tablet; Take  2 tablets by mouth daily.  Dispense: 180 tablet; Refill: 1  2. Migraine without aura and without status migrainosus, not  intractable Avoid caffeine  3. Gastroesophageal reflux disease without esophagitis Avoid spicy foods Do not eat 2 hours prior to bedtime  4. 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 Fibromyalgia  6. Hyperlipidemia Low fta diet - Lipid panel  7. Depression Stress management  8. Insomnia Bedtime ritual - zolpidem (AMBIEN) 10 MG tablet; Take 1 tablet (10 mg total) by mouth at bedtime.  Dispense: 30 tablet; Refill: 1  9. RLS (restless legs syndrome) Keep legs warm  10. Morbid obesity, unspecified obesity type ([place])  11. Need for hepatitis C screening test - Hepatitis C antibody    Labs pending Health maintenance reviewed Diet and exercise encouraged Continue all meds Follow up  In 3 month   [place], FNP

## 2015-04-29 NOTE — Patient Instructions (Signed)
Fall Prevention in the Home  Falls can cause injuries and can affect people from all age groups. There are many simple things that you can do to make your home safe and to help prevent falls. WHAT CAN I DO ON THE OUTSIDE OF MY HOME?  Regularly repair the edges of walkways and driveways and fix any cracks.  Remove high doorway thresholds.  Trim any shrubbery on the main path into your home.  Use bright outdoor lighting.  Clear walkways of debris and clutter, including tools and rocks.  Regularly check that handrails are securely fastened and in good repair. Both sides of any steps should have handrails.  Install guardrails along the edges of any raised decks or porches.  Have leaves, snow, and ice cleared regularly.  Use sand or salt on walkways during winter months.  In the garage, clean up any spills right away, including grease or oil spills. WHAT CAN I DO IN THE BATHROOM?  Use night lights.  Install grab bars by the toilet and in the tub and shower. Do not use towel bars as grab bars.  Use non-skid mats or decals on the floor of the tub or shower.  If you need to sit down while you are in the shower, use a plastic, non-slip stool..  Keep the floor dry. Immediately clean up any water that spills on the floor.  Remove soap buildup in the tub or shower on a regular basis.  Attach bath mats securely with double-sided non-slip rug tape.  Remove throw rugs and other tripping hazards from the floor. WHAT CAN I DO IN THE BEDROOM?  Use night lights.  Make sure that a bedside light is easy to reach.  Do not use oversized bedding that drapes onto the floor.  Have a firm chair that has side arms to use for getting dressed.  Remove throw rugs and other tripping hazards from the floor. WHAT CAN I DO IN THE KITCHEN?   Clean up any spills right away.  Avoid walking on wet floors.  Place frequently used items in easy-to-reach places.  If you need to reach for something  above you, use a sturdy step stool that has a grab bar.  Keep electrical cables out of the way.  Do not use floor polish or wax that makes floors slippery. If you have to use wax, make sure that it is non-skid floor wax.  Remove throw rugs and other tripping hazards from the floor. WHAT CAN I DO IN THE STAIRWAYS?  Do not leave any items on the stairs.  Make sure that there are handrails on both sides of the stairs. Fix handrails that are broken or loose. Make sure that handrails are as long as the stairways.  Check any carpeting to make sure that it is firmly attached to the stairs. Fix any carpet that is loose or worn.  Avoid having throw rugs at the top or bottom of stairways, or secure the rugs with carpet tape to prevent them from moving.  Make sure that you have a light switch at the top of the stairs and the bottom of the stairs. If you do not have them, have them installed. WHAT ARE SOME OTHER FALL PREVENTION TIPS?  Wear closed-toe shoes that fit well and support your feet. Wear shoes that have rubber soles or low heels.  When you use a stepladder, make sure that it is completely opened and that the sides are firmly locked. Have someone hold the ladder while you   are using it. Do not climb a closed stepladder.  Add color or contrast paint or tape to grab bars and handrails in your home. Place contrasting color strips on the first and last steps.  Use mobility aids as needed, such as canes, walkers, scooters, and crutches.  Turn on lights if it is dark. Replace any light bulbs that burn out.  Set up furniture so that there are clear paths. Keep the furniture in the same spot.  Fix any uneven floor surfaces.  Choose a carpet design that does not hide the edge of steps of a stairway.  Be aware of any and all pets.  Review your medicines with your healthcare provider. Some medicines can cause dizziness or changes in blood pressure, which increase your risk of falling. Talk  with your health care provider about other ways that you can decrease your risk of falls. This may include working with a physical therapist or trainer to improve your strength, balance, and endurance.   This information is not intended to replace advice given to you by your health care provider. Make sure you discuss any questions you have with your health care provider.   Document Released: 05/08/2002 Document Revised: 10/02/2014 Document Reviewed: 06/22/2014 Elsevier Interactive Patient Education 2016 [employer]  

## 2015-04-30 LAB — CMP14+EGFR
ALT: 17 IU/L (ref 0–32)
AST: 16 IU/L (ref 0–40)
Albumin/Globulin Ratio: 1.6 (ref 1.1–2.5)
Albumin: 4.3 g/dL (ref 3.5–5.5)
Alkaline Phosphatase: 106 IU/L (ref 39–117)
BUN/Creatinine Ratio: 13 (ref 9–23)
BUN: 16 mg/dL (ref 6–24)
CALCIUM: 9 mg/dL (ref 8.7–10.2)
CHLORIDE: 101 mmol/L (ref 97–106)
CO2: 26 mmol/L (ref 18–29)
Creatinine, Ser: 1.25 mg/dL — ABNORMAL HIGH (ref 0.57–1.00)
GFR, EST AFRICAN AMERICAN: 56 mL/min/{1.73_m2} — AB (ref >59)
GFR, EST NON AFRICAN AMERICAN: 49 mL/min/{1.73_m2} — AB (ref >59)
GLOBULIN, TOTAL: 2.7 g/dL (ref 1.5–4.5)
Glucose: 92 mg/dL (ref 65–99)
POTASSIUM: 4.3 mmol/L (ref 3.5–5.2)
SODIUM: 143 mmol/L (ref 136–144)
TOTAL PROTEIN: 7 g/dL (ref 6.0–8.5)

## 2015-04-30 LAB — LIPID PANEL
CHOL/HDL RATIO: 4.1 ratio (ref 0.0–4.4)
Cholesterol, Total: 227 mg/dL — ABNORMAL HIGH (ref 100–199)
HDL: 56 mg/dL (ref >39)
LDL Calculated: 150 mg/dL — ABNORMAL HIGH (ref 0–99)
Triglycerides: 107 mg/dL (ref 0–149)
VLDL Cholesterol Cal: 21 mg/dL (ref 5–40)

## 2015-04-30 LAB — HEPATITIS C ANTIBODY

## 2015-07-04 ENCOUNTER — Other Ambulatory Visit: Payer: Self-pay | Admitting: Nurse Practitioner

## 2015-07-04 DIAGNOSIS — G4700 Insomnia, unspecified: Secondary | ICD-10-CM

## 2015-07-04 NOTE — Telephone Encounter (Signed)
Please call in ambien with 1 refills 

## 2015-07-04 NOTE — Telephone Encounter (Signed)
Last filled 05/29/15, last seen 04/29/15. Call in at [facility]

## 2015-07-05 ENCOUNTER — Other Ambulatory Visit: Payer: Self-pay | Admitting: Nurse Practitioner

## 2015-07-05 DIAGNOSIS — G4700 Insomnia, unspecified: Secondary | ICD-10-CM

## 2015-07-05 MED ORDER — ZOLPIDEM TARTRATE 10 MG PO TABS
10.0000 mg | ORAL_TABLET | Freq: Every day | ORAL | Status: DC
Start: 2015-07-05 — End: 2016-02-04

## 2015-07-05 MED ORDER — ZOLPIDEM TARTRATE 10 MG PO TABS: 10.0000 mg | ORAL_TABLET | Freq: Every day | ORAL | Status: DC

## 2015-07-05 NOTE — Progress Notes (Signed)
Pt is aware rx is ready for pick up. 

## 2015-07-05 NOTE — Telephone Encounter (Signed)
rx called into pharmacy

## 2015-07-22 ENCOUNTER — Telehealth: Payer: Self-pay | Admitting: Nurse Practitioner

## 2015-07-22 NOTE — Telephone Encounter (Signed)
Stp and gave her an appt in the morning with MMM at 10:45 and advised pt to use ice and take motrin or tylenol. Pt states her pain level is a 10 so pt was advised to go to the ER but pt states they don't have the money as she was crying on the phone. Strongly advised pt to go to the ER, and pt still declined.

## 2015-07-23 ENCOUNTER — Other Ambulatory Visit: Payer: Self-pay | Admitting: Nurse Practitioner

## 2015-07-23 ENCOUNTER — Ambulatory Visit: Admitting: Nurse Practitioner

## 2015-08-01 ENCOUNTER — Encounter: Payer: Self-pay | Admitting: Nurse Practitioner

## 2015-08-01 ENCOUNTER — Ambulatory Visit (INDEPENDENT_AMBULATORY_CARE_PROVIDER_SITE_OTHER): Admitting: Nurse Practitioner

## 2015-08-01 VITALS — BP 146/102 | HR 87 | Temp 96.7°F | Ht 61.0 in | Wt 242.0 lb

## 2015-08-01 DIAGNOSIS — I6350 Cerebral infarction due to unspecified occlusion or stenosis of unspecified cerebral artery: Secondary | ICD-10-CM

## 2015-08-01 DIAGNOSIS — R3915 Urgency of urination: Secondary | ICD-10-CM | POA: Diagnosis not present

## 2015-08-01 DIAGNOSIS — F329 Major depressive disorder, single episode, unspecified: Secondary | ICD-10-CM | POA: Diagnosis not present

## 2015-08-01 DIAGNOSIS — M797 Fibromyalgia: Secondary | ICD-10-CM

## 2015-08-01 DIAGNOSIS — M25561 Pain in right knee: Secondary | ICD-10-CM

## 2015-08-01 DIAGNOSIS — G43009 Migraine without aura, not intractable, without status migrainosus: Secondary | ICD-10-CM | POA: Diagnosis not present

## 2015-08-01 DIAGNOSIS — K219 Gastro-esophageal reflux disease without esophagitis: Secondary | ICD-10-CM

## 2015-08-01 DIAGNOSIS — I10 Essential (primary) hypertension: Secondary | ICD-10-CM

## 2015-08-01 DIAGNOSIS — G2581 Restless legs syndrome: Secondary | ICD-10-CM

## 2015-08-01 DIAGNOSIS — G4700 Insomnia, unspecified: Secondary | ICD-10-CM | POA: Diagnosis not present

## 2015-08-01 DIAGNOSIS — F32A Depression, unspecified: Secondary | ICD-10-CM

## 2015-08-01 DIAGNOSIS — E785 Hyperlipidemia, unspecified: Secondary | ICD-10-CM

## 2015-08-01 MED ORDER — OMEPRAZOLE 40 MG PO CPDR: 40.0000 mg | DELAYED_RELEASE_CAPSULE | Freq: Every day | ORAL | Status: DC

## 2015-08-01 MED ORDER — LISINOPRIL-HYDROCHLOROTHIAZIDE 20-12.5 MG PO TABS: 2.0000 | ORAL_TABLET | Freq: Every day | ORAL | Status: DC

## 2015-08-01 MED ORDER — FESOTERODINE FUMARATE ER 8 MG PO TB24: 8.0000 mg | ORAL_TABLET | Freq: Every day | ORAL | Status: DC

## 2015-08-01 MED ORDER — ATORVASTATIN CALCIUM 40 MG PO TABS: 40.0000 mg | ORAL_TABLET | Freq: Every day | ORAL | Status: DC

## 2015-08-01 MED ORDER — TOPIRAMATE 50 MG PO TABS: 50.0000 mg | ORAL_TABLET | Freq: Three times a day (TID) | ORAL | Status: DC

## 2015-08-01 MED ORDER — ELETRIPTAN HYDROBROMIDE 20 MG PO TABS: 40.0000 mg | ORAL_TABLET | ORAL | Status: DC | PRN

## 2015-08-01 MED ORDER — GABAPENTIN 300 MG PO CAPS: 300.0000 mg | ORAL_CAPSULE | Freq: Three times a day (TID) | ORAL | Status: DC

## 2015-08-01 MED ORDER — PRAMIPEXOLE DIHYDROCHLORIDE 1 MG PO TABS: 1.0000 mg | ORAL_TABLET | Freq: Two times a day (BID) | ORAL | Status: DC

## 2015-08-01 MED ORDER — DULOXETINE HCL 60 MG PO CPEP: 60.0000 mg | ORAL_CAPSULE | Freq: Every day | ORAL | Status: DC

## 2015-08-01 NOTE — Patient Instructions (Signed)

## 2015-08-01 NOTE — Progress Notes (Signed)
Subjective:    Patient ID: Marie Carter, female    DOB: [birth year], 55 y.o.   MRN: [id number]   Patient here today for follow up of chronic medical problems.  Outpatient Encounter Prescriptions as of 08/01/2015  Medication Sig  . albuterol (PROVENTIL HFA;VENTOLIN HFA) 108 (90 BASE) MCG/ACT inhaler Inhale 2 puffs into the lungs every 6 (six) hours as needed for wheezing.  [name] atorvastatin (LIPITOR) 40 MG tablet TAKE 1 TABLET DAILY  . baclofen (LIORESAL) 10 MG tablet Take 1 tablet (10 mg total) by mouth 3 (three) times daily.  . DULoxetine (CYMBALTA) 60 MG capsule Take 1 capsule (60 mg total) by mouth daily.  [name] eletriptan (RELPAX) 20 MG tablet Take 2 tablets (40 mg total) by mouth as needed. may repeat in 2 hours if necessary  . EPINEPHrine 0.3 mg/0.3 mL IJ SOAJ injection Inject 0.3 mLs (0.3 mg total) into the muscle once.  . gabapentin (NEURONTIN) 300 MG capsule TAKE 2 CAPSULES THREE TIMES A DAY  . guaiFENesin-codeine 100-10 MG/5ML syrup Take 5-10 mLs by mouth 3 (three) times daily as needed for cough.  [name] lisinopril-hydrochlorothiazide (ZESTORETIC) 20-12.5 MG tablet Take 2 tablets by mouth daily.  [name] omeprazole (PRILOSEC) 40 MG capsule Take 1 capsule (40 mg total) by mouth daily.  . pramipexole (MIRAPEX) 1 MG tablet TAKE 1 TABLET TWICE A DAY  . topiramate (TOPAMAX) 50 MG tablet TAKE 1 TABLET THREE TIMES A DAY  . TOVIAZ 8 MG TB24 tablet TAKE 1 TABLET DAILY  . traMADol (ULTRAM) 50 MG tablet Take 1 tablet (50 mg total) by mouth 3 (three) times daily as needed.  . zolpidem (AMBIEN) 10 MG tablet Take 1 tablet (10 mg total) by mouth at bedtime.   No facility-administered encounter medications on file as of 08/01/2015.   * patient said that she fel last week and injured her right knee and had to go to the ER because she could not put any weight on her right leg- needs referral to ortho.  .Hypertension This is a chronic problem. The current episode started more than 1 year ago. The problem is  unchanged. The problem is uncontrolled. Risk factors for coronary artery disease include dyslipidemia, obesity, post-menopausal state and sedentary lifestyle. Past treatments include ACE inhibitors and diuretics (changed meds at last viist but [facility] still has not sent new med dose- did not take meds today.). The current treatment provides moderate improvement. Compliance problems include diet and exercise.  Hypertensive end-organ damage includes CVA. There is no history of CAD/MI.  Hyperlipidemia This is a chronic problem. The current episode started more than 1 year ago. Recent lipid tests were reviewed and are variable. Exacerbating diseases include obesity. She has no history of diabetes or hypothyroidism. Pertinent negatives include no myalgias. Current antihyperlipidemic treatment includes statins. The current treatment provides moderate improvement of lipids. Compliance problems include adherence to diet and adherence to exercise.  Risk factors for coronary artery disease include dyslipidemia, hypertension, obesity and post-menopausal.  fibromyalgia Currently on cymbalta and muscle relaxor- doing okay always has some pain but continues to work through it- uses and occasional ultram migraine Has cut down on caffeine and that has really helped  Her frequency of migraines- still taking topamax and occasional relpax. CVA history/left sided weakness Doing well still walks with a cane RLS mirapex working well GERD Currently on omeprazole daily which works well to keep symptoms under control Insomnia ambien nightly- cannot sleep without taking depression cymbalta helping- no crying daily like she use  to urinary urgency Marie Carter helps urgency- no incontinence     Review of Systems  Constitutional: Negative.   HENT: Negative.   Respiratory: Negative.   Cardiovascular: Negative.   Gastrointestinal: Negative.   Genitourinary: Negative.   Musculoskeletal: Negative for myalgias.    Neurological: Negative.   Psychiatric/Behavioral: Negative.   All other systems reviewed and are negative.      Objective:   Physical Exam  Constitutional: She is oriented to person, place, and time. She appears well-developed and well-nourished.  HENT:  Nose: Nose normal.  Mouth/Throat: Oropharynx is clear and moist.  Eyes: EOM are normal.  Neck: Trachea normal, normal range of motion and full passive range of motion without pain. Neck supple. No JVD present. Carotid bruit is not present. No thyromegaly present.  Cardiovascular: Normal rate, regular rhythm, normal heart sounds and intact distal pulses.  Exam reveals no gallop and no friction rub.   No murmur heard. Pulmonary/Chest: Effort normal and breath sounds normal.  Abdominal: Soft. Bowel sounds are normal. She exhibits no distension and no mass. There is no tenderness.  Musculoskeletal: Normal range of motion.  Large right knee effusion- pain with any movement Ligaments not assessed due to pain  Lymphadenopathy:    She has no cervical adenopathy.  Neurological: She is alert and oriented to person, place, and time. She has normal reflexes.  Skin: Skin is warm and dry.  Psychiatric: She has a normal mood and affect. Her behavior is normal. Judgment and thought content normal.   BP 146/102 mmHg  Pulse 87  Temp(Src) 96.7 F (35.9 C) (Oral)  Ht _0  (1.549 m)  Wt 242 lb (109.77 kg)  BMI 45.75 kg/m2  LMP 09/13/2012       Assessment & Plan:   1. Essential hypertension Do not add salt to diet - lisinopril-hydrochlorothiazide (ZESTORETIC) 20-12.5 MG tablet; Take 2 tablets by mouth daily.  Dispense: 180 tablet; Refill: 1 - CMP14+EGFR  2. Gastroesophageal reflux disease without esophagitis Avoid spicy foods Do not eat 2 hours prior to bedtime - omeprazole (PRILOSEC) 40 MG capsule; Take 1 capsule (40 mg total) by mouth daily.  Dispense: 90 capsule; Refill: 1  3. Cerebral infarction due to cerebral artery occlusion  (HCC)  4. Fibromyalgia Unable to exercise right now due to right kne pain - DULoxetine (CYMBALTA) 60 MG capsule; Take 1 capsule (60 mg total) by mouth daily.  Dispense: 90 capsule; Refill: 1 - gabapentin (NEURONTIN) 300 MG capsule; Take 1 capsule (300 mg total) by mouth 3 (three) times daily.  Dispense: 540 capsule; Refill: 1  5. Hyperlipidemia Low fat diet - atorvastatin (LIPITOR) 40 MG tablet; Take 1 tablet (40 mg total) by mouth daily.  Dispense: 90 tablet; Refill: 1 - Lipid panel  6. Depression Stress management  7. Insomnia Bedtime ritual  8. Morbid obesity, unspecified obesity type ([place]) Discussed diet and exercise for person with BMI >25 Will recheck weight in 3-6 months  9. RLS (restless legs syndrome) Keep legs warm at night - eletriptan (RELPAX) 20 MG tablet; Take 2 tablets (40 mg total) by mouth as needed. may repeat in 2 hours if necessary  Dispense: 10 tablet; Refill: 5 - pramipexole (MIRAPEX) 1 MG tablet; Take 1 tablet (1 mg total) by mouth 2 (two) times daily.  Dispense: 180 tablet; Refill: 1  10. Migraine without aura and without status migrainosus, not intractable Avoid caffeine - topiramate (TOPAMAX) 50 MG tablet; Take 1 tablet (50 mg total) by mouth 3 (three) times daily.  Dispense:  270 tablet; Refill: 1  11. Urinary urgency - fesoterodine (TOVIAZ) 8 MG TB24 tablet; Take 1 tablet (8 mg total) by mouth daily.  Dispense: 90 tablet; Refill: 1  12. Right knee pain Continue to use walker to prevent falls - Ambulatory referral to Orthopedic Surgery    Labs pending Health maintenance reviewed Diet and exercise encouraged Continue all meds Follow up  In 6 months   [place], FNP

## 2015-08-02 LAB — SPECIMEN STATUS

## 2015-08-05 LAB — CMP14+EGFR
ALK PHOS: 129 IU/L — AB (ref 39–117)
ALT: 23 IU/L (ref 0–32)
AST: 12 IU/L (ref 0–40)
Albumin/Globulin Ratio: 1.3 (ref 1.1–2.5)
Albumin: 4.3 g/dL (ref 3.5–5.5)
BUN/Creatinine Ratio: 15 (ref 9–23)
BUN: 15 mg/dL (ref 6–24)
Bilirubin Total: 0.5 mg/dL (ref 0.0–1.2)
CO2: 24 mmol/L (ref 18–29)
CREATININE: 0.99 mg/dL (ref 0.57–1.00)
Calcium: 9.8 mg/dL (ref 8.7–10.2)
Chloride: 100 mmol/L (ref 96–106)
GFR calc Af Amer: 74 mL/min/{1.73_m2} (ref >59)
GFR calc non Af Amer: 64 mL/min/{1.73_m2} (ref >59)
GLOBULIN, TOTAL: 3.2 g/dL (ref 1.5–4.5)
GLUCOSE: 97 mg/dL (ref 65–99)
Potassium: 4.1 mmol/L (ref 3.5–5.2)
SODIUM: 142 mmol/L (ref 134–144)
Total Protein: 7.5 g/dL (ref 6.0–8.5)

## 2015-08-05 LAB — LIPID PANEL
Chol/HDL Ratio: 3.5 ratio units (ref 0.0–4.4)
Cholesterol, Total: 214 mg/dL — ABNORMAL HIGH (ref 100–199)
HDL: 62 mg/dL (ref >39)
LDL CALC: 133 mg/dL — AB (ref 0–99)
TRIGLYCERIDES: 94 mg/dL (ref 0–149)
VLDL Cholesterol Cal: 19 mg/dL (ref 5–40)

## 2015-11-01 ENCOUNTER — Encounter: Payer: Self-pay | Admitting: Nurse Practitioner

## 2015-11-01 ENCOUNTER — Ambulatory Visit (INDEPENDENT_AMBULATORY_CARE_PROVIDER_SITE_OTHER): Admitting: Nurse Practitioner

## 2015-11-01 DIAGNOSIS — G43009 Migraine without aura, not intractable, without status migrainosus: Secondary | ICD-10-CM

## 2015-11-01 DIAGNOSIS — G4700 Insomnia, unspecified: Secondary | ICD-10-CM | POA: Diagnosis not present

## 2015-11-01 DIAGNOSIS — I6350 Cerebral infarction due to unspecified occlusion or stenosis of unspecified cerebral artery: Secondary | ICD-10-CM | POA: Diagnosis not present

## 2015-11-01 DIAGNOSIS — E785 Hyperlipidemia, unspecified: Secondary | ICD-10-CM

## 2015-11-01 DIAGNOSIS — R3915 Urgency of urination: Secondary | ICD-10-CM | POA: Diagnosis not present

## 2015-11-01 DIAGNOSIS — I10 Essential (primary) hypertension: Secondary | ICD-10-CM

## 2015-11-01 DIAGNOSIS — G2581 Restless legs syndrome: Secondary | ICD-10-CM | POA: Diagnosis not present

## 2015-11-01 DIAGNOSIS — K219 Gastro-esophageal reflux disease without esophagitis: Secondary | ICD-10-CM

## 2015-11-01 DIAGNOSIS — F329 Major depressive disorder, single episode, unspecified: Secondary | ICD-10-CM

## 2015-11-01 DIAGNOSIS — M797 Fibromyalgia: Secondary | ICD-10-CM | POA: Diagnosis not present

## 2015-11-01 DIAGNOSIS — F32A Depression, unspecified: Secondary | ICD-10-CM

## 2015-11-01 NOTE — Patient Instructions (Signed)
Fall Prevention in the Home  Falls can cause injuries and can affect people from all age groups. There are many simple things that you can do to make your home safe and to help prevent falls. WHAT CAN I DO ON THE OUTSIDE OF MY HOME?  Regularly repair the edges of walkways and driveways and fix any cracks.  Remove high doorway thresholds.  Trim any shrubbery on the main path into your home.  Use bright outdoor lighting.  Clear walkways of debris and clutter, including tools and rocks.  Regularly check that handrails are securely fastened and in good repair. Both sides of any steps should have handrails.  Install guardrails along the edges of any raised decks or porches.  Have leaves, snow, and ice cleared regularly.  Use sand or salt on walkways during winter months.  In the garage, clean up any spills right away, including grease or oil spills. WHAT CAN I DO IN THE BATHROOM?  Use night lights.  Install grab bars by the toilet and in the tub and shower. Do not use towel bars as grab bars.  Use non-skid mats or decals on the floor of the tub or shower.  If you need to sit down while you are in the shower, use a plastic, non-slip stool..  Keep the floor dry. Immediately clean up any water that spills on the floor.  Remove soap buildup in the tub or shower on a regular basis.  Attach bath mats securely with double-sided non-slip rug tape.  Remove throw rugs and other tripping hazards from the floor. WHAT CAN I DO IN THE BEDROOM?  Use night lights.  Make sure that a bedside light is easy to reach.  Do not use oversized bedding that drapes onto the floor.  Have a firm chair that has side arms to use for getting dressed.  Remove throw rugs and other tripping hazards from the floor. WHAT CAN I DO IN THE KITCHEN?   Clean up any spills right away.  Avoid walking on wet floors.  Place frequently used items in easy-to-reach places.  If you need to reach for something  above you, use a sturdy step stool that has a grab bar.  Keep electrical cables out of the way.  Do not use floor polish or wax that makes floors slippery. If you have to use wax, make sure that it is non-skid floor wax.  Remove throw rugs and other tripping hazards from the floor. WHAT CAN I DO IN THE STAIRWAYS?  Do not leave any items on the stairs.  Make sure that there are handrails on both sides of the stairs. Fix handrails that are broken or loose. Make sure that handrails are as long as the stairways.  Check any carpeting to make sure that it is firmly attached to the stairs. Fix any carpet that is loose or worn.  Avoid having throw rugs at the top or bottom of stairways, or secure the rugs with carpet tape to prevent them from moving.  Make sure that you have a light switch at the top of the stairs and the bottom of the stairs. If you do not have them, have them installed. WHAT ARE SOME OTHER FALL PREVENTION TIPS?  Wear closed-toe shoes that fit well and support your feet. Wear shoes that have rubber soles or low heels.  When you use a stepladder, make sure that it is completely opened and that the sides are firmly locked. Have someone hold the ladder while you   are using it. Do not climb a closed stepladder.  Add color or contrast paint or tape to grab bars and handrails in your home. Place contrasting color strips on the first and last steps.  Use mobility aids as needed, such as canes, walkers, scooters, and crutches.  Turn on lights if it is dark. Replace any light bulbs that burn out.  Set up furniture so that there are clear paths. Keep the furniture in the same spot.  Fix any uneven floor surfaces.  Choose a carpet design that does not hide the edge of steps of a stairway.  Be aware of any and all pets.  Review your medicines with your healthcare provider. Some medicines can cause dizziness or changes in blood pressure, which increase your risk of falling. Talk  with your health care provider about other ways that you can decrease your risk of falls. This may include working with a physical therapist or trainer to improve your strength, balance, and endurance.   This information is not intended to replace advice given to you by your health care provider. Make sure you discuss any questions you have with your health care provider.   Document Released: 05/08/2002 Document Revised: 10/02/2014 Document Reviewed: 06/22/2014 Elsevier Interactive Patient Education 2016 [employer]  

## 2015-11-01 NOTE — Progress Notes (Signed)
Subjective:    Patient ID: Marie Carter, female    DOB: 04/02/1961, 55 y.o.   MRN: [id number]   Patient here today for follow up of chronic medical problems.  Outpatient Encounter Prescriptions as of 11/01/2015  Medication Sig  . albuterol (PROVENTIL HFA;VENTOLIN HFA) 108 (90 BASE) MCG/ACT inhaler Inhale 2 puffs into the lungs every 6 (six) hours as needed for wheezing.  [name] atorvastatin (LIPITOR) 40 MG tablet Take 1 tablet (40 mg total) by mouth daily.  . baclofen (LIORESAL) 10 MG tablet Take 1 tablet (10 mg total) by mouth 3 (three) times daily.  . DULoxetine (CYMBALTA) 60 MG capsule Take 1 capsule (60 mg total) by mouth daily.  [name] eletriptan (RELPAX) 20 MG tablet Take 2 tablets (40 mg total) by mouth as needed. may repeat in 2 hours if necessary  . EPINEPHrine 0.3 mg/0.3 mL IJ SOAJ injection Inject 0.3 mLs (0.3 mg total) into the muscle once.  . fesoterodine (TOVIAZ) 8 MG TB24 tablet Take 1 tablet (8 mg total) by mouth daily.  [name] gabapentin (NEURONTIN) 300 MG capsule Take 1 capsule (300 mg total) by mouth 3 (three) times daily.  [name] guaiFENesin-codeine 100-10 MG/5ML syrup Take 5-10 mLs by mouth 3 (three) times daily as needed for cough.  [name] lisinopril-hydrochlorothiazide (ZESTORETIC) 20-12.5 MG tablet Take 2 tablets by mouth daily.  [name] omeprazole (PRILOSEC) 40 MG capsule Take 1 capsule (40 mg total) by mouth daily.  . pramipexole (MIRAPEX) 1 MG tablet Take 1 tablet (1 mg total) by mouth 2 (two) times daily.  [name] topiramate (TOPAMAX) 50 MG tablet Take 1 tablet (50 mg total) by mouth 3 (three) times daily.  . traMADol (ULTRAM) 50 MG tablet Take 1 tablet (50 mg total) by mouth 3 (three) times daily as needed.  . zolpidem (AMBIEN) 10 MG tablet Take 1 tablet (10 mg total) by mouth at bedtime.   No facility-administered encounter medications on file as of 11/01/2015.    Marland KitchenHyperlipidemia This is a chronic problem. The current episode started more than 1 year ago. Recent lipid tests were reviewed and are  variable. Exacerbating diseases include obesity. She has no history of diabetes. Current antihyperlipidemic treatment includes statins. The current treatment provides moderate improvement of lipids. Compliance problems include adherence to diet and adherence to exercise.  Risk factors for coronary artery disease include dyslipidemia, hypertension, obesity and post-menopausal.  Hypertension This is a chronic problem. The current episode started more than 1 year ago. The problem is unchanged. The problem is uncontrolled. Risk factors for coronary artery disease include dyslipidemia, obesity, post-menopausal state and sedentary lifestyle. Past treatments include ACE inhibitors and diuretics (changed meds at [facility] but [facility] not [facility] new med dose- did not take meds today.). The current treatment provides moderate improvement. Compliance problems include diet and exercise.  Hypertensive end-organ damage includes CVA. There is no history of CAD/MI.  fibromyalgia Currently on cymbalta and muscle relaxor- doing okay always has some pain but continues to work through it- uses and occasional ultram migraine Has cut down on caffeine and that has really helped  Her frequency of migraines- still taking topamax and occasional relpax. CVA history/left sided weakness Doing well still walks with a cane RLS mirapex working well GERD Currently on omeprazole daily which works well to keep symptoms under control Insomnia ambien nightly- cannot sleep without taking depression cymbalta helping- no crying daily like she use to. Patient says depression is worse when she is hurting a lot. urinary urgency [name]  helps urgency- no incontinence     Review of Systems  Constitutional: Negative.   HENT: Negative.   Respiratory: Negative.   Cardiovascular: Negative.   Gastrointestinal: Negative.   Genitourinary: Negative.   Neurological: Negative.   All other systems reviewed and are  negative.      Objective:   Physical Exam  Constitutional: She is oriented to person, place, and time. She appears well-developed and well-nourished.  HENT:  Nose: Nose normal.  Mouth/Throat: Oropharynx is clear and moist.  Eyes: EOM are normal.  Neck: Trachea normal, normal range of motion and full passive range of motion without pain. Neck supple. No JVD present. Carotid bruit is not present. No thyromegaly present.  Cardiovascular: Normal rate, regular rhythm, normal heart sounds and intact distal pulses.  Exam reveals no gallop and no friction rub.   No murmur heard. Pulmonary/Chest: Effort normal and breath sounds normal.  Abdominal: Soft. Bowel sounds are normal. She exhibits no distension and no mass. There is no tenderness.  Musculoskeletal: Normal range of motion.  Walking with walker  Lymphadenopathy:    She has no cervical adenopathy.  Neurological: She is alert and oriented to person, place, and time. She has normal reflexes.  Skin: Skin is warm and dry.  Psychiatric: She has a normal mood and affect. Her behavior is normal. Judgment and thought content normal.   BP 134/72 mmHg  Pulse 78  Temp(Src) 97.5 F (36.4 C) (Oral)  Ht 5' 1" (1.549 m)  Wt 244 lb 3.2 oz (110.768 kg)  BMI 46.16 kg/m2  LMP 09/13/2012  Weight down 11 lbs since last visit     Assessment & Plan:   1. Morbid obesity, unspecified obesity type ([place])   2. Depression   3. Hyperlipidemia   4. Insomnia   5. Urinary urgency   6. RLS (restless legs syndrome)   7. Fibromyalgia   8. Cerebral infarction due to cerebral artery occlusion (HCC)   9. Gastroesophageal reflux disease without esophagitis   10. Essential hypertension   11. Migraine without aura and without status migrainosus, not intractable    Orders Placed This Encounter  Procedures  . CMP14+EGFR  . Lipid panel   No orders of the defined types were placed in this encounter.   Continue all other meds Labs pending Low fat diet  encouraged Health Maintenance reviewed RTO prn  Mary-Margaret Hassell Done, FNP

## 2015-11-02 LAB — CMP14+EGFR
A/G RATIO: 1.2 (ref 1.2–2.2)
ALT: 16 IU/L (ref 0–32)
AST: 12 IU/L (ref 0–40)
Albumin: 4 g/dL (ref 3.5–5.5)
Alkaline Phosphatase: 110 IU/L (ref 39–117)
BILIRUBIN TOTAL: 0.4 mg/dL (ref 0.0–1.2)
BUN/Creatinine Ratio: 20 (ref 9–23)
BUN: 20 mg/dL (ref 6–24)
CHLORIDE: 103 mmol/L (ref 96–106)
CO2: 25 mmol/L (ref 18–29)
Calcium: 9.3 mg/dL (ref 8.7–10.2)
Creatinine, Ser: 0.98 mg/dL (ref 0.57–1.00)
GFR calc Af Amer: 75 mL/min/{1.73_m2} (ref >59)
GFR calc non Af Amer: 65 mL/min/{1.73_m2} (ref >59)
GLOBULIN, TOTAL: 3.3 g/dL (ref 1.5–4.5)
Glucose: 79 mg/dL (ref 65–99)
POTASSIUM: 4 mmol/L (ref 3.5–5.2)
SODIUM: 146 mmol/L — AB (ref 134–144)
Total Protein: 7.3 g/dL (ref 6.0–8.5)

## 2015-11-02 LAB — LIPID PANEL
CHOL/HDL RATIO: 3.1 ratio (ref 0.0–4.4)
Cholesterol, Total: 214 mg/dL — ABNORMAL HIGH (ref 100–199)
HDL: 68 mg/dL (ref >39)
LDL Calculated: 129 mg/dL — ABNORMAL HIGH (ref 0–99)
TRIGLYCERIDES: 83 mg/dL (ref 0–149)
VLDL Cholesterol Cal: 17 mg/dL (ref 5–40)

## 2015-11-07 ENCOUNTER — Telehealth: Payer: Self-pay | Admitting: Nurse Practitioner

## 2015-11-07 MED ORDER — BACLOFEN 10 MG PO TABS: 10.0000 mg | ORAL_TABLET | Freq: Three times a day (TID) | ORAL | Status: DC

## 2015-11-07 NOTE — Telephone Encounter (Signed)
done

## 2015-12-07 ENCOUNTER — Other Ambulatory Visit: Payer: Self-pay | Admitting: Pediatrics

## 2016-01-29 ENCOUNTER — Other Ambulatory Visit: Payer: Self-pay | Admitting: Nurse Practitioner

## 2016-01-29 DIAGNOSIS — K219 Gastro-esophageal reflux disease without esophagitis: Secondary | ICD-10-CM

## 2016-01-29 DIAGNOSIS — M797 Fibromyalgia: Secondary | ICD-10-CM

## 2016-02-04 ENCOUNTER — Ambulatory Visit (INDEPENDENT_AMBULATORY_CARE_PROVIDER_SITE_OTHER): Admitting: Nurse Practitioner

## 2016-02-04 ENCOUNTER — Encounter: Payer: Self-pay | Admitting: Nurse Practitioner

## 2016-02-04 VITALS — BP 116/85 | HR 89 | Temp 97.7°F

## 2016-02-04 DIAGNOSIS — E785 Hyperlipidemia, unspecified: Secondary | ICD-10-CM | POA: Diagnosis not present

## 2016-02-04 DIAGNOSIS — G2581 Restless legs syndrome: Secondary | ICD-10-CM | POA: Diagnosis not present

## 2016-02-04 DIAGNOSIS — N950 Postmenopausal bleeding: Secondary | ICD-10-CM | POA: Diagnosis not present

## 2016-02-04 DIAGNOSIS — M797 Fibromyalgia: Secondary | ICD-10-CM

## 2016-02-04 DIAGNOSIS — G43009 Migraine without aura, not intractable, without status migrainosus: Secondary | ICD-10-CM | POA: Diagnosis not present

## 2016-02-04 DIAGNOSIS — I10 Essential (primary) hypertension: Secondary | ICD-10-CM | POA: Diagnosis not present

## 2016-02-04 DIAGNOSIS — R3915 Urgency of urination: Secondary | ICD-10-CM | POA: Diagnosis not present

## 2016-02-04 DIAGNOSIS — F329 Major depressive disorder, single episode, unspecified: Secondary | ICD-10-CM

## 2016-02-04 DIAGNOSIS — K219 Gastro-esophageal reflux disease without esophagitis: Secondary | ICD-10-CM

## 2016-02-04 DIAGNOSIS — G4700 Insomnia, unspecified: Secondary | ICD-10-CM | POA: Diagnosis not present

## 2016-02-04 DIAGNOSIS — J4520 Mild intermittent asthma, uncomplicated: Secondary | ICD-10-CM

## 2016-02-04 DIAGNOSIS — F32A Depression, unspecified: Secondary | ICD-10-CM

## 2016-02-04 MED ORDER — MEDROXYPROGESTERONE ACETATE 10 MG PO TABS: 10.0000 mg | ORAL_TABLET | Freq: Every day | ORAL | 0 refills | Status: DC

## 2016-02-04 MED ORDER — FESOTERODINE FUMARATE ER 8 MG PO TB24: 8.0000 mg | ORAL_TABLET | Freq: Every day | ORAL | 1 refills | Status: DC

## 2016-02-04 MED ORDER — ZOLPIDEM TARTRATE 10 MG PO TABS: 10.0000 mg | ORAL_TABLET | Freq: Every day | ORAL | 1 refills | Status: DC

## 2016-02-04 MED ORDER — PRAMIPEXOLE DIHYDROCHLORIDE 1 MG PO TABS: 1.0000 mg | ORAL_TABLET | Freq: Two times a day (BID) | ORAL | 1 refills | Status: DC

## 2016-02-04 MED ORDER — OMEPRAZOLE 40 MG PO CPDR: 40.0000 mg | DELAYED_RELEASE_CAPSULE | Freq: Every day | ORAL | 1 refills | Status: DC

## 2016-02-04 MED ORDER — LISINOPRIL-HYDROCHLOROTHIAZIDE 20-12.5 MG PO TABS: 2.0000 | ORAL_TABLET | Freq: Every day | ORAL | 1 refills | Status: DC

## 2016-02-04 MED ORDER — DULOXETINE HCL 60 MG PO CPEP: 60.0000 mg | ORAL_CAPSULE | Freq: Every day | ORAL | 1 refills | Status: DC

## 2016-02-04 MED ORDER — ATORVASTATIN CALCIUM 40 MG PO TABS: 40.0000 mg | ORAL_TABLET | Freq: Every day | ORAL | 1 refills | Status: DC

## 2016-02-04 MED ORDER — TRAMADOL HCL 50 MG PO TABS: 50.0000 mg | ORAL_TABLET | Freq: Three times a day (TID) | ORAL | 0 refills | Status: DC | PRN

## 2016-02-04 MED ORDER — GABAPENTIN 300 MG PO CAPS: 300.0000 mg | ORAL_CAPSULE | Freq: Three times a day (TID) | ORAL | 1 refills | Status: DC

## 2016-02-04 MED ORDER — TOPIRAMATE 50 MG PO TABS: 50.0000 mg | ORAL_TABLET | Freq: Three times a day (TID) | ORAL | 1 refills | Status: DC

## 2016-02-04 MED ORDER — TRAMADOL HCL 50 MG PO TABS: 50.0000 mg | ORAL_TABLET | Freq: Three times a day (TID) | ORAL | 1 refills | Status: DC | PRN

## 2016-02-04 NOTE — Addendum Note (Signed)
Addended by: Bennie PieriniMARTIN, MARY-MARGARET on: 02/04/2016 12:15 PM   Modules accepted: Orders

## 2016-02-04 NOTE — Progress Notes (Addendum)
Subjective:    Patient ID: Marie Carter, female    DOB: 1961-05-23, 55 y.o.   MRN: [id number]   Patient here today for follow up of chronic medical problems.   Outpatient Encounter Prescriptions as of 02/04/2016  Medication Sig  . atorvastatin (LIPITOR) 40 MG tablet Take 1 tablet (40 mg total) by mouth daily.  . baclofen (LIORESAL) 10 MG tablet Take 1 tablet (10 mg total) by mouth 3 (three) times daily.  . DULoxetine (CYMBALTA) 60 MG capsule TAKE 1 CAPSULE DAILY  . eletriptan (RELPAX) 20 MG tablet Take 2 tablets (40 mg total) by mouth as needed. may repeat in 2 hours if necessary  . EPINEPHrine 0.3 mg/0.3 mL IJ SOAJ injection Inject 0.3 mLs (0.3 mg total) into the muscle once.  . fesoterodine (TOVIAZ) 8 MG TB24 tablet Take 1 tablet (8 mg total) by mouth daily.  [name] gabapentin (NEURONTIN) 300 MG capsule Take 1 capsule (300 mg total) by mouth 3 (three) times daily.  [name] lisinopril-hydrochlorothiazide (ZESTORETIC) 20-12.5 MG tablet Take 2 tablets by mouth daily.  [name] omeprazole (PRILOSEC) 40 MG capsule TAKE 1 CAPSULE DAILY  . pramipexole (MIRAPEX) 1 MG tablet Take 1 tablet (1 mg total) by mouth 2 (two) times daily.  [name] PROAIR HFA 108 (90 Base) MCG/ACT inhaler USE 2 INHALATIONS EVERY 6 HOURS AS NEEDED FOR WHEEZING  . topiramate (TOPAMAX) 50 MG tablet Take 1 tablet (50 mg total) by mouth 3 (three) times daily.  . traMADol (ULTRAM) 50 MG tablet Take 1 tablet (50 mg total) by mouth 3 (three) times daily as needed.  . zolpidem (AMBIEN) 10 MG tablet Take 1 tablet (10 mg total) by mouth at bedtime.   * Patient has not had a period in over 2 years- started periord 10 days ago nad it is brown and foul smelling with lots of cramping.  Marland KitchenHypertension  This is a chronic problem. The current episode started more than 1 year ago. The problem is unchanged. The problem is uncontrolled. Risk factors for coronary artery disease include dyslipidemia, obesity, post-menopausal state and sedentary lifestyle. Past  treatments include ACE inhibitors and diuretics (changed meds at [facility] not [facility] new med dose- did not take meds today.). The current treatment provides moderate improvement. Compliance problems include diet and exercise.  Hypertensive end-organ damage includes CVA. There is no history of CAD/MI.  Hyperlipidemia  This is a chronic problem. The current episode started more than 1 year ago. Recent lipid tests were reviewed and are variable. Exacerbating diseases include obesity. She has no history of diabetes or hypothyroidism. Pertinent negatives include no myalgias. Current antihyperlipidemic treatment includes statins. The current treatment provides moderate improvement of lipids. Compliance problems include adherence to diet and adherence to exercise.  Risk factors for coronary artery disease include dyslipidemia, hypertension, obesity and post-menopausal.  fibromyalgia Currently on cymbalta and muscle relaxor- doing okay always has some pain but continues to work through it- uses and occasional ultram. Pain assessment: Cause of pain- Fibromyalgia Pain location- worse in shoulder areas Pain on scale of 1-10- 7/10 Frequency- daily What increases pain- doing repetitive work What makes pain Better- resting helps Effects on ADL - some days cannot do anything Any change in general medical condition--- none  Current medications- ultram Effectiveness of current meds-helps ease pain but des not completely take away Adverse reactions form pain meds-- none Morphine equivalent 15.0  Pill count performed-No Urine drug screen- No Was the [place] reviewed- yes  If yes were  their any concerning findings? - no suspicious activity  migraine Has cut down on caffeine and that has really helped  Her frequency of migraines- still taking topamax and occasional relpax. CVA history/left sided weakness Doing well still walks with a cane RLS mirapex working well GERD Currently  on omeprazole daily which works well to keep symptoms under control Insomnia ambien nightly- cannot sleep without taking depression cymbalta helping- no crying daily like she use to urinary urgency [name] helps urgency- no incontinence     Review of Systems  Constitutional: Negative.   HENT: Negative.   Respiratory: Negative.   Cardiovascular: Negative.   Gastrointestinal: Negative.   Genitourinary: Negative.   Musculoskeletal: Negative for myalgias.  Neurological: Negative.   Psychiatric/Behavioral: Negative.   All other systems reviewed and are negative.      Objective:   Physical Exam  Constitutional: She is oriented to person, place, and time. She appears well-developed and well-nourished.  HENT:  Nose: Nose normal.  Mouth/Throat: Oropharynx is clear and moist.  Eyes: EOM are normal.  Neck: Trachea normal, normal range of motion and full passive range of motion without pain. Neck supple. No JVD present. Carotid bruit is not present. No thyromegaly present.  Cardiovascular: Normal rate, regular rhythm, normal heart sounds and intact distal pulses.  Exam reveals no gallop and no friction rub.   No murmur heard. Pulmonary/Chest: Effort normal and breath sounds normal.  Abdominal: Soft. Bowel sounds are normal. She exhibits no distension and no mass. There is no tenderness.  Musculoskeletal: Normal range of motion. She exhibits edema (1+edema bil lowe rext.).  Multiple tender points up and down back  Lymphadenopathy:    She has no cervical adenopathy.  Neurological: She is alert and oriented to person, place, and time. She has normal reflexes.  Skin: Skin is warm and dry.  Psychiatric: She has a normal mood and affect. Her behavior is normal. Judgment and thought content normal.   BP 116/85   Pulse 89   Temp 97.7 F (36.5 C) (Oral)   LMP 09/13/2012        Assessment & Plan:  1. Essential hypertension Do not add salt to diet - lisinopril-hydrochlorothiazide  (ZESTORETIC) 20-12.5 MG tablet; Take 2 tablets by mouth daily.  Dispense: 180 tablet; Refill: 1 - CMP14+EGFR  2. Migraine without aura and without status migrainosus, not intractable Avoid caffeine - topiramate (TOPAMAX) 50 MG tablet; Take 1 tablet (50 mg total) by mouth 3 (three) times daily.  Dispense: 270 tablet; Refill: 1  3. Asthma, chronic, mild intermittent, uncomplicated  4. Gastroesophageal reflux disease without esophagitis Avoid spicy foods Do not eat 2 hours prior to bedtime - omeprazole (PRILOSEC) 40 MG capsule; Take 1 capsule (40 mg total) by mouth daily.  Dispense: 90 capsule; Refill: 1  5. Fibromyalgia Exercise when can to keep muscles warm - gabapentin (NEURONTIN) 300 MG capsule; Take 1 capsule (300 mg total) by mouth 3 (three) times daily.  Dispense: 540 capsule; Refill: 1 - DULoxetine (CYMBALTA) 60 MG capsule; Take 1 capsule (60 mg total) by mouth daily.  Dispense: 90 capsule; Refill: 1 - traMADol (ULTRAM) 50 MG tablet; Take 1 tablet (50 mg total) by mouth 3 (three) times daily as needed.  Dispense: 90 tablet; Refill: 1  6. Depression stressmanagement  7. Hyperlipidemia Low fat diet - atorvastatin (LIPITOR) 40 MG tablet; Take 1 tablet (40 mg total) by mouth daily.  Dispense: 90 tablet; Refill: 1 - Lipid panel  8. Insomnia Bedtime routine - zolpidem (AMBIEN) 10  MG tablet; Take 1 tablet (10 mg total) by mouth at bedtime.  Dispense: 90 tablet; Refill: 1  9. RLS (restless legs syndrome) Keep legs warm t night - pramipexole (MIRAPEX) 1 MG tablet; Take 1 tablet (1 mg total) by mouth 2 (two) times daily.  Dispense: 180 tablet; Refill: 1  10. Morbid obesity, unspecified obesity type ([place]) Discussed diet and exercise for person with BMI >25 Will recheck weight in 3-6 months  11. Urinary urgency - fesoterodine (TOVIAZ) 8 MG TB24 tablet; Take 1 tablet (8 mg total) by mouth daily.  Dispense: 90 tablet; Refill: 1  12. Post menopausal bleeding Provera 74m 1 po qd for  10 days will need to schedule endometrial bx when bleeding stops    Labs pending Health maintenance reviewed Diet and exercise encouraged Continue all meds Follow up  In 3 month   MSt. James FNP

## 2016-02-04 NOTE — Patient Instructions (Signed)
Urinary Frequency °The number of times a normal person urinates depends upon how much liquid they take in and how much liquid they are losing. If the temperature is hot and there is high humidity, then the person will sweat more and usually breathe a little more frequently. These factors decrease the amount of frequency of urination that would be considered normal. °The amount you drink is easily determined, but the amount of fluid lost is sometimes more difficult to calculate.  °Fluid is lost in two ways: °· Sensible fluid loss is usually measured by the amount of urine that you get rid of. Losses of fluid can also occur with diarrhea. °· Insensible fluid loss is more difficult to measure. It is caused by evaporation. Insensible loss of fluid occurs through breathing and sweating. It usually ranges from a little less than a quart to a little more than a quart of fluid a day. °In normal temperatures and activity levels, the average person may urinate 4 to 7 times in a 24-hour period. Needing to urinate more often than that could indicate a problem. If one urinates 4 to 7 times in 24 hours and has large volumes each time, that could indicate a different problem from one who urinates 4 to 7 times a day and has small volumes. The time of urinating is also important. Most urinating should be done during the waking hours. Getting up at night to urinate frequently can indicate some problems. °CAUSES  °The bladder is the organ in your lower abdomen that holds urine. Like a balloon, it swells some as it fills up. Your nerves sense this and tell you it is time to head for the bathroom. There are a number of reasons that you might feel the need to urinate more often than usual. They include: °· Urinary tract infection. This is usually associated with other signs such as burning when you urinate. °· In men, problems with the prostate (a walnut-size gland that is located near the tube that carries urine out of your body). There  are two reasons why the prostate can cause an increased frequency of urination: °¨ An enlarged prostate that does not let the bladder empty well. If the bladder only half empties when you urinate, then it only has half the capacity to fill before you have to urinate again. °¨ The nerves in the bladder become more hypersensitive with an increased size of the prostate even if the bladder empties completely. °· Pregnancy. °· Obesity. Excess weight is more likely to cause a problem for women than for men. °· Bladder stones or other bladder problems. °· Caffeine. °· Alcohol. °· Medications. For example, drugs that help the body get rid of extra fluid (diuretics) increase urine production. Some other medicines must be taken with lots of fluids. °· Muscle or nerve weakness. This might be the result of a spinal cord injury, a stroke, multiple sclerosis, or Parkinson disease. °· Long-standing diabetes can decrease the sensation of the bladder. This loss of sensation makes it harder to sense the bladder needs to be emptied. Over a period of years, the bladder is stretched out by constant overfilling. This weakens the bladder muscles so that the bladder does not empty well and has less capacity to fill with new urine. °· Interstitial cystitis (also called painful bladder syndrome). This condition develops because the tissues that line the inside of the bladder are inflamed (inflammation is the body's way of reacting to injury or infection). It causes pain and frequent   urination. It occurs in women more often than in men. °DIAGNOSIS  °· To decide what might be causing your urinary frequency, your health care provider will probably: °¨ Ask about symptoms you have noticed. °¨ Ask about your overall health. This will include questions about any medications you are taking. °¨ Do a physical examination. °· Order some tests. These might include: °¨ A blood test to check for diabetes or other health issues that could be contributing  to the problem. °¨ Urine testing. This could measure the flow of urine and the pressure on the bladder. °¨ A test of your neurological system (the brain, spinal cord, and nerves). This is the system that senses the need to urinate. °¨ A bladder test to check whether it is emptying completely when you urinate. °¨ Cystoscopy. This test uses a thin tube with a tiny camera on it. It offers a look inside your urethra and bladder to see if there are problems. °¨ Imaging tests. You might be given a contrast dye and then asked to urinate. X-rays are taken to see how your bladder is working. °TREATMENT  °It is important for you to be evaluated to determine if the amount or frequency that you have is unusual or abnormal. If it is found to be abnormal, the cause should be determined and this can usually be found out easily. Depending upon the cause, treatment could include medication, stimulation of the nerves, or surgery. °There are not too many things that you can do as an individual to change your urinary frequency. It is important that you balance the amount of fluid intake needed to compensate for your activity and the temperature. Medical problems will be diagnosed and taken care of by your physician. There is no particular bladder training such as Kegel exercises that you can do to help urinary frequency. This is an exercise that is usually recommended for people who have leaking of urine when they laugh, cough, or sneeze. °HOME CARE INSTRUCTIONS  °· Take any medications your health care provider prescribed or suggested. Follow the directions carefully. °· Practice any lifestyle changes that are recommended. These might include: °¨ Drinking less fluid or drinking at different times of the day. If you need to urinate often during the night, for example, you may need to stop drinking fluids early in the evening. °¨ Cutting down on caffeine or alcohol. They both can make you need to urinate more often than normal. Caffeine  is found in coffee, tea, and sodas. °¨ Losing weight, if that is recommended. °· Keep a journal or a log. You might be asked to record how much you drink and when and where you feel the need to urinate. This will also help evaluate how well the treatment provided by your physician is working. °SEEK MEDICAL CARE IF:  °· Your need to urinate often gets worse. °· You feel increased pain or irritation when you urinate. °· You notice blood in your urine. °· You have questions about any medications that your health care provider recommended. °· You notice blood, pus, or swelling at the site of any test or treatment procedure. °· You develop a fever of more than 100.5°F (38.1°C). °SEEK IMMEDIATE MEDICAL CARE IF:  °You develop a fever of more than 102.0°F (38.9°C). °  °This information is not intended to replace advice given to you by your health care provider. Make sure you discuss any questions you have with your health care provider. °  °Document Released: 03/14/2009 Document Revised:   06/08/2014 Document Reviewed: 03/14/2009 °Elsevier Interactive Patient Education ©2016 [employer] ° °

## 2016-02-05 ENCOUNTER — Telehealth: Payer: Self-pay | Admitting: Nurse Practitioner

## 2016-02-05 LAB — CMP14+EGFR
ALT: 14 IU/L (ref 0–32)
AST: 16 IU/L (ref 0–40)
Albumin/Globulin Ratio: 1.6 (ref 1.2–2.2)
Albumin: 4.3 g/dL (ref 3.5–5.5)
Alkaline Phosphatase: 110 IU/L (ref 39–117)
BUN/Creatinine Ratio: 15 (ref 9–23)
BUN: 17 mg/dL (ref 6–24)
Bilirubin Total: 0.3 mg/dL (ref 0.0–1.2)
CALCIUM: 9.3 mg/dL (ref 8.7–10.2)
CHLORIDE: 104 mmol/L (ref 96–106)
CO2: 23 mmol/L (ref 18–29)
Creatinine, Ser: 1.11 mg/dL — ABNORMAL HIGH (ref 0.57–1.00)
GFR, EST AFRICAN AMERICAN: 64 mL/min/{1.73_m2} (ref >59)
GFR, EST NON AFRICAN AMERICAN: 56 mL/min/{1.73_m2} — AB (ref >59)
GLUCOSE: 91 mg/dL (ref 65–99)
Globulin, Total: 2.7 g/dL (ref 1.5–4.5)
Potassium: 4.2 mmol/L (ref 3.5–5.2)
Sodium: 142 mmol/L (ref 134–144)
TOTAL PROTEIN: 7 g/dL (ref 6.0–8.5)

## 2016-02-05 LAB — LIPID PANEL
Chol/HDL Ratio: 3.9 ratio units (ref 0.0–4.4)
Cholesterol, Total: 217 mg/dL — ABNORMAL HIGH (ref 100–199)
HDL: 56 mg/dL (ref >39)
LDL Calculated: 138 mg/dL — ABNORMAL HIGH (ref 0–99)
TRIGLYCERIDES: 117 mg/dL (ref 0–149)
VLDL CHOLESTEROL CAL: 23 mg/dL (ref 5–40)

## 2016-02-05 MED ORDER — MEDROXYPROGESTERONE ACETATE 10 MG PO TABS: 10.0000 mg | ORAL_TABLET | Freq: Every day | ORAL | 0 refills | Status: DC

## 2016-02-05 NOTE — Telephone Encounter (Signed)
[facility] 10[facility]  [facility] not [facility]. Rx sent to correct pharmacy.

## 2016-04-04 IMAGING — MR MR HEAD W/O CM
6 of 10 series · 26 of 48 positions shown · non-contrast
Comparison: 09/09/2014 CT.  No comparison MR.

CLINICAL DATA: 53-year-old hypertensive female with history of
hyperlipidemia presenting with episodes of passing out occurring
over the past 10 years, worse in the past 2 weeks. No known injury.
Subsequent encounter.

EXAM:
MRI HEAD WITHOUT CONTRAST
TECHNIQUE: Multiplanar, multiecho pulse sequences of the brain and surrounding
structures were obtained without intravenous contrast.

[Series 2: [id number] · sagittal · 5.0mm · 0.42mm/px · 3 of 20 slices shown]
[im 1/20]
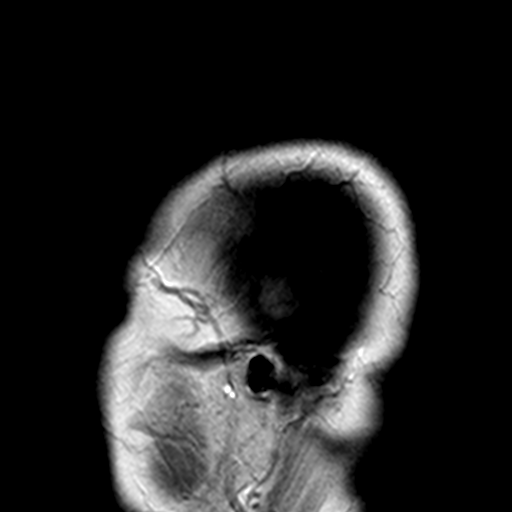
[im 10/20]
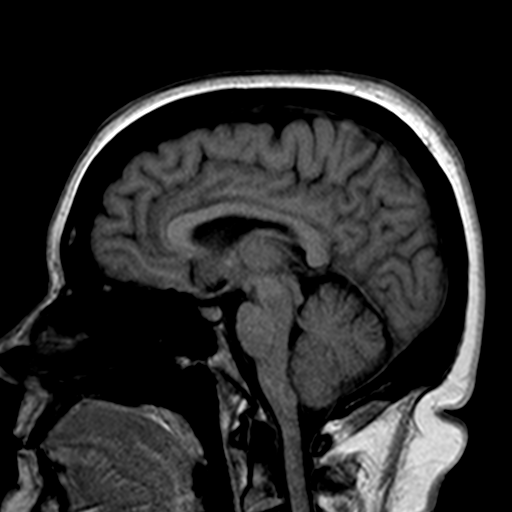
[im 20/20]
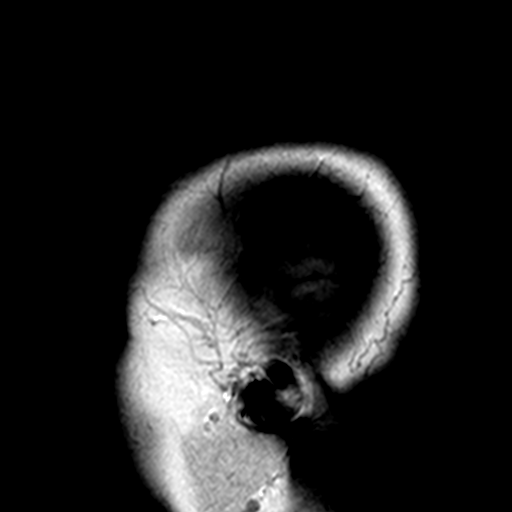

[Series 5: T2 · axial · 5.0mm · 0.51mm/px · z∈[-70,+73]mm · 3 of 23 slices shown (1 of 2)]
[im 1/23]
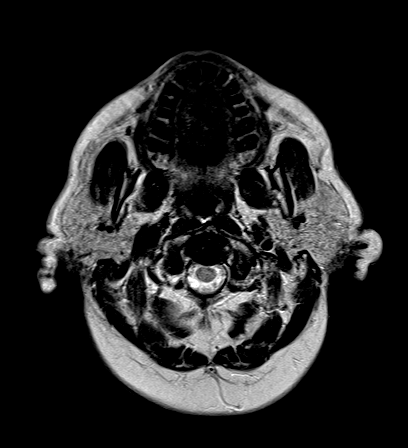
[im 12/23]
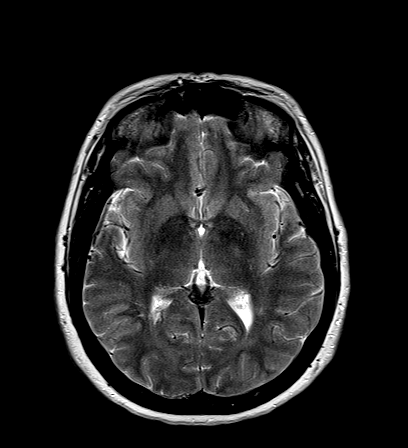
[im 23/23]
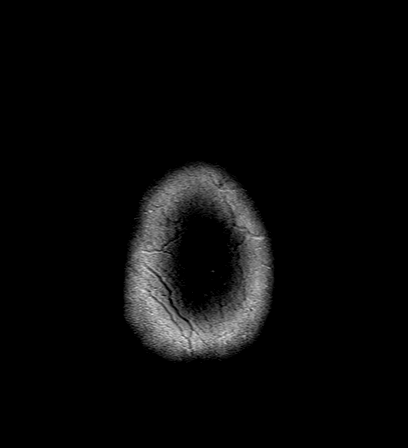

[Series 6: FLAIR · axial · 5.0mm · 0.94mm/px · z∈[-69,+74]mm · 3 of 23 slices shown]
[im 1/23]
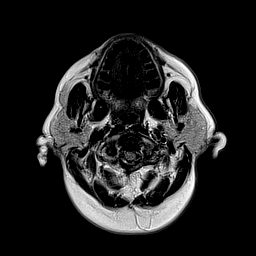
[im 12/23]
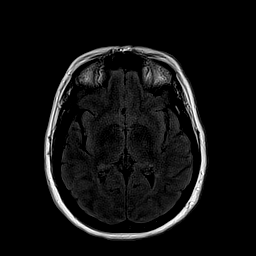
[im 23/23]
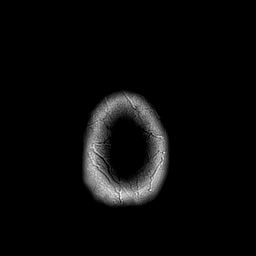

[Series 7: T1 · axial · 2.0mm · 0.41mm/px · z∈[-80,+103]mm · 11 of 93 slices shown]
[im 1/93]
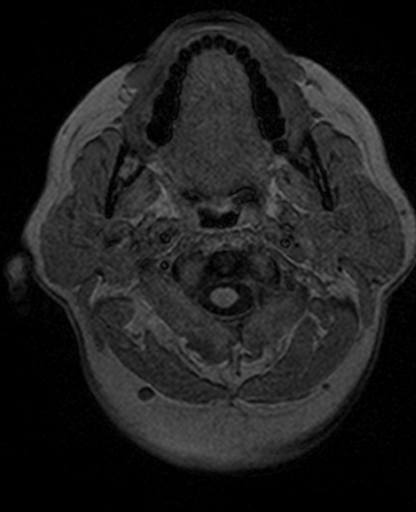
[im 10/93]
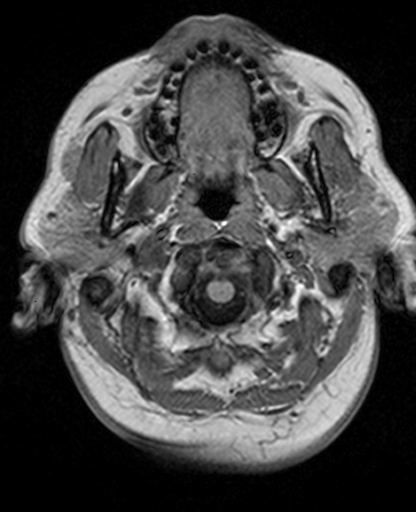
[im 19/93]
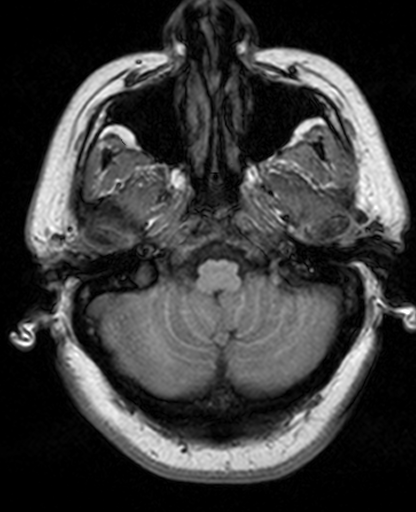
[im 28/93]
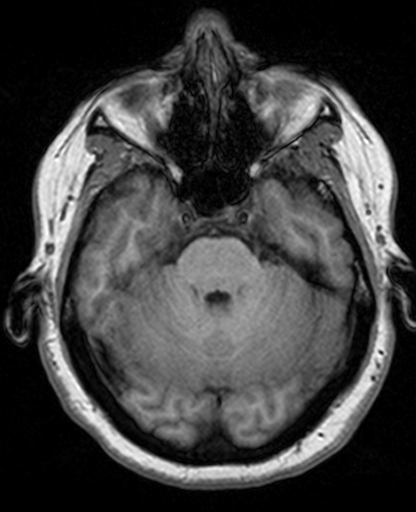
[im 37/93]
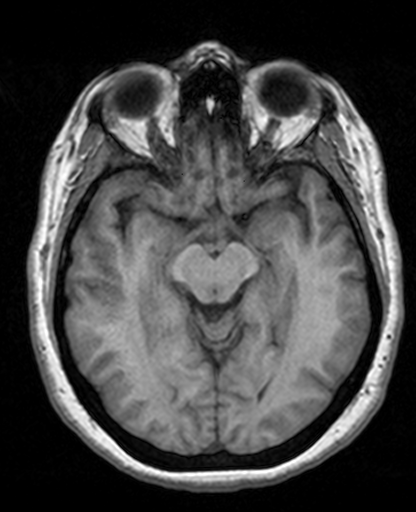
[im 47/93]
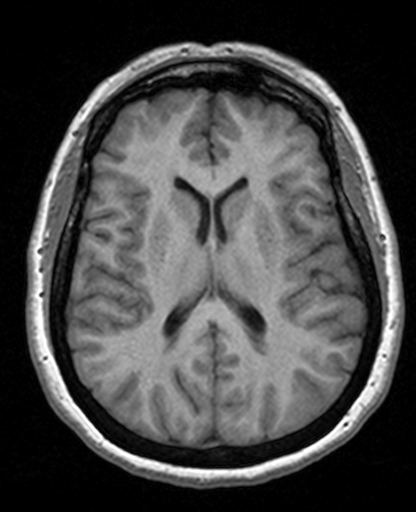
[im 56/93]
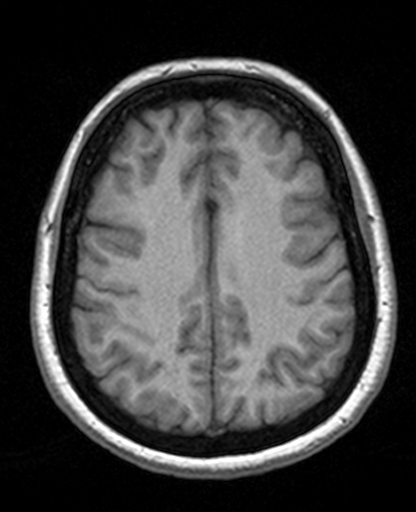
[im 65/93]
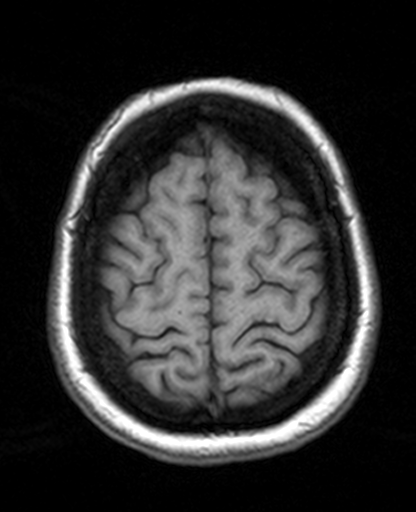
[im 74/93]
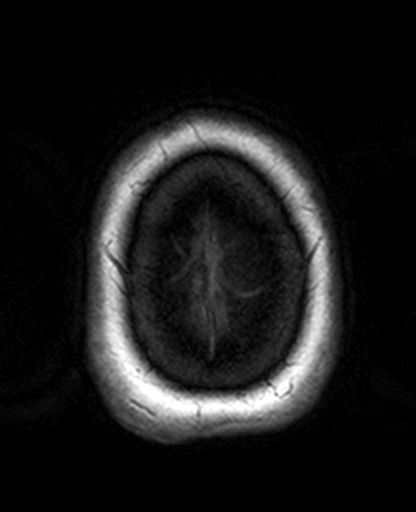
[im 83/93]
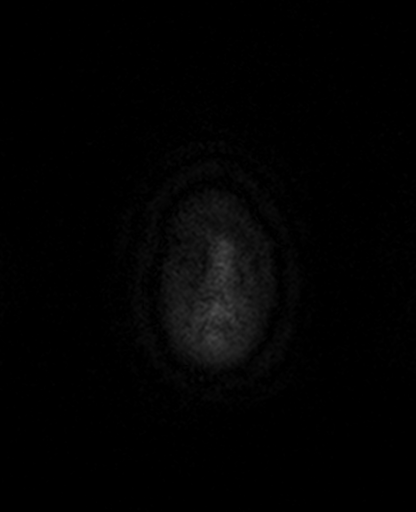
[im 93/93]
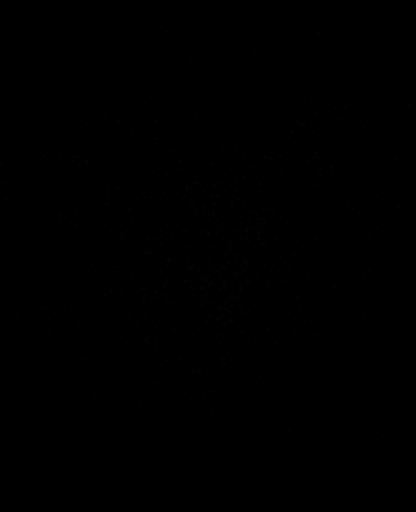

[Series 8: trauma axial · axial · 5.0mm · 0.45mm/px · z∈[-63,+67]mm · 3 of 21 slices shown]
[im 1/21]
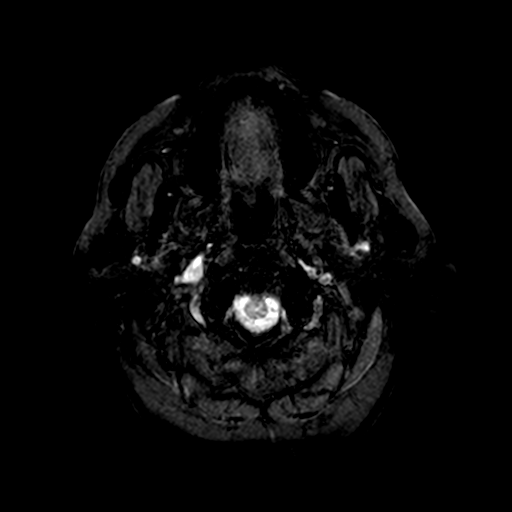
[im 11/21]
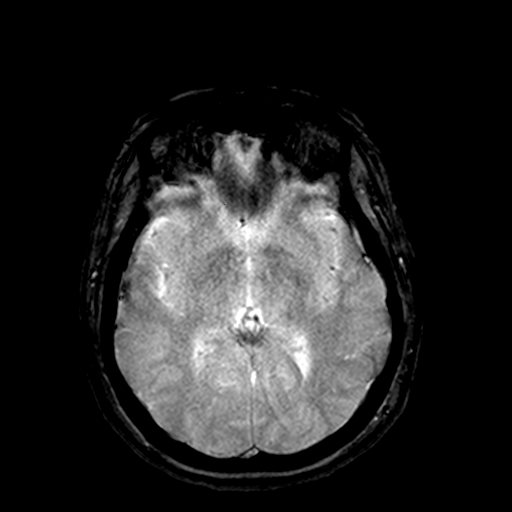
[im 21/21]
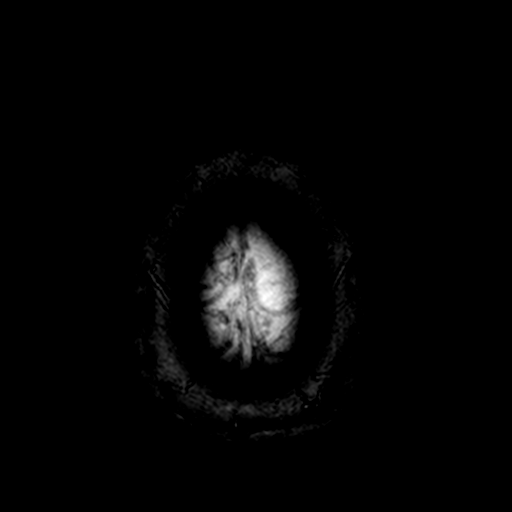

[Series 9: T2 · coronal · 5.0mm · 0.60mm/px · 3 of 26 slices shown (2 of 2)]
[im 1/26]
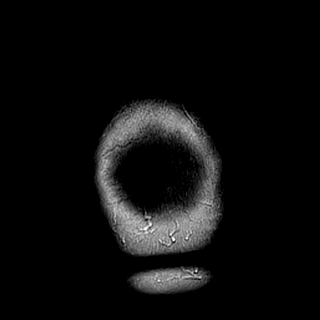
[im 13/26]
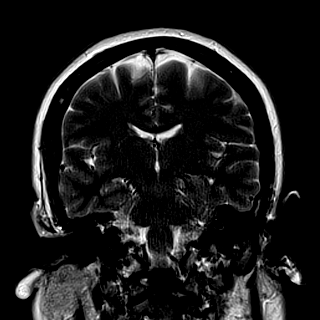
[im 26/26]
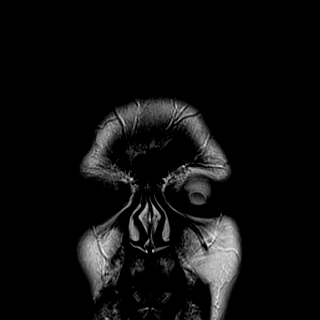

[26 of 48 positions shown; findings below may reference images not displayed]

FINDINGS: Exam is motion degraded.

No hydrocephalus.

No intracranial mass lesion noted on this unenhanced exam.

Very small left frontal white matter hyperintensity probably related
to result of small vessel disease in this hypertensive obese
patient.

No acute infarct.

No intracranial hemorrhage.

Major intracranial vascular structures are patent.

Dens and transverse ligament cause slight narrowing of the ventral
aspect of the thecal sac at the C1 level. Remainder of cervical
medullary junction is unremarkable.

Prominent calcification pineal region without compression of the
superior colliculus.

Pituitary region unremarkable.

Orbital structures appear intact.
IMPRESSION: Minimal white matter type changes left frontal lobe may be related
to result of small vessel disease in this hypertensive diabetic
hyperlipidemic patient.

Dens and transverse ligament cause slight narrowing of the ventral
aspect of the thecal sac at the C1 level. This appearance may be
normal for this patient as transverse ligament does not appear
hypertrophied and no evidence of cranial settling.

## 2016-05-07 ENCOUNTER — Ambulatory Visit (INDEPENDENT_AMBULATORY_CARE_PROVIDER_SITE_OTHER): Admitting: Nurse Practitioner

## 2016-05-07 ENCOUNTER — Encounter: Payer: Self-pay | Admitting: Nurse Practitioner

## 2016-05-07 VITALS — BP 136/98 | HR 92 | Temp 96.8°F | Ht 61.0 in

## 2016-05-07 DIAGNOSIS — G2581 Restless legs syndrome: Secondary | ICD-10-CM

## 2016-05-07 DIAGNOSIS — F3342 Major depressive disorder, recurrent, in full remission: Secondary | ICD-10-CM

## 2016-05-07 DIAGNOSIS — I10 Essential (primary) hypertension: Secondary | ICD-10-CM | POA: Diagnosis not present

## 2016-05-07 DIAGNOSIS — M797 Fibromyalgia: Secondary | ICD-10-CM

## 2016-05-07 DIAGNOSIS — F5101 Primary insomnia: Secondary | ICD-10-CM

## 2016-05-07 DIAGNOSIS — E782 Mixed hyperlipidemia: Secondary | ICD-10-CM | POA: Diagnosis not present

## 2016-05-07 DIAGNOSIS — K219 Gastro-esophageal reflux disease without esophagitis: Secondary | ICD-10-CM | POA: Diagnosis not present

## 2016-05-07 DIAGNOSIS — J4520 Mild intermittent asthma, uncomplicated: Secondary | ICD-10-CM

## 2016-05-07 DIAGNOSIS — G43009 Migraine without aura, not intractable, without status migrainosus: Secondary | ICD-10-CM | POA: Diagnosis not present

## 2016-05-07 MED ORDER — TRAMADOL HCL 50 MG PO TABS: 50.0000 mg | ORAL_TABLET | Freq: Three times a day (TID) | ORAL | 1 refills | Status: DC | PRN

## 2016-05-07 MED ORDER — BACLOFEN 10 MG PO TABS: 10.0000 mg | ORAL_TABLET | Freq: Three times a day (TID) | ORAL | 5 refills | Status: DC

## 2016-05-07 MED ORDER — ZOLPIDEM TARTRATE 10 MG PO TABS: 10.0000 mg | ORAL_TABLET | Freq: Every day | ORAL | 2 refills | Status: DC

## 2016-05-07 MED ORDER — TRAMADOL HCL 50 MG PO TABS
50.0000 mg | ORAL_TABLET | Freq: Three times a day (TID) | ORAL | 0 refills | Status: DC | PRN
Start: 2016-05-07 — End: 2016-08-07

## 2016-05-07 MED ORDER — TRAMADOL HCL 50 MG PO TABS: 50.0000 mg | ORAL_TABLET | Freq: Three times a day (TID) | ORAL | 0 refills | Status: DC | PRN

## 2016-05-07 NOTE — Progress Notes (Signed)
Subjective:    Patient ID: Marie Carter, female    DOB: [birth year], 55 y.o.   MRN: [id number]   Patient here today for follow up of chronic medical problems.   Outpatient Encounter Prescriptions as of 02/04/2016  Medication Sig  . atorvastatin (LIPITOR) 40 MG tablet Take 1 tablet (40 mg total) by mouth daily.  . baclofen (LIORESAL) 10 MG tablet Take 1 tablet (10 mg total) by mouth 3 (three) times daily.  . DULoxetine (CYMBALTA) 60 MG capsule TAKE 1 CAPSULE DAILY  . eletriptan (RELPAX) 20 MG tablet Take 2 tablets (40 mg total) by mouth as needed. may repeat in 2 hours if necessary  . EPINEPHrine 0.3 mg/0.3 mL IJ SOAJ injection Inject 0.3 mLs (0.3 mg total) into the muscle once.  . fesoterodine (TOVIAZ) 8 MG TB24 tablet Take 1 tablet (8 mg total) by mouth daily.  [name] gabapentin (NEURONTIN) 300 MG capsule Take 1 capsule (300 mg total) by mouth 3 (three) times daily.  [name] lisinopril-hydrochlorothiazide (ZESTORETIC) 20-12.5 MG tablet Take 2 tablets by mouth daily.  [name] omeprazole (PRILOSEC) 40 MG capsule TAKE 1 CAPSULE DAILY  . pramipexole (MIRAPEX) 1 MG tablet Take 1 tablet (1 mg total) by mouth 2 (two) times daily.  [name] PROAIR HFA 108 (90 Base) MCG/ACT inhaler USE 2 INHALATIONS EVERY 6 HOURS AS NEEDED FOR WHEEZING  . topiramate (TOPAMAX) 50 MG tablet Take 1 tablet (50 mg total) by mouth 3 (three) times daily.  . traMADol (ULTRAM) 50 MG tablet Take 1 tablet (50 mg total) by mouth 3 (three) times daily as needed.  . zolpidem (AMBIEN) 10 MG tablet Take 1 tablet (10 mg total) by mouth at bedtime.   * Patient has not had a period in over 2 years- started periord 10 days ago nad it is brown and foul smelling with lots of cramping.  Marland KitchenHypertension  This is a chronic problem. The current episode started more than 1 year ago. The problem is unchanged. The problem is uncontrolled. Risk factors for coronary artery disease include dyslipidemia, obesity, post-menopausal state and sedentary lifestyle. Past  treatments include ACE inhibitors and diuretics (changed meds at last viist but [facility] still has not sent new med dose- did not take meds today.). The current treatment provides moderate improvement. Compliance problems include diet and exercise.  Hypertensive end-organ damage includes CVA. There is no history of CAD/MI.  Hyperlipidemia  This is a chronic problem. The current episode started more than 1 year ago. Recent lipid tests were reviewed and are variable. Exacerbating diseases include obesity. She has no history of diabetes or hypothyroidism. Pertinent negatives include no myalgias. Current antihyperlipidemic treatment includes statins. The current treatment provides moderate improvement of lipids. Compliance problems include adherence to diet and adherence to exercise.  Risk factors for coronary artery disease include dyslipidemia, hypertension, obesity and post-menopausal.  fibromyalgia Currently on cymbalta and muscle relaxor- doing okay always has some pain but continues to work through it- uses and occasional ultram. Pain assessment: Cause of pain- Fibromyalgia Pain location- worse in shoulder areas Pain on scale of 1-10- 7/10 Frequency- daily What increases pain- doing repetitive work What makes pain Better- resting helps Effects on ADL - some days cannot do anything Any change in general medical condition--- none  Current medications- ultram Effectiveness of current meds-helps ease pain but des not completely take away Adverse reactions form pain meds-- none Morphine equivalent 15.0  Pill count performed-No Urine drug screen- No Was the [place] reviewed- yes  If yes were  their any concerning findings? - no suspicious activity  migraine Has cut down on caffeine and that has really helped  Her frequency of migraines- still taking topamax and occasional relpax. CVA history/left sided weakness Doing well still walks with a cane RLS mirapex working well GERD Currently  on omeprazole daily which works well to keep symptoms under control Insomnia ambien nightly- cannot sleep without taking depression cymbalta helping- no crying daily like she use to urinary urgency [name] helps urgency- no incontinence     Review of Systems  Constitutional: Negative.   HENT: Negative.   Respiratory: Negative.   Cardiovascular: Negative.   Gastrointestinal: Negative.   Genitourinary: Negative.   Musculoskeletal: Negative for myalgias.  Neurological: Negative.   Psychiatric/Behavioral: Negative.   All other systems reviewed and are negative.      Objective:   Physical Exam  Constitutional: She is oriented to person, place, and time. She appears well-developed and well-nourished.  HENT:  Nose: Nose normal.  Mouth/Throat: Oropharynx is clear and moist.  Eyes: EOM are normal.  Neck: Trachea normal, normal range of motion and full passive range of motion without pain. Neck supple. No JVD present. Carotid bruit is not present. No thyromegaly present.  Cardiovascular: Normal rate, regular rhythm, normal heart sounds and intact distal pulses.  Exam reveals no gallop and no friction rub.   No murmur heard. Pulmonary/Chest: Effort normal and breath sounds normal.  Abdominal: Soft. Bowel sounds are normal. She exhibits no distension and no mass. There is no tenderness.  Musculoskeletal: Normal range of motion. She exhibits edema (1+edema bil lowe rext.).  Multiple tender points up and down back  Lymphadenopathy:    She has no cervical adenopathy.  Neurological: She is alert and oriented to person, place, and time. She has normal reflexes.  Skin: Skin is warm and dry.  Psychiatric: She has a normal mood and affect. Her behavior is normal. Judgment and thought content normal.   LMP 09/13/2012        Assessment & Plan:  1. Essential hypertension Do not add salt to diet - lisinopril-hydrochlorothiazide (ZESTORETIC) 20-12.5 MG tablet; Take 2 tablets by mouth  daily.  Dispense: 180 tablet; Refill: 1 - CMP14+EGFR  2. Migraine without aura and without status migrainosus, not intractable Avoid caffeine - topiramate (TOPAMAX) 50 MG tablet; Take 1 tablet (50 mg total) by mouth 3 (three) times daily.  Dispense: 270 tablet; Refill: 1  3. Asthma, chronic, mild intermittent, uncomplicated  4. Gastroesophageal reflux disease without esophagitis Avoid spicy foods Do not eat 2 hours prior to bedtime - omeprazole (PRILOSEC) 40 MG capsule; Take 1 capsule (40 mg total) by mouth daily.  Dispense: 90 capsule; Refill: 1  5. Fibromyalgia Exercise when can to keep muscles warm - gabapentin (NEURONTIN) 300 MG capsule; Take 1 capsule (300 mg total) by mouth 3 (three) times daily.  Dispense: 540 capsule; Refill: 1 - DULoxetine (CYMBALTA) 60 MG capsule; Take 1 capsule (60 mg total) by mouth daily.  Dispense: 90 capsule; Refill: 1 - traMADol (ULTRAM) 50 MG tablet; Take 1 tablet (50 mg total) by mouth 3 (three) times daily as needed.  Dispense: 90 tablet; Refill: 1  6. Depression stressmanagement  7. Hyperlipidemia Low fat diet - atorvastatin (LIPITOR) 40 MG tablet; Take 1 tablet (40 mg total) by mouth daily.  Dispense: 90 tablet; Refill: 1 - Lipid panel  8. Insomnia Bedtime routine - zolpidem (AMBIEN) 10 MG tablet; Take 1 tablet (10 mg total) by mouth at bedtime.  Dispense: 90 tablet;  Refill: 1  9. RLS (restless legs syndrome) Keep legs warm t night - pramipexole (MIRAPEX) 1 MG tablet; Take 1 tablet (1 mg total) by mouth 2 (two) times daily.  Dispense: 180 tablet; Refill: 1  10. Morbid obesity, unspecified obesity type ([place]) Discussed diet and exercise for person with BMI >25 Will recheck weight in 3-6 months  11. Urinary urgency - fesoterodine (TOVIAZ) 8 MG TB24 tablet; Take 1 tablet (8 mg total) by mouth daily.  Dispense: 90 tablet; Refill: 1  12. Post menopausal bleeding Provera 40m 1 po qd for 10 days will need to schedule endometrial bx when  bleeding stops    Labs pending Health maintenance reviewed Diet and exercise encouraged Continue all meds Follow up  In 3 month   [place] FNP

## 2016-05-07 NOTE — Addendum Note (Signed)
Addended by: Bennie PieriniMARTIN, MARY-MARGARET on: 05/07/2016 11:08 AM   Modules accepted: Orders

## 2016-05-07 NOTE — Patient Instructions (Signed)
Myofascial Pain Syndrome and Fibromyalgia Introduction Myofascial pain syndrome and fibromyalgia are both pain disorders. This pain may be felt mainly in your muscles.  Myofascial pain syndrome:  Always has trigger points or tender points in the muscle that will cause pain when pressed. The pain may come and go.  Usually affects your neck, upper back, and shoulder areas. The pain often radiates into your arms and hands.  Fibromyalgia:  Has muscle pains and tenderness that come and go.  Is often associated with fatigue and sleep disturbances.  Has trigger points.  Tends to be long-lasting (chronic), but is not life-threatening. Fibromyalgia and myofascial pain are not the same. However, they often occur together. If you have both conditions, each can make the other worse. Both are common and can cause enough pain and fatigue to make day-to-day activities difficult. What are the causes? The exact causes of fibromyalgia and myofascial pain are not known. People with certain gene types may be more likely to develop fibromyalgia. Some factors can be triggers for both conditions, such as:  Spine disorders.  Arthritis.  Severe injury (trauma) and other physical stressors.  Being under a lot of stress.  A medical illness. What are the signs or symptoms? Fibromyalgia  The main symptom of fibromyalgia is widespread pain and tenderness in your muscles. This can vary over time. Pain is sometimes described as stabbing, shooting, or burning. You may have tingling or numbness, too. You may also have sleep problems and fatigue. You may wake up feeling tired and groggy (fibro fog). Other symptoms may include:  Bowel and bladder problems.  Headaches.  Visual problems.  Problems with odors and noises.  Depression or mood changes.  Painful menstrual periods (dysmenorrhea).  Dry skin or eyes. Myofascial pain syndrome  Symptoms of myofascial pain syndrome include:  Tight, ropy bands of  muscle.  Uncomfortable sensations in muscular areas, such as:  Aching.  Cramping.  Burning.  Numbness.  Tingling.  Muscle weakness.  Trouble moving certain muscles freely (range of motion). How is this diagnosed? There are no specific tests to diagnose fibromyalgia or myofascial pain syndrome. Both can be hard to diagnose because their symptoms are common in many other conditions. Your health care provider may suspect one or both of these conditions based on your symptoms and medical history. Your health care provider will also do a physical exam. The key to diagnosing fibromyalgia is having pain, fatigue, and other symptoms for more than three months that cannot be explained by another condition. The key to diagnosing myofascial pain syndrome is finding trigger points in muscles that are tender and cause pain elsewhere in your body (referred pain). How is this treated? Treating fibromyalgia and myofascial pain often requires a team of health care providers. This usually starts with your primary provider and a physical therapist. You may also find it helpful to work with alternative health care providers, such as massage therapists or acupuncturists. Treatment for fibromyalgia may include medicines. This may include nonsteroidal anti-inflammatory drugs (NSAIDs), along with other medicines. Treatment for myofascial pain may also include:  NSAIDs.  Cooling and stretching of muscles.  Trigger point injections.  Sound wave (ultrasound) treatments to stimulate muscles. Follow these instructions at home:  Take medicines only as directed by your health care provider.  Exercise as directed by your health care provider or physical therapist.  Try to avoid stressful situations.  Practice relaxation techniques to control your stress. You may want to try:  Biofeedback.  Visual imagery.    Hypnosis.  Muscle relaxation.  Yoga.  Meditation.  Talk to your health care provider  about alternative treatments, such as acupuncture or massage treatment.  Maintain a healthy lifestyle. This includes eating a healthy diet and getting enough sleep.  Consider joining a support group.  Do not do activities that stress or strain your muscles. That includes repetitive motions and heavy lifting. Where to find more information:  National Fibromyalgia Association: www.fmaware.org  Arthritis Foundation: www.arthritis.org  American Chronic Pain Association: www.theacpa.org/condition/myofascial-pain Contact a health care provider if:  You have new symptoms.  Your symptoms get worse.  You have side effects from your medicines.  You have trouble sleeping.  Your condition is causing depression or anxiety. This information is not intended to replace advice given to you by your health care provider. Make sure you discuss any questions you have with your health care provider. Document Released: 05/18/2005 Document Revised: 10/24/2015 Document Reviewed: 02/21/2014  2017 Elsevier  

## 2016-05-07 NOTE — Progress Notes (Signed)
Subjective:    Patient ID: Marie Carter, female    DOB: September 01, 1960, 55 y.o.   MRN: [id number]   Patient here today for follow up of chronic medical problems.   Outpatient Encounter Prescriptions as of 02/04/2016  Medication Sig  . atorvastatin (LIPITOR) 40 MG tablet Take 1 tablet (40 mg total) by mouth daily.  . baclofen (LIORESAL) 10 MG tablet Take 1 tablet (10 mg total) by mouth 3 (three) times daily.  . DULoxetine (CYMBALTA) 60 MG capsule TAKE 1 CAPSULE DAILY  . eletriptan (RELPAX) 20 MG tablet Take 2 tablets (40 mg total) by mouth as needed. may repeat in 2 hours if necessary  . EPINEPHrine 0.3 mg/0.3 mL IJ SOAJ injection Inject 0.3 mLs (0.3 mg total) into the muscle once.  . fesoterodine (TOVIAZ) 8 MG TB24 tablet Take 1 tablet (8 mg total) by mouth daily.  [name] gabapentin (NEURONTIN) 300 MG capsule Take 1 capsule (300 mg total) by mouth 3 (three) times daily.  [name] lisinopril-hydrochlorothiazide (ZESTORETIC) 20-12.5 MG tablet Take 2 tablets by mouth daily.  [name] omeprazole (PRILOSEC) 40 MG capsule TAKE 1 CAPSULE DAILY  . pramipexole (MIRAPEX) 1 MG tablet Take 1 tablet (1 mg total) by mouth 2 (two) times daily.  [name] PROAIR HFA 108 (90 Base) MCG/ACT inhaler USE 2 INHALATIONS EVERY 6 HOURS AS NEEDED FOR WHEEZING  . topiramate (TOPAMAX) 50 MG tablet Take 1 tablet (50 mg total) by mouth 3 (three) times daily.  . traMADol (ULTRAM) 50 MG tablet Take 1 tablet (50 mg total) by mouth 3 (three) times daily as needed.  . zolpidem (AMBIEN) 10 MG tablet Take 1 tablet (10 mg total) by mouth at bedtime.    * Last visit c/o post menpausal bleeding -was given provera for 10 days- has stopped and has had no more episodes- needs to have a endometrial bx and PAP.   Marland KitchenHypertension  This is a chronic problem. The current episode started more than 1 year ago. The problem is unchanged. The problem is uncontrolled. Risk factors for coronary artery disease include dyslipidemia, obesity, post-menopausal state and  sedentary lifestyle. Past treatments include ACE inhibitors and diuretics (changed meds at last [facility]ist but [facility] still has not sent new med dose- did not take meds today.). The current treatment provides moderate improvement. Compliance problems include diet and exercise.  Hypertensive end-organ damage includes CVA. There is no history of CAD/MI.  Hyperlipidemia  This is a chronic problem. The current episode started more than 1 year ago. Recent lipid tests were reviewed and are variable. Exacerbating diseases include obesity. She has no history of diabetes or hypothyroidism. Pertinent negatives include no myalgias. Current antihyperlipidemic treatment includes statins. The current treatment provides moderate improvement of lipids. Compliance problems include adherence to diet and adherence to exercise.  Risk factors for coronary artery disease include dyslipidemia, hypertension, obesity and post-menopausal.  fibromyalgia Currently on cymbalta and muscle relaxor- doing okay always has some pain but continues to work through it- uses and occasional ultram. Pain assessment: Cause of pain- Fibromyalgia Pain location- worse in shoulder areas Pain on scale of 1-10- 7/10 Frequency- daily What increases pain- doing repetitive work What makes pain Better- resting helps Effects on ADL - some days cannot do anything Any change in general medical condition--- none  Current medications- ultram Effectiveness of current meds-helps ease pain but des not completely take away Adverse reactions form pain meds-- none Morphine equivalent 15.0  Pill count performed-No Urine drug screen- No Was the [facility] reviewed- yes  If yes were their any concerning findings? - no suspicious activity  migraine Has cut down on caffeine and that has really helped  Her frequency of migraines- still taking topamax and occasional relpax. CVA history/left sided weakness Doing well still walks with a cane RLS mirapex  working well GERD Currently on omeprazole daily which works well to keep symptoms under control Insomnia ambien nightly- cannot sleep without taking depression cymbalta helping- no crying daily like she use to urinary urgency [name] helps urgency- no incontinence     Review of Systems  Constitutional: Negative.   HENT: Negative.   Respiratory: Negative.   Cardiovascular: Negative.   Gastrointestinal: Negative.   Genitourinary: Negative.   Musculoskeletal: Negative for myalgias.  Neurological: Negative.   Psychiatric/Behavioral: Negative.   All other systems reviewed and are negative.      Objective:   Physical Exam  Constitutional: She is oriented to person, place, and time. She appears well-developed and well-nourished.  HENT:  Nose: Nose normal.  Mouth/Throat: Oropharynx is clear and moist.  Eyes: EOM are normal.  Neck: Trachea normal, normal range of motion and full passive range of motion without pain. Neck supple. No JVD present. Carotid bruit is not present. No thyromegaly present.  Cardiovascular: Normal rate, regular rhythm, normal heart sounds and intact distal pulses.  Exam reveals no gallop and no friction rub.   No murmur heard. Pulmonary/Chest: Effort normal and breath sounds normal.  Abdominal: Soft. Bowel sounds are normal. She exhibits no distension and no mass. There is no tenderness.  Musculoskeletal: Normal range of motion. She exhibits edema (1+edema bil lowe rext.).  Multiple tender points up and down back  Lymphadenopathy:    She has no cervical adenopathy.  Neurological: She is alert and oriented to person, place, and time. She has normal reflexes.  Skin: Skin is warm and dry.  Psychiatric: She has a normal mood and affect. Her behavior is normal. Judgment and thought content normal.   LMP 09/13/2012   BP (!) 136/98   Pulse 92   Temp (!) 96.8 F (36 C) (Oral)   Ht 5' 1"  (1.549 m) Comment: Refused  LMP 09/13/2012       Assessment & Plan:   1. Essential hypertension Low sodium diet - CMP14+EGFR  2. Migraine without aura and without status migrainosus, not intractable  3. Mild intermittent chronic asthma without complication  4. Gastroesophageal reflux disease without esophagitis Avoid spicy foods Do not eat 2 hours prior to bedtime  5. Recurrent major depressive disorder, in full remission [facility]) Stress management  6. Fibromyalgia exercise - traMADol (ULTRAM) 50 MG tablet; Take 1 tablet (50 mg total) by mouth 3 (three) times daily as needed.  Dispense: 90 tablet; Refill: 1 - traMADol (ULTRAM) 50 MG tablet; Take 1 tablet (50 mg total) by mouth every 8 (eight) hours as needed.  Dispense: 30 tablet; Refill: 0 - traMADol (ULTRAM) 50 MG tablet; Take 1 tablet (50 mg total) by mouth every 8 (eight) hours as needed.  Dispense: 30 tablet; Refill: 0 - baclofen (LIORESAL) 10 MG tablet; Take 1 tablet (10 mg total) by mouth 3 (three) times daily.  Dispense: 90 each; Refill: 5  7. Mixed hyperlipidemia Low fat diet - Lipid panel  8. Primary insomnia Bedtime routine  9. RLS (restless legs syndrome) Keep legs warm at night  10. Severe obesity (BMI >= 40) (HCC) Discussed diet and exercise for person with BMI >25 Will recheck weight in 3-6 months    Labs pending Health maintenance  reviewed Diet and exercise encouraged Continue all meds Follow up  In 3 months   [place], FNP

## 2016-05-08 LAB — CMP14+EGFR
A/G RATIO: 1.4 (ref 1.2–2.2)
ALBUMIN: 4.4 g/dL (ref 3.5–5.5)
ALT: 29 IU/L (ref 0–32)
AST: 20 IU/L (ref 0–40)
Alkaline Phosphatase: 128 IU/L — ABNORMAL HIGH (ref 39–117)
BILIRUBIN TOTAL: 0.4 mg/dL (ref 0.0–1.2)
BUN / CREAT RATIO: 17 (ref 9–23)
BUN: 18 mg/dL (ref 6–24)
CHLORIDE: 98 mmol/L (ref 96–106)
CO2: 26 mmol/L (ref 18–29)
Calcium: 9.7 mg/dL (ref 8.7–10.2)
Creatinine, Ser: 1.04 mg/dL — ABNORMAL HIGH (ref 0.57–1.00)
GFR calc non Af Amer: 60 mL/min/{1.73_m2} (ref >59)
GFR, EST AFRICAN AMERICAN: 69 mL/min/{1.73_m2} (ref >59)
GLOBULIN, TOTAL: 3.2 g/dL (ref 1.5–4.5)
Glucose: 84 mg/dL (ref 65–99)
POTASSIUM: 4.2 mmol/L (ref 3.5–5.2)
Sodium: 141 mmol/L (ref 134–144)
TOTAL PROTEIN: 7.6 g/dL (ref 6.0–8.5)

## 2016-05-08 LAB — LIPID PANEL
Chol/HDL Ratio: 3.6 ratio units (ref 0.0–4.4)
Cholesterol, Total: 214 mg/dL — ABNORMAL HIGH (ref 100–199)
HDL: 60 mg/dL (ref >39)
LDL Calculated: 134 mg/dL — ABNORMAL HIGH (ref 0–99)
Triglycerides: 100 mg/dL (ref 0–149)
VLDL CHOLESTEROL CAL: 20 mg/dL (ref 5–40)

## 2016-05-08 LAB — SPECIMEN STATUS

## 2016-07-09 ENCOUNTER — Telehealth: Payer: Self-pay | Admitting: Nurse Practitioner

## 2016-07-10 ENCOUNTER — Other Ambulatory Visit: Payer: Self-pay | Admitting: Nurse Practitioner

## 2016-07-10 DIAGNOSIS — M797 Fibromyalgia: Secondary | ICD-10-CM

## 2016-07-10 MED ORDER — BACLOFEN 10 MG PO TABS: 10.0000 mg | ORAL_TABLET | Freq: Three times a day (TID) | ORAL | 0 refills | Status: DC

## 2016-07-10 MED ORDER — BACLOFEN 10 MG PO TABS: 10.0000 mg | ORAL_TABLET | Freq: Three times a day (TID) | ORAL | 5 refills | Status: DC

## 2016-07-29 ENCOUNTER — Ambulatory Visit (INDEPENDENT_AMBULATORY_CARE_PROVIDER_SITE_OTHER): Admitting: Family Medicine

## 2016-07-29 ENCOUNTER — Encounter: Payer: Self-pay | Admitting: Family Medicine

## 2016-07-29 VITALS — BP 133/87 | HR 82 | Temp 98.7°F | Ht 61.0 in | Wt 247.6 lb

## 2016-07-29 DIAGNOSIS — J209 Acute bronchitis, unspecified: Secondary | ICD-10-CM | POA: Diagnosis not present

## 2016-07-29 MED ORDER — DOXYCYCLINE HYCLATE 100 MG PO TABS: 100.0000 mg | ORAL_TABLET | Freq: Two times a day (BID) | ORAL | 0 refills | Status: DC

## 2016-07-29 NOTE — Progress Notes (Signed)
BP 133/87   Pulse 82   Temp 98.7 F (37.1 C) (Oral)   Ht 5\' 1"  (1.549 m)   Wt 247 lb 9 oz (112.3 kg)   LMP 09/13/2012   BMI 46.78 kg/m    Subjective:    Patient ID: Marie Carter, female    DOB: 1960-12-15, 56 y.o.   MRN: [id number]  HPI: Marie Carter is a 56 y.o. female presenting on 07/29/2016 for URI (x 2 weeks, fever yesterday, cough, chest congestion, burning in chest, sinus drainage, headache; has tried Mucinex but she broke out in a rash)   HPI Cough and congestion Patient is coming in with cough and congestion and chest congestion and fever that has been going on for 2 weeks. Her fever is been low-grade and in the 99. Today in the office is 98.7. She has been using Tylenol and Flonase and nasal saline sprays and an antihistamine without much success. She denies any shortness of breath but does think she is having some wheezing and coughing spells especially overnight last night that kept her from sleeping. Her cough has been productive of yellow sputum. She couldn't do Mucinex because in the past she's tried it and had a rash because of it.  Relevant past medical, surgical, family and social history reviewed and updated as indicated. Interim medical history since our last visit reviewed. Allergies and medications reviewed and updated.  Review of Systems  Constitutional: Positive for fever. Negative for chills.  HENT: Positive for congestion, ear pain, postnasal drip, rhinorrhea, sinus pressure, sneezing and sore throat. Negative for ear discharge.   Eyes: Negative for pain, redness and visual disturbance.  Respiratory: Positive for cough and wheezing. Negative for chest tightness and shortness of breath.   Cardiovascular: Negative for chest pain and leg swelling.  Genitourinary: Negative for difficulty urinating and dysuria.  Musculoskeletal: Negative for back pain and gait problem.  Skin: Negative for rash.  Neurological: Negative for light-headedness and headaches.    Psychiatric/Behavioral: Negative for agitation and behavioral problems.  All other systems reviewed and are negative.   Per HPI unless specifically indicated above   Allergies as of 07/29/2016      Reactions   Bee Pollen    Butrans [buprenorphine] Nausea And Vomiting   patch   Coconut Oil    Mucinex [guaifenesin Er] Rash      Medication List       Accurate as of 07/29/16  8:47 AM. Always use your most recent med list.          atorvastatin 40 MG tablet Commonly known as:  LIPITOR Take 1 tablet (40 mg total) by mouth daily.   baclofen 10 MG tablet Commonly known as:  LIORESAL Take 1 tablet (10 mg total) by mouth 3 (three) times daily.   baclofen 10 MG tablet Commonly known as:  LIORESAL Take 1 tablet (10 mg total) by mouth 3 (three) times daily.   doxycycline 100 MG tablet Commonly known as:  VIBRA-TABS Take 1 tablet (100 mg total) by mouth 2 (two) times daily. 1 po bid   DULoxetine 60 MG capsule Commonly known as:  CYMBALTA Take 1 capsule (60 mg total) by mouth daily.   EPINEPHrine 0.3 mg/0.3 mL Soaj injection Commonly known as:  EPI-PEN Inject 0.3 mLs (0.3 mg total) into the muscle once.   fesoterodine 8 MG Tb24 tablet Commonly known as:  TOVIAZ Take 1 tablet (8 mg total) by mouth daily.   gabapentin 300 MG capsule Commonly known as:  NEURONTIN Take 1 capsule (300 mg total) by mouth 3 (three) times daily.   lisinopril-hydrochlorothiazide 20-12.5 MG tablet Commonly known as:  ZESTORETIC Take 2 tablets by mouth daily.   medroxyPROGESTERone 10 MG tablet Commonly known as:  PROVERA Take 1 tablet (10 mg total) by mouth daily.   omeprazole 40 MG capsule Commonly known as:  PRILOSEC Take 1 capsule (40 mg total) by mouth daily.   pramipexole 1 MG tablet Commonly known as:  MIRAPEX Take 1 tablet (1 mg total) by mouth 2 (two) times daily.   PROAIR HFA 108 (90 Base) MCG/ACT inhaler Generic drug:  albuterol USE 2 INHALATIONS EVERY 6 HOURS AS NEEDED FOR  WHEEZING   topiramate 50 MG tablet Commonly known as:  TOPAMAX Take 1 tablet (50 mg total) by mouth 3 (three) times daily.   traMADol 50 MG tablet Commonly known as:  ULTRAM Take 1 tablet (50 mg total) by mouth 3 (three) times daily as needed.   traMADol 50 MG tablet Commonly known as:  ULTRAM Take 1 tablet (50 mg total) by mouth every 8 (eight) hours as needed.   traMADol 50 MG tablet Commonly known as:  ULTRAM Take 1 tablet (50 mg total) by mouth every 8 (eight) hours as needed.   zolpidem 10 MG tablet Commonly known as:  AMBIEN Take 1 tablet (10 mg total) by mouth at bedtime.          Objective:    BP 133/87   Pulse 82   Temp 98.7 F (37.1 C) (Oral)   Ht 5\' 1"  (1.549 m)   Wt 247 lb 9 oz (112.3 kg)   LMP 09/13/2012   BMI 46.78 kg/m   Wt Readings from Last 3 Encounters:  07/29/16 247 lb 9 oz (112.3 kg)  11/01/15 244 lb 3.2 oz (110.8 kg)  08/01/15 242 lb (109.8 kg)    Physical Exam  Constitutional: She is oriented to person, place, and time. She appears well-developed and well-nourished. No distress.  HENT:  Right Ear: External ear and ear canal normal. Tympanic membrane is bulging. Tympanic membrane is not injected, not scarred, not perforated and not erythematous.  Left Ear: External ear and ear canal normal. Tympanic membrane is bulging. Tympanic membrane is not injected, not scarred, not perforated and not erythematous.  Nose: Mucosal edema and rhinorrhea present. No epistaxis. Right sinus exhibits maxillary sinus tenderness. Right sinus exhibits no frontal sinus tenderness. Left sinus exhibits maxillary sinus tenderness. Left sinus exhibits no frontal sinus tenderness.  Mouth/Throat: Uvula is midline and mucous membranes are normal. Posterior oropharyngeal edema and posterior oropharyngeal erythema present. No oropharyngeal exudate or tonsillar abscesses.  Eyes: Conjunctivae are normal.  Cardiovascular: Normal rate, regular rhythm, normal heart sounds and intact  distal pulses.   No murmur heard. Pulmonary/Chest: Effort normal and breath sounds normal. No respiratory distress. She has no wheezes. She has no rales.  Musculoskeletal: Normal range of motion. She exhibits no edema or tenderness.  Neurological: She is alert and oriented to person, place, and time. Coordination normal.  Skin: Skin is warm and dry. No rash noted. She is not diaphoretic.  Psychiatric: She has a normal mood and affect. Her behavior is normal.  Vitals reviewed.       Assessment & Plan:   Problem List Items Addressed This Visit    None    Visit Diagnoses    Acute bronchitis, unspecified organism    -  Primary   Relevant Medications   doxycycline (VIBRA-TABS) 100 MG tablet  Follow up plan: Return if symptoms worsen or fail to improve.  Counseling provided for all of the vaccine components No orders of the defined types were placed in this encounter.   Arville CareJoshua Rena Sweeden, MD [facility] 07/29/2016, 8:47 AM

## 2016-08-07 ENCOUNTER — Encounter: Payer: Self-pay | Admitting: Nurse Practitioner

## 2016-08-07 ENCOUNTER — Ambulatory Visit (INDEPENDENT_AMBULATORY_CARE_PROVIDER_SITE_OTHER): Admitting: Nurse Practitioner

## 2016-08-07 VITALS — BP 136/96 | HR 92 | Temp 98.0°F | Ht 61.0 in | Wt 246.0 lb

## 2016-08-07 DIAGNOSIS — E782 Mixed hyperlipidemia: Secondary | ICD-10-CM

## 2016-08-07 DIAGNOSIS — K219 Gastro-esophageal reflux disease without esophagitis: Secondary | ICD-10-CM | POA: Diagnosis not present

## 2016-08-07 DIAGNOSIS — I10 Essential (primary) hypertension: Secondary | ICD-10-CM | POA: Diagnosis not present

## 2016-08-07 DIAGNOSIS — G43009 Migraine without aura, not intractable, without status migrainosus: Secondary | ICD-10-CM | POA: Diagnosis not present

## 2016-08-07 DIAGNOSIS — M797 Fibromyalgia: Secondary | ICD-10-CM | POA: Diagnosis not present

## 2016-08-07 DIAGNOSIS — R059 Cough, unspecified: Secondary | ICD-10-CM

## 2016-08-07 DIAGNOSIS — R3915 Urgency of urination: Secondary | ICD-10-CM | POA: Diagnosis not present

## 2016-08-07 DIAGNOSIS — G2581 Restless legs syndrome: Secondary | ICD-10-CM

## 2016-08-07 DIAGNOSIS — R05 Cough: Secondary | ICD-10-CM | POA: Diagnosis not present

## 2016-08-07 DIAGNOSIS — F5101 Primary insomnia: Secondary | ICD-10-CM | POA: Diagnosis not present

## 2016-08-07 DIAGNOSIS — F3342 Major depressive disorder, recurrent, in full remission: Secondary | ICD-10-CM | POA: Diagnosis not present

## 2016-08-07 MED ORDER — DULOXETINE HCL 60 MG PO CPEP: 60.0000 mg | ORAL_CAPSULE | Freq: Every day | ORAL | 1 refills | Status: DC

## 2016-08-07 MED ORDER — TOPIRAMATE 50 MG PO TABS: 50.0000 mg | ORAL_TABLET | Freq: Three times a day (TID) | ORAL | 1 refills | Status: DC

## 2016-08-07 MED ORDER — ATORVASTATIN CALCIUM 40 MG PO TABS: 40.0000 mg | ORAL_TABLET | Freq: Every day | ORAL | 1 refills | Status: DC

## 2016-08-07 MED ORDER — GABAPENTIN 300 MG PO CAPS: 300.0000 mg | ORAL_CAPSULE | Freq: Three times a day (TID) | ORAL | 1 refills | Status: DC

## 2016-08-07 MED ORDER — PRAMIPEXOLE DIHYDROCHLORIDE 1 MG PO TABS: 1.0000 mg | ORAL_TABLET | Freq: Two times a day (BID) | ORAL | 1 refills | Status: DC

## 2016-08-07 MED ORDER — TRAMADOL HCL 50 MG PO TABS: 50.0000 mg | ORAL_TABLET | Freq: Three times a day (TID) | ORAL | 0 refills | Status: DC | PRN

## 2016-08-07 MED ORDER — OMEPRAZOLE 40 MG PO CPDR: 40.0000 mg | DELAYED_RELEASE_CAPSULE | Freq: Every day | ORAL | 1 refills | Status: DC

## 2016-08-07 MED ORDER — BENZONATATE 100 MG PO CAPS: 100.0000 mg | ORAL_CAPSULE | Freq: Three times a day (TID) | ORAL | 0 refills | Status: DC | PRN

## 2016-08-07 MED ORDER — METHYLPREDNISOLONE ACETATE 80 MG/ML IJ SUSP
80.0000 mg | Freq: Once | INTRAMUSCULAR | Status: AC
  Administered 2016-08-07: 80 mg via INTRAMUSCULAR

## 2016-08-07 MED ORDER — TRAMADOL HCL 50 MG PO TABS: 50.0000 mg | ORAL_TABLET | Freq: Three times a day (TID) | ORAL | 1 refills | Status: DC | PRN

## 2016-08-07 MED ORDER — LISINOPRIL-HYDROCHLOROTHIAZIDE 20-12.5 MG PO TABS
2.0000 | ORAL_TABLET | Freq: Every day | ORAL | 1 refills | Status: DC
Start: 2016-08-07 — End: 2016-08-20

## 2016-08-07 MED ORDER — ZOLPIDEM TARTRATE 10 MG PO TABS: 10.0000 mg | ORAL_TABLET | Freq: Every day | ORAL | 2 refills | Status: DC

## 2016-08-07 MED ORDER — FESOTERODINE FUMARATE ER 8 MG PO TB24: 8.0000 mg | ORAL_TABLET | Freq: Every day | ORAL | 1 refills | Status: DC

## 2016-08-07 NOTE — Patient Instructions (Signed)
Cough, Adult  A cough helps to clear your throat and lungs. A cough may last only 2?3 weeks (acute), or it may last longer than 8 weeks (chronic). Many different things can cause a cough. A cough may be a sign of an illness or another medical condition.  Follow these instructions at home:  ? Pay attention to any changes in your cough.  ? Take medicines only as told by your doctor.  ? If you were prescribed an antibiotic medicine, take it as told by your doctor. Do not stop taking it even if you start to feel better.  ? Talk with your doctor before you try using a cough medicine.  ? Drink enough fluid to keep your pee (urine) clear or pale yellow.  ? If the air is dry, use a cold steam vaporizer or humidifier in your home.  ? Stay away from things that make you cough at work or at home.  ? If your cough is worse at night, try using extra pillows to raise your head up higher while you sleep.  ? Do not smoke, and try not to be around smoke. If you need help quitting, ask your doctor.  ? Do not have caffeine.  ? Do not drink alcohol.  ? Rest as needed.  Contact a doctor if:  ? You have new problems (symptoms).  ? You cough up yellow fluid (pus).  ? Your cough does not get better after 2?3 weeks, or your cough gets worse.  ? Medicine does not help your cough and you are not sleeping well.  ? You have pain that gets worse or pain that is not helped with medicine.  ? You have a fever.  ? You are losing weight and you do not know why.  ? You have night sweats.  Get help right away if:  ? You cough up blood.  ? You have trouble breathing.  ? Your heartbeat is very fast.  This information is not intended to replace advice given to you by your health care provider. Make sure you discuss any questions you have with your health care provider.  Document Released: 01/29/2011 Document Revised: 10/24/2015 Document Reviewed: 07/25/2014  Elsevier Interactive Patient Education ? 2017 [employer] Inc[employer]

## 2016-08-07 NOTE — Progress Notes (Signed)
Subjective:    Patient ID: Marie Carter, female    DOB: 05-Mar-1961, 56 y.o.   MRN: [id number]   Patient here today for follow up of chronic medical problems. No complaints today.  Outpatient Encounter Prescriptions as of 02/04/2016  Medication Sig  . atorvastatin (LIPITOR) 40 MG tablet Take 1 tablet (40 mg total) by mouth daily.  . baclofen (LIORESAL) 10 MG tablet Take 1 tablet (10 mg total) by mouth 3 (three) times daily.  . DULoxetine (CYMBALTA) 60 MG capsule TAKE 1 CAPSULE DAILY  . eletriptan (RELPAX) 20 MG tablet Take 2 tablets (40 mg total) by mouth as needed. may repeat in 2 hours if necessary  . EPINEPHrine 0.3 mg/0.3 mL IJ SOAJ injection Inject 0.3 mLs (0.3 mg total) into the muscle once.  . fesoterodine (TOVIAZ) 8 MG TB24 tablet Take 1 tablet (8 mg total) by mouth daily.  [name] gabapentin (NEURONTIN) 300 MG capsule Take 1 capsule (300 mg total) by mouth 3 (three) times daily.  [name] lisinopril-hydrochlorothiazide (ZESTORETIC) 20-12.5 MG tablet Take 2 tablets by mouth daily.  [name] omeprazole (PRILOSEC) 40 MG capsule TAKE 1 CAPSULE DAILY  . pramipexole (MIRAPEX) 1 MG tablet Take 1 tablet (1 mg total) by mouth 2 (two) times daily.  [name] PROAIR HFA 108 (90 Base) MCG/ACT inhaler USE 2 INHALATIONS EVERY 6 HOURS AS NEEDED FOR WHEEZING  . topiramate (TOPAMAX) 50 MG tablet Take 1 tablet (50 mg total) by mouth 3 (three) times daily.  . traMADol (ULTRAM) 50 MG tablet Take 1 tablet (50 mg total) by mouth 3 (three) times daily as needed.  . zolpidem (AMBIEN) 10 MG tablet Take 1 tablet (10 mg total) by mouth at bedtime.   C/O still having a cough- was seen last week and was diagnosed with bronchitis- was given doxycycline- cough has gotten no better.  .Hypertension  This is a chronic problem. The current episode started more than 1 year ago. The problem is unchanged. The problem is uncontrolled. Risk factors for coronary artery disease include dyslipidemia, obesity, post-menopausal state and sedentary  lifestyle. Past treatments include ACE inhibitors and diuretics (changed meds at last viist but [facility] still has not sent new med dose- did not take meds today.). The current treatment provides moderate improvement. Compliance problems include diet and exercise.  Hypertensive end-organ damage includes CVA. There is no history of CAD/MI.  Hyperlipidemia  This is a chronic problem. The current episode started more than 1 year ago. Recent lipid tests were reviewed and are variable. Exacerbating diseases include obesity. She has no history of diabetes or hypothyroidism. Pertinent negatives include no myalgias. Current antihyperlipidemic treatment includes statins. The current treatment provides moderate improvement of lipids. Compliance problems include adherence to diet and adherence to exercise.  Risk factors for coronary artery disease include dyslipidemia, hypertension, obesity and post-menopausal.  fibromyalgia Currently on cymbalta and muscle relaxor- doing okay always has some pain but continues to work through it- uses and occasional ultram. Pain assessment: Cause of pain- Fibromyalgia Pain location- worse in shoulder areas Pain on scale of 1-10- 6/10 Frequency- daily What increases pain- doing repetitive work What makes pain Better- resting helps Effects on ADL - some days cannot do anything Any change in general medical condition--- none  Current medications- ultram Effectiveness of current meds-helps ease pain but des not completely take away Adverse reactions form pain meds-- none Morphine equivalent 15.0  Pill count performed-No Urine drug screen- No Was the [place] reviewed- yes  If yes were their any concerning  findings? - no suspicious activity  migraine Has cut down on caffeine and that has really helped  Her frequency of migraines- still taking topamax and occasional relpax. CVA history/left sided weakness Doing well still walks with a cane RLS mirapex working  well GERD Currently on omeprazole daily which works well to keep symptoms under control Insomnia ambien nightly- cannot sleep without taking depression cymbalta helping- no crying daily like she use to urinary urgency [name] helps urgency- no incontinence     Review of Systems  Constitutional: Negative.   HENT: Negative.   Respiratory: Negative.   Cardiovascular: Negative.   Gastrointestinal: Negative.   Genitourinary: Negative.   Musculoskeletal: Negative for myalgias.  Neurological: Negative.   Psychiatric/Behavioral: Negative.   All other systems reviewed and are negative.      Objective:   Physical Exam  Constitutional: She is oriented to person, place, and time. She appears well-developed and well-nourished.  HENT:  Nose: Nose normal.  Mouth/Throat: Oropharynx is clear and moist.  Eyes: EOM are normal.  Neck: Trachea normal, normal range of motion and full passive range of motion without pain. Neck supple. No JVD present. Carotid bruit is not present. No thyromegaly present.  Cardiovascular: Normal rate, regular rhythm, normal heart sounds and intact distal pulses.  Exam reveals no gallop and no friction rub.   No murmur heard. Pulmonary/Chest: Effort normal and breath sounds normal.  Abdominal: Soft. Bowel sounds are normal. She exhibits no distension and no mass. There is no tenderness.  Musculoskeletal: Normal range of motion. She exhibits edema (1+edema bil lowe rext.).  Multiple tender points up and down back  Lymphadenopathy:    She has no cervical adenopathy.  Neurological: She is alert and oriented to person, place, and time. She has normal reflexes.  Skin: Skin is warm and dry.  Psychiatric: She has a normal mood and affect. Her behavior is normal. Judgment and thought content normal.   BP (!) 136/96   Pulse 92   Temp 98 F (36.7 C) (Oral)   Ht 5' 1"  (1.549 m)   Wt 246 lb (111.6 kg)   LMP 09/13/2012   BMI 46.48 kg/m         Assessment & Plan:   1. Essential hypertension Low sodium diet - lisinopril-hydrochlorothiazide (ZESTORETIC) 20-12.5 MG tablet; Take 2 tablets by mouth daily.  Dispense: 180 tablet; Refill: 1 - CMP14+EGFR  2. Gastroesophageal reflux disease without esophagitis Avoid spicy foods Do not eat 2 hours prior to bedtime - omeprazole (PRILOSEC) 40 MG capsule; Take 1 capsule (40 mg total) by mouth daily.  Dispense: 90 capsule; Refill: 1  3. Recurrent major depressive disorder, in full remission ([place]) Stress management  4. Fibromyalgia Exercise will help with pain - DULoxetine (CYMBALTA) 60 MG capsule; Take 1 capsule (60 mg total) by mouth daily.  Dispense: 90 capsule; Refill: 1 - gabapentin (NEURONTIN) 300 MG capsule; Take 1 capsule (300 mg total) by mouth 3 (three) times daily.  Dispense: 540 capsule; Refill: 1 - traMADol (ULTRAM) 50 MG tablet; Take 1 tablet (50 mg total) by mouth every 8 (eight) hours as needed.  Dispense: 30 tablet; Refill: 0 - traMADol (ULTRAM) 50 MG tablet; Take 1 tablet (50 mg total) by mouth every 8 (eight) hours as needed.  Dispense: 30 tablet; Refill: 0 - traMADol (ULTRAM) 50 MG tablet; Take 1 tablet (50 mg total) by mouth 3 (three) times daily as needed.  Dispense: 90 tablet; Refill: 1  5. Mixed hyperlipidemia Low fat diet - atorvastatin (LIPITOR)  40 MG tablet; Take 1 tablet (40 mg total) by mouth daily.  Dispense: 90 tablet; Refill: 1 - Lipid panel  6. Primary insomnia Bedtime routine - zolpidem (AMBIEN) 10 MG tablet; Take 1 tablet (10 mg total) by mouth at bedtime.  Dispense: 30 tablet; Refill: 2  7. RLS (restless legs syndrome) Keep legs warm at night - pramipexole (MIRAPEX) 1 MG tablet; Take 1 tablet (1 mg total) by mouth 2 (two) times daily.  Dispense: 180 tablet; Refill: 1  8. Severe obesity (BMI >= 40) (HCC) Discussed diet and exercise for person with BMI >25 Will recheck weight in 3-6 months  9. Migraine without aura and without status migrainosus, not intractable -  topiramate (TOPAMAX) 50 MG tablet; Take 1 tablet (50 mg total) by mouth 3 (three) times daily.  Dispense: 270 tablet; Refill: 1  10. Urinary urgency - fesoterodine (TOVIAZ) 8 MG TB24 tablet; Take 1 tablet (8 mg total) by mouth daily.  Dispense: 90 tablet; Refill: 1  11. Cough Force fluids Use inhalers as needed rto PRN - methylPREDNISolone acetate (DEPO-MEDROL) injection 80 mg; Inject 1 mL (80 mg total) into the muscle once. - benzonatate (TESSALON PERLES) 100 MG capsule; Take 1 capsule (100 mg total) by mouth 3 (three) times daily as needed for cough.  Dispense: 20 capsule; Refill: 0    Labs pending Health maintenance reviewed Diet and exercise encouraged Continue all meds Follow up  In 3 months   [place], FNP

## 2016-08-08 LAB — LIPID PANEL
CHOL/HDL RATIO: 3.2 ratio (ref 0.0–4.4)
Cholesterol, Total: 183 mg/dL (ref 100–199)
HDL: 57 mg/dL (ref >39)
LDL Calculated: 106 mg/dL — ABNORMAL HIGH (ref 0–99)
TRIGLYCERIDES: 102 mg/dL (ref 0–149)
VLDL Cholesterol Cal: 20 mg/dL (ref 5–40)

## 2016-08-08 LAB — CMP14+EGFR
ALBUMIN: 4 g/dL (ref 3.5–5.5)
ALT: 15 IU/L (ref 0–32)
AST: 13 IU/L (ref 0–40)
Albumin/Globulin Ratio: 1.3 (ref 1.2–2.2)
Alkaline Phosphatase: 131 IU/L — ABNORMAL HIGH (ref 39–117)
BILIRUBIN TOTAL: 0.3 mg/dL (ref 0.0–1.2)
BUN / CREAT RATIO: 17 (ref 9–23)
BUN: 18 mg/dL (ref 6–24)
CO2: 27 mmol/L (ref 18–29)
Calcium: 9.4 mg/dL (ref 8.7–10.2)
Chloride: 99 mmol/L (ref 96–106)
Creatinine, Ser: 1.08 mg/dL — ABNORMAL HIGH (ref 0.57–1.00)
GFR calc Af Amer: 66 mL/min/{1.73_m2} (ref >59)
GFR calc non Af Amer: 57 mL/min/{1.73_m2} — ABNORMAL LOW (ref >59)
GLOBULIN, TOTAL: 3.2 g/dL (ref 1.5–4.5)
Glucose: 85 mg/dL (ref 65–99)
Potassium: 4.4 mmol/L (ref 3.5–5.2)
SODIUM: 141 mmol/L (ref 134–144)
Total Protein: 7.2 g/dL (ref 6.0–8.5)

## 2016-08-20 ENCOUNTER — Other Ambulatory Visit: Payer: Self-pay | Admitting: Nurse Practitioner

## 2016-08-20 DIAGNOSIS — G2581 Restless legs syndrome: Secondary | ICD-10-CM

## 2016-08-20 DIAGNOSIS — I10 Essential (primary) hypertension: Secondary | ICD-10-CM

## 2016-08-20 DIAGNOSIS — E782 Mixed hyperlipidemia: Secondary | ICD-10-CM

## 2016-08-20 DIAGNOSIS — G43009 Migraine without aura, not intractable, without status migrainosus: Secondary | ICD-10-CM

## 2016-10-29 ENCOUNTER — Other Ambulatory Visit: Payer: Self-pay | Admitting: Nurse Practitioner

## 2016-10-29 DIAGNOSIS — M797 Fibromyalgia: Secondary | ICD-10-CM

## 2016-10-29 DIAGNOSIS — K219 Gastro-esophageal reflux disease without esophagitis: Secondary | ICD-10-CM

## 2016-11-06 ENCOUNTER — Encounter: Payer: Self-pay | Admitting: Nurse Practitioner

## 2016-11-06 ENCOUNTER — Ambulatory Visit (INDEPENDENT_AMBULATORY_CARE_PROVIDER_SITE_OTHER): Admitting: Nurse Practitioner

## 2016-11-06 VITALS — HR 81 | Temp 97.6°F | Ht 61.0 in | Wt 249.6 lb

## 2016-11-06 DIAGNOSIS — I10 Essential (primary) hypertension: Secondary | ICD-10-CM | POA: Diagnosis not present

## 2016-11-06 DIAGNOSIS — F3342 Major depressive disorder, recurrent, in full remission: Secondary | ICD-10-CM

## 2016-11-06 DIAGNOSIS — F5101 Primary insomnia: Secondary | ICD-10-CM

## 2016-11-06 DIAGNOSIS — Z1231 Encounter for screening mammogram for malignant neoplasm of breast: Secondary | ICD-10-CM

## 2016-11-06 DIAGNOSIS — M797 Fibromyalgia: Secondary | ICD-10-CM

## 2016-11-06 DIAGNOSIS — E782 Mixed hyperlipidemia: Secondary | ICD-10-CM

## 2016-11-06 DIAGNOSIS — K219 Gastro-esophageal reflux disease without esophagitis: Secondary | ICD-10-CM

## 2016-11-06 DIAGNOSIS — G2581 Restless legs syndrome: Secondary | ICD-10-CM

## 2016-11-06 MED ORDER — TRAMADOL HCL 50 MG PO TABS: 50.0000 mg | ORAL_TABLET | Freq: Three times a day (TID) | ORAL | 0 refills | Status: DC | PRN

## 2016-11-06 MED ORDER — ZOLPIDEM TARTRATE 10 MG PO TABS: 10.0000 mg | ORAL_TABLET | Freq: Every day | ORAL | 2 refills | Status: DC

## 2016-11-06 MED ORDER — DULOXETINE HCL 60 MG PO CPEP: 60.0000 mg | ORAL_CAPSULE | Freq: Every day | ORAL | 0 refills | Status: DC

## 2016-11-06 MED ORDER — TRAMADOL HCL 50 MG PO TABS: 50.0000 mg | ORAL_TABLET | Freq: Three times a day (TID) | ORAL | 1 refills | Status: DC | PRN

## 2016-11-06 NOTE — Patient Instructions (Signed)

## 2016-11-06 NOTE — Progress Notes (Signed)
Subjective:    Patient ID: Marie Carter, female    DOB: 10-20-1960, 56 y.o.   MRN: [id number]  HPI   Marie Singeramela Centola is here today for follow up of chronic medical problem.  Outpatient Encounter Prescriptions as of 11/06/2016  Medication Sig  . atorvastatin (LIPITOR) 40 MG tablet TAKE 1 TABLET DAILY  . baclofen (LIORESAL) 10 MG tablet Take 1 tablet (10 mg total) by mouth 3 (three) times daily.  . baclofen (LIORESAL) 10 MG tablet TAKE 1 TABLET THREE TIMES A DAY  . benzonatate (TESSALON PERLES) 100 MG capsule Take 1 capsule (100 mg total) by mouth 3 (three) times daily as needed for cough.  . doxycycline (VIBRA-TABS) 100 MG tablet Take 1 tablet (100 mg total) by mouth 2 (two) times daily. 1 po bid  . DULoxetine (CYMBALTA) 60 MG capsule Take 1 capsule (60 mg total) by mouth daily.  [name]. EPINEPHrine 0.3 mg/0.3 mL IJ SOAJ injection Inject 0.3 mLs (0.3 mg total) into the muscle once.  . fesoterodine (TOVIAZ) 8 MG TB24 tablet Take 1 tablet (8 mg total) by mouth daily.  [name]. gabapentin (NEURONTIN) 300 MG capsule Take 1 capsule (300 mg total) by mouth 3 (three) times daily.  [name]. lisinopril-hydrochlorothiazide (PRINZIDE,ZESTORETIC) 20-12.5 MG tablet TAKE 2 TABLETS DAILY  . medroxyPROGESTERone (PROVERA) 10 MG tablet Take 1 tablet (10 mg total) by mouth daily.  [name]. omeprazole (PRILOSEC) 40 MG capsule Take 1 capsule (40 mg total) by mouth daily.  . pramipexole (MIRAPEX) 1 MG tablet TAKE 1 TABLET TWICE A DAY  . PROAIR HFA 108 (90 Base) MCG/ACT inhaler USE 2 INHALATIONS EVERY 6 HOURS AS NEEDED FOR WHEEZING  . topiramate (TOPAMAX) 50 MG tablet TAKE 1 TABLET THREE TIMES A DAY  . traMADol (ULTRAM) 50 MG tablet Take 1 tablet (50 mg total) by mouth every 8 (eight) hours as needed.  . traMADol (ULTRAM) 50 MG tablet Take 1 tablet (50 mg total) by mouth every 8 (eight) hours as needed.  . traMADol (ULTRAM) 50 MG tablet Take 1 tablet (50 mg total) by mouth 3 (three) times daily as needed.  . zolpidem (AMBIEN) 10 MG tablet  Take 1 tablet (10 mg total) by mouth at bedtime.   No facility-administered encounter medications on file as of 11/06/2016.     1. Essential hypertension  No c/o chest pain,SOB or HA- does not check blood pressures at home  2. Gastroesophageal reflux disease without esophagitis  Omeprazole works well to keep symptoms under control  3. Severe obesity (BMI >= 40) (HCC)  No recent weight changes  4. RLS (restless legs syndrome)  mirapex works well  5. Primary insomnia  Cannot sleep without her ambien  6. Mixed hyperlipidemia  Does not watch diet  7. Fibromyalgia  Has chronic pain- is on ultram daily- still has pain but ultram really helps. She is also on cymbalta and neurotin.  8. Recurrent major depressive disorder, in full remission (HCC)  cymbalta helps    New complaints: None today    Review of Systems  Constitutional: Negative for activity change and appetite change.  HENT: Negative.   Eyes: Negative for pain.  Respiratory: Negative.  Negative for shortness of breath.   Cardiovascular: Negative for chest pain, palpitations and leg swelling.  Gastrointestinal: Negative for abdominal pain.  Endocrine: Negative for polydipsia.  Genitourinary: Negative.   Musculoskeletal: Positive for myalgias.  Skin: Negative for rash.  Neurological: Negative for dizziness, weakness and headaches.  Hematological: Does not bruise/bleed easily.  Psychiatric/Behavioral: Negative.  All other systems reviewed and are negative.      Objective:   Physical Exam  Constitutional: She is oriented to person, place, and time. She appears well-developed and well-nourished.  HENT:  Nose: Nose normal.  Mouth/Throat: Oropharynx is clear and moist.  Eyes: EOM are normal.  Neck: Trachea normal, normal range of motion and full passive range of motion without pain. Neck supple. No JVD present. Carotid bruit is not present. No thyromegaly present.  Cardiovascular: Normal rate, regular rhythm, normal  heart sounds and intact distal pulses.  Exam reveals no gallop and no friction rub.   No murmur heard. Pulmonary/Chest: Effort normal and breath sounds normal.  Abdominal: Soft. Bowel sounds are normal. She exhibits no distension and no mass. There is no tenderness.  Musculoskeletal: Normal range of motion.  Ambulating with walker today Multiple tender points up and down back bil  Lymphadenopathy:    She has no cervical adenopathy.  Neurological: She is alert and oriented to person, place, and time. She has normal reflexes.  Skin: Skin is warm and dry.  Psychiatric: She has a normal mood and affect. Her behavior is normal. Judgment and thought content normal.   Pulse 81   Temp 97.6 F (36.4 C) (Oral)   Ht 5\' 1"  (1.549 m)   Wt 249 lb 9.6 oz (113.2 kg)   LMP 09/13/2012   BMI 47.16 kg/m        Assessment & Plan:  1. Essential hypertension Low sodium diet  2. Gastroesophageal reflux disease without esophagitis Avoid spicy foods Do not eat 2 hours prior to bedtime  3. Severe obesity (BMI >= 40) (HCC) Discussed diet and exercise for person with BMI >25 Will recheck weight in 3-6 months  4. RLS (restless legs syndrome) Keep legs warm at night  5. Primary insomnia Bedtime routine - zolpidem (AMBIEN) 10 MG tablet; Take 1 tablet (10 mg total) by mouth at bedtime.  Dispense: 30 tablet; Refill: 2  6. Mixed hyperlipidemia Low fat diet  7. Fibromyalgia Exercise will help keep muscles warm and decrease pain NEEDS TO MAKE APPOINTMENT FOR PAIN MANAGEMENT- CONTRACT ETC - traMADol (ULTRAM) 50 MG tablet; Take 1 tablet (50 mg total) by mouth every 8 (eight) hours as needed.  Dispense: 30 tablet; Refill: 0 - traMADol (ULTRAM) 50 MG tablet; Take 1 tablet (50 mg total) by mouth every 8 (eight) hours as needed.  Dispense: 30 tablet; Refill: 0 - traMADol (ULTRAM) 50 MG tablet; Take 1 tablet (50 mg total) by mouth 3 (three) times daily as needed.  Dispense: 90 tablet; Refill: 1 - DULoxetine  (CYMBALTA) 60 MG capsule; Take 1 capsule (60 mg total) by mouth daily.  Dispense: 90 capsule; Refill: 0  8. Recurrent major depressive disorder, in full remission (HCC) Stress management  9. Screening mammogram, encounter for - MM Digital Screening; Future    Labs pending Health maintenance reviewed Diet and exercise encouraged Continue all meds Follow up  In 3 months   Mary-Margaret Daphine Deutscher, FNP

## 2016-11-13 ENCOUNTER — Telehealth: Payer: Self-pay | Admitting: Nurse Practitioner

## 2016-11-13 NOTE — Telephone Encounter (Signed)
Pt notified that [facility] is out of town until 11/23/2016 Form on [facility] desk We will call when ready for pick up

## 2017-02-08 ENCOUNTER — Ambulatory Visit (INDEPENDENT_AMBULATORY_CARE_PROVIDER_SITE_OTHER): Admitting: Nurse Practitioner

## 2017-02-08 ENCOUNTER — Encounter: Payer: Self-pay | Admitting: Nurse Practitioner

## 2017-02-08 VITALS — BP 166/94 | HR 76 | Temp 97.5°F | Ht 61.0 in | Wt 257.0 lb

## 2017-02-08 DIAGNOSIS — R3915 Urgency of urination: Secondary | ICD-10-CM

## 2017-02-08 DIAGNOSIS — M797 Fibromyalgia: Secondary | ICD-10-CM

## 2017-02-08 DIAGNOSIS — I10 Essential (primary) hypertension: Secondary | ICD-10-CM

## 2017-02-08 DIAGNOSIS — G2581 Restless legs syndrome: Secondary | ICD-10-CM

## 2017-02-08 DIAGNOSIS — E782 Mixed hyperlipidemia: Secondary | ICD-10-CM | POA: Diagnosis not present

## 2017-02-08 DIAGNOSIS — F5101 Primary insomnia: Secondary | ICD-10-CM

## 2017-02-08 DIAGNOSIS — F3342 Major depressive disorder, recurrent, in full remission: Secondary | ICD-10-CM | POA: Diagnosis not present

## 2017-02-08 DIAGNOSIS — G43009 Migraine without aura, not intractable, without status migrainosus: Secondary | ICD-10-CM

## 2017-02-08 DIAGNOSIS — K219 Gastro-esophageal reflux disease without esophagitis: Secondary | ICD-10-CM | POA: Diagnosis not present

## 2017-02-08 MED ORDER — ZOLPIDEM TARTRATE 10 MG PO TABS: 10.0000 mg | ORAL_TABLET | Freq: Every day | ORAL | 2 refills | Status: DC

## 2017-02-08 MED ORDER — DULOXETINE HCL 60 MG PO CPEP: 60.0000 mg | ORAL_CAPSULE | Freq: Every day | ORAL | 1 refills | Status: DC

## 2017-02-08 MED ORDER — LISINOPRIL-HYDROCHLOROTHIAZIDE 20-12.5 MG PO TABS: 2.0000 | ORAL_TABLET | Freq: Every day | ORAL | 1 refills | Status: DC

## 2017-02-08 MED ORDER — ATORVASTATIN CALCIUM 40 MG PO TABS: 40.0000 mg | ORAL_TABLET | Freq: Every day | ORAL | 1 refills | Status: DC

## 2017-02-08 MED ORDER — TRAMADOL HCL 50 MG PO TABS: 50.0000 mg | ORAL_TABLET | Freq: Three times a day (TID) | ORAL | 1 refills | Status: DC | PRN

## 2017-02-08 MED ORDER — GABAPENTIN 300 MG PO CAPS: 300.0000 mg | ORAL_CAPSULE | Freq: Three times a day (TID) | ORAL | 1 refills | Status: DC

## 2017-02-08 MED ORDER — BACLOFEN 10 MG PO TABS: 10.0000 mg | ORAL_TABLET | Freq: Three times a day (TID) | ORAL | 5 refills | Status: DC

## 2017-02-08 MED ORDER — TRAMADOL HCL 50 MG PO TABS: 50.0000 mg | ORAL_TABLET | Freq: Three times a day (TID) | ORAL | 0 refills | Status: DC | PRN

## 2017-02-08 MED ORDER — TOPIRAMATE 50 MG PO TABS: 50.0000 mg | ORAL_TABLET | Freq: Three times a day (TID) | ORAL | 1 refills | Status: DC

## 2017-02-08 MED ORDER — FESOTERODINE FUMARATE ER 8 MG PO TB24: 8.0000 mg | ORAL_TABLET | Freq: Every day | ORAL | 1 refills | Status: DC

## 2017-02-08 MED ORDER — PRAMIPEXOLE DIHYDROCHLORIDE 1 MG PO TABS: 1.0000 mg | ORAL_TABLET | Freq: Two times a day (BID) | ORAL | 1 refills | Status: DC

## 2017-02-08 MED ORDER — PANTOPRAZOLE SODIUM 40 MG PO TBEC: 40.0000 mg | DELAYED_RELEASE_TABLET | Freq: Every day | ORAL | 3 refills | Status: DC

## 2017-02-08 NOTE — Addendum Note (Signed)
Addended by: Cleda DaubUCKER, Kelvon Giannini G on: 02/08/2017 12:53 PM   Modules accepted: Orders

## 2017-02-08 NOTE — Patient Instructions (Signed)
Myofascial Pain Syndrome and Fibromyalgia Myofascial pain syndrome and fibromyalgia are both pain disorders. This pain may be felt mainly in your muscles.  Myofascial pain syndrome: ? Always has trigger points or tender points in the muscle that will cause pain when pressed. The pain may come and go. ? Usually affects your neck, upper back, and shoulder areas. The pain often radiates into your arms and hands.  Fibromyalgia: ? Has muscle pains and tenderness that come and go. ? Is often associated with fatigue and sleep disturbances. ? Has trigger points. ? Tends to be long-lasting (chronic), but is not life-threatening.  Fibromyalgia and myofascial pain are not the same. However, they often occur together. If you have both conditions, each can make the other worse. Both are common and can cause enough pain and fatigue to make day-to-day activities difficult. What are the causes? The exact causes of fibromyalgia and myofascial pain are not known. People with certain gene types may be more likely to develop fibromyalgia. Some factors can be triggers for both conditions, such as:  Spine disorders.  Arthritis.  Severe injury (trauma) and other physical stressors.  Being under a lot of stress.  A medical illness.  What are the signs or symptoms? Fibromyalgia The main symptom of fibromyalgia is widespread pain and tenderness in your muscles. This can vary over time. Pain is sometimes described as stabbing, shooting, or burning. You may have tingling or numbness, too. You may also have sleep problems and fatigue. You may wake up feeling tired and groggy (fibro fog). Other symptoms may include:  Bowel and bladder problems.  Headaches.  Visual problems.  Problems with odors and noises.  Depression or mood changes.  Painful menstrual periods (dysmenorrhea).  Dry skin or eyes.  Myofascial pain syndrome Symptoms of myofascial pain syndrome include:  Tight, ropy bands of  muscle.  Uncomfortable sensations in muscular areas, such as: ? Aching. ? Cramping. ? Burning. ? Numbness. ? Tingling. ? Muscle weakness.  Trouble moving certain muscles freely (range of motion).  How is this diagnosed? There are no specific tests to diagnose fibromyalgia or myofascial pain syndrome. Both can be hard to diagnose because their symptoms are common in many other conditions. Your health care provider may suspect one or both of these conditions based on your symptoms and medical history. Your health care provider will also do a physical exam. The key to diagnosing fibromyalgia is having pain, fatigue, and other symptoms for more than three months that cannot be explained by another condition. The key to diagnosing myofascial pain syndrome is finding trigger points in muscles that are tender and cause pain elsewhere in your body (referred pain). How is this treated? Treating fibromyalgia and myofascial pain often requires a team of health care providers. This usually starts with your primary provider and a physical therapist. You may also find it helpful to work with alternative health care providers, such as massage therapists or acupuncturists. Treatment for fibromyalgia may include medicines. This may include nonsteroidal anti-inflammatory drugs (NSAIDs), along with other medicines. Treatment for myofascial pain may also include:  NSAIDs.  Cooling and stretching of muscles.  Trigger point injections.  Sound wave (ultrasound) treatments to stimulate muscles.  Follow these instructions at home:  Take medicines only as directed by your health care provider.  Exercise as directed by your health care provider or physical therapist.  Try to avoid stressful situations.  Practice relaxation techniques to control your stress. You may want to try: ? Biofeedback. ? Visual   imagery. ? Hypnosis. ? Muscle relaxation. ? Yoga. ? Meditation.  Talk to your health care provider  about alternative treatments, such as acupuncture or massage treatment.  Maintain a healthy lifestyle. This includes eating a healthy diet and getting enough sleep.  Consider joining a support group.  Do not do activities that stress or strain your muscles. That includes repetitive motions and heavy lifting. Where to find more information:  National Fibromyalgia Association: www.fmaware.org  Arthritis Foundation: www.arthritis.org  American Chronic Pain Association: www.theacpa.org/condition/myofascial-pain Contact a health care provider if:  You have new symptoms.  Your symptoms get worse.  You have side effects from your medicines.  You have trouble sleeping.  Your condition is causing depression or anxiety. This information is not intended to replace advice given to you by your health care provider. Make sure you discuss any questions you have with your health care provider. Document Released: 05/18/2005 Document Revised: 10/24/2015 Document Reviewed: 02/21/2014 Elsevier Interactive Patient Education  2018 Elsevier Inc.  

## 2017-02-08 NOTE — Progress Notes (Signed)
Subjective:    Patient ID: Marie Singeramela Carter, female    DOB: [birth year], 56 y.o.   MRN: [id number]  HPI  Marie Carter is here today for follow up of chronic medical problem.  Outpatient Encounter Prescriptions as of 02/08/2017  Medication Sig  . atorvastatin (LIPITOR) 40 MG tablet TAKE 1 TABLET DAILY  . baclofen (LIORESAL) 10 MG tablet Take 1 tablet (10 mg total) by mouth 3 (three) times daily.  . DULoxetine (CYMBALTA) 60 MG capsule Take 1 capsule (60 mg total) by mouth daily.  [name]. EPINEPHrine 0.3 mg/0.3 mL IJ SOAJ injection Inject 0.3 mLs (0.3 mg total) into the muscle once.  . fesoterodine (TOVIAZ) 8 MG TB24 tablet Take 1 tablet (8 mg total) by mouth daily.  [name]. gabapentin (NEURONTIN) 300 MG capsule Take 1 capsule (300 mg total) by mouth 3 (three) times daily.  [name]. lisinopril-hydrochlorothiazide (PRINZIDE,ZESTORETIC) 20-12.5 MG tablet TAKE 2 TABLETS DAILY  . medroxyPROGESTERone (PROVERA) 10 MG tablet Take 1 tablet (10 mg total) by mouth daily.  [name]. omeprazole (PRILOSEC) 40 MG capsule Take 1 capsule (40 mg total) by mouth daily.  . pramipexole (MIRAPEX) 1 MG tablet TAKE 1 TABLET TWICE A DAY  . PROAIR HFA 108 (90 Base) MCG/ACT inhaler USE 2 INHALATIONS EVERY 6 HOURS AS NEEDED FOR WHEEZING  . topiramate (TOPAMAX) 50 MG tablet TAKE 1 TABLET THREE TIMES A DAY  . traMADol (ULTRAM) 50 MG tablet Take 1 tablet (50 mg total) by mouth every 8 (eight) hours as needed.  . traMADol (ULTRAM) 50 MG tablet Take 1 tablet (50 mg total) by mouth every 8 (eight) hours as needed.  . traMADol (ULTRAM) 50 MG tablet Take 1 tablet (50 mg total) by mouth 3 (three) times daily as needed.  . zolpidem (AMBIEN) 10 MG tablet Take 1 tablet (10 mg total) by mouth at bedtime.     1. Essential hypertension  No c/o chest pain,sob or headache. Blood pressures at home vary from day to day from 120 systolic to 150 systolic  2. Gastroesophageal reflux disease without esophagitis  Takes omeprazole daily which does not seem to be  helping any  More.  3. Severe obesity (BMI >= 40) (HCC)  No weight changes  4. RLS (restless legs syndrome)  Takes mirapex nightly. Works well to keep legs still at noght  5. Primary insomnia  Takes ambien nightly  6. Mixed hyperlipidemia  Takes lipitor daily but does ot watch diet  7. Fibromyalgia  Hurts daily Pain assessment: Cause of pain- fibroomyalgiia Pain location- changes sites daily Pain on scale of 1-10- 5/10 currently Frequency- daily What increases pain- lots of walking or standing What makes pain Better-rest and pain meds Effects on ADL - some days she is not able to do anything Any change in general medical condition-none  Current medications- ultram 50mg  daily Effectiveness of current meds-helps with pain Adverse reactions form pain meds- none Morphine equivalent<10  Pill count performed-No U[place] drug screen- No Was the NCCSR reviewed- yes  If yes were their any concerning findings? - none  Pain contract signed on: 02/08/17   8. Recurrent major depressive disorder, in full remission (HCC)  Is on cymbalta and is doing well. [facility] 02/08/2017 08/07/2016 07/29/2016 05/07/2016 02/04/2016  Decreased Interest 0 0 0 0 0  Down, Depressed, Hopeless 0 0 0 0 0  PHQ - 2 Score 0 0 0 0 0  Altered sleeping - - - - -  Tired, decreased energy - - - - -  Change in appetite - - - - -  Feeling bad or failure about yourself  - - - - -  Trouble concentrating - - - - -  Moving slowly or fidgety/restless - - - - -  Suicidal thoughts - - - - -  PHQ-9 Score - - - - -  Difficult doing work/chores - - - - -       New complaints: None today  Social history: Her dog died last week that she has had for over 10 years and a friend of hers was dx with breast cancer.   Review of Systems  Constitutional: Negative for activity change and appetite change.  HENT: Negative.   Eyes: Negative for pain.  Respiratory: Negative for shortness of breath.   Cardiovascular:  Negative for chest pain, palpitations and leg swelling.  Gastrointestinal: Negative for abdominal pain.  Endocrine: Negative for polydipsia.  Genitourinary: Negative.   Musculoskeletal: Positive for myalgias.  Skin: Negative for rash.  Neurological: Negative for dizziness, weakness and headaches.  Hematological: Does not bruise/bleed easily.  Psychiatric/Behavioral: Negative.   All other systems reviewed and are negative.      Objective:   Physical Exam  Constitutional: She is oriented to person, place, and time. She appears well-developed and well-nourished.  HENT:  Nose: Nose normal.  Mouth/Throat: Oropharynx is clear and moist.  Eyes: EOM are normal.  Neck: Trachea normal, normal range of motion and full passive range of motion without pain. Neck supple. No JVD present. Carotid bruit is not present. No thyromegaly present.  Cardiovascular: Normal rate, regular rhythm, normal heart sounds and intact distal pulses.  Exam reveals no gallop and no friction rub.   No murmur heard. Pulmonary/Chest: Effort normal and breath sounds normal.  Abdominal: Soft. Bowel sounds are normal. She exhibits no distension and no mass. There is no tenderness.  Musculoskeletal: Normal range of motion.  Multiple tender points in cervical spine an dup and doen back  Lymphadenopathy:    She has no cervical adenopathy.  Neurological: She is alert and oriented to person, place, and time. She has normal reflexes.  Skin: Skin is warm and dry.  Psychiatric: She has a normal mood and affect. Her behavior is normal. Judgment and thought content normal.    BP (!) 166/94   Pulse 76   Temp (!) 97.5 F (36.4 C) (Oral)   Ht  (1.549 m)   Wt 257 lb (116.6 kg)   LMP 09/13/2012   SpO2 98%   BMI 48.56 kg/m        Assessment & Plan:  1. Essential hypertension Low sodium diet - lisinopril-hydrochlorothiazide (PRINZIDE,ZESTORETIC) 20-12.5 MG tablet; Take 2 tablets by mouth daily.  Dispense: 180 tablet;  Refill: 1  2. Gastroesophageal reflux disease without esophagitis Avoid spicy foods Do not eat 2 hours prior to bedtime - pantoprazole (PROTONIX) 40 MG tablet; Take 1 tablet (40 mg total) by mouth daily.  Dispense: 30 tablet; Refill: 3  3. Severe obesity (BMI >= 40) (HCC) Discussed diet and exercise for person with BMI >25 Will recheck weight in 3-6 months  4. RLS (restless legs syndrome) Keep legs warm at night - pramipexole (MIRAPEX) 1 MG tablet; Take 1 tablet (1 mg total) by mouth 2 (two) times daily.  Dispense: 180 tablet; Refill: 1  5. Primary insomnia Bedtime routine - zolpidem (AMBIEN) 10 MG tablet; Take 1 tablet (10 mg total) by mouth at bedtime.  Dispense: 30 tablet; Refill: 2  6. Mixed hyperlipidemia Low fta diet  encouraged - atorvastatin (LIPITOR) 40 MG tablet; Take 1 tablet (40 mg total) by mouth daily.  Dispense: 90 tablet; Refill: 1  7. Fibromyalgia Exercise when can - baclofen (LIORESAL) 10 MG tablet; Take 1 tablet (10 mg total) by mouth 3 (three) times daily.  Dispense: 90 each; Refill: 5 - gabapentin (NEURONTIN) 300 MG capsule; Take 1 capsule (300 mg total) by mouth 3 (three) times daily.  Dispense: 540 capsule; Refill: 1 - DULoxetine (CYMBALTA) 60 MG capsule; Take 1 capsule (60 mg total) by mouth daily.  Dispense: 90 capsule; Refill: 1 - traMADol (ULTRAM) 50 MG tablet; Take 1 tablet (50 mg total) by mouth every 8 (eight) hours as needed.  Dispense: 90 tablet; Refill: 0 - traMADol (ULTRAM) 50 MG tablet; Take 1 tablet (50 mg total) by mouth every 8 (eight) hours as needed.  Dispense: 90 tablet; Refill: 0 - traMADol (ULTRAM) 50 MG tablet; Take 1 tablet (50 mg total) by mouth 3 (three) times daily as needed.  Dispense: 90 tablet; Refill: 1  8. Recurrent major depressive disorder, in full remission (HCC) Stress management  9. Migraine without aura and without status migrainosus, not intractable - topiramate (TOPAMAX) 50 MG tablet; Take 1 tablet (50 mg total) by mouth  3 (three) times daily.  Dispense: 270 tablet; Refill: 1  10. Urinary urgency - fesoterodine (TOVIAZ) 8 MG TB24 tablet; Take 1 tablet (8 mg total) by mouth daily.  Dispense: 90 tablet; Refill: 1    Labs pending Health maintenance reviewed Diet and exercise encouraged Continue all meds Follow up  In 3 months   Mary-Margaret Daphine Deutscher, FNP

## 2017-02-09 LAB — CMP14+EGFR
ALBUMIN: 4.4 g/dL (ref 3.5–5.5)
ALK PHOS: 129 IU/L — AB (ref 39–117)
ALT: 38 IU/L — ABNORMAL HIGH (ref 0–32)
AST: 24 IU/L (ref 0–40)
Albumin/Globulin Ratio: 1.5 (ref 1.2–2.2)
BUN / CREAT RATIO: 13 (ref 9–23)
BUN: 13 mg/dL (ref 6–24)
Bilirubin Total: 0.4 mg/dL (ref 0.0–1.2)
CALCIUM: 9.8 mg/dL (ref 8.7–10.2)
CO2: 26 mmol/L (ref 20–29)
CREATININE: 1.03 mg/dL — AB (ref 0.57–1.00)
Chloride: 103 mmol/L (ref 96–106)
GFR calc Af Amer: 70 mL/min/{1.73_m2} (ref >59)
GFR, EST NON AFRICAN AMERICAN: 60 mL/min/{1.73_m2} (ref >59)
GLOBULIN, TOTAL: 3 g/dL (ref 1.5–4.5)
GLUCOSE: 87 mg/dL (ref 65–99)
Potassium: 4.7 mmol/L (ref 3.5–5.2)
Sodium: 144 mmol/L (ref 134–144)
Total Protein: 7.4 g/dL (ref 6.0–8.5)

## 2017-02-09 LAB — LIPID PANEL
CHOL/HDL RATIO: 3.5 ratio (ref 0.0–4.4)
CHOLESTEROL TOTAL: 234 mg/dL — AB (ref 100–199)
HDL: 66 mg/dL (ref >39)
LDL CALC: 154 mg/dL — AB (ref 0–99)
TRIGLYCERIDES: 72 mg/dL (ref 0–149)
VLDL CHOLESTEROL CAL: 14 mg/dL (ref 5–40)

## 2017-05-06 ENCOUNTER — Other Ambulatory Visit: Payer: Self-pay | Admitting: Nurse Practitioner

## 2017-05-07 NOTE — Telephone Encounter (Signed)
RXs called into [facility] notified

## 2017-05-07 NOTE — Telephone Encounter (Signed)
[facility]

## 2017-05-10 ENCOUNTER — Ambulatory Visit: Admitting: Nurse Practitioner

## 2017-05-18 ENCOUNTER — Ambulatory Visit (INDEPENDENT_AMBULATORY_CARE_PROVIDER_SITE_OTHER): Admitting: Nurse Practitioner

## 2017-05-18 ENCOUNTER — Other Ambulatory Visit: Payer: Self-pay

## 2017-05-18 ENCOUNTER — Encounter: Payer: Self-pay | Admitting: Nurse Practitioner

## 2017-05-18 VITALS — BP 132/84 | HR 73 | Temp 97.1°F | Ht 61.0 in | Wt 262.0 lb

## 2017-05-18 DIAGNOSIS — M797 Fibromyalgia: Secondary | ICD-10-CM | POA: Diagnosis not present

## 2017-05-18 DIAGNOSIS — F5101 Primary insomnia: Secondary | ICD-10-CM | POA: Diagnosis not present

## 2017-05-18 DIAGNOSIS — G43009 Migraine without aura, not intractable, without status migrainosus: Secondary | ICD-10-CM | POA: Diagnosis not present

## 2017-05-18 DIAGNOSIS — F3342 Major depressive disorder, recurrent, in full remission: Secondary | ICD-10-CM | POA: Diagnosis not present

## 2017-05-18 DIAGNOSIS — I10 Essential (primary) hypertension: Secondary | ICD-10-CM | POA: Diagnosis not present

## 2017-05-18 DIAGNOSIS — E782 Mixed hyperlipidemia: Secondary | ICD-10-CM

## 2017-05-18 DIAGNOSIS — G2581 Restless legs syndrome: Secondary | ICD-10-CM | POA: Diagnosis not present

## 2017-05-18 DIAGNOSIS — K219 Gastro-esophageal reflux disease without esophagitis: Secondary | ICD-10-CM | POA: Diagnosis not present

## 2017-05-18 MED ORDER — TRAMADOL HCL 50 MG PO TABS: 50.0000 mg | ORAL_TABLET | Freq: Three times a day (TID) | ORAL | 0 refills | Status: DC | PRN

## 2017-05-18 MED ORDER — ZOLPIDEM TARTRATE 10 MG PO TABS: 10.0000 mg | ORAL_TABLET | Freq: Every day | ORAL | 2 refills | Status: DC

## 2017-05-18 MED ORDER — PANTOPRAZOLE SODIUM 40 MG PO TBEC: 40.0000 mg | DELAYED_RELEASE_TABLET | Freq: Every day | ORAL | 1 refills | Status: DC

## 2017-05-18 NOTE — Progress Notes (Signed)
Subjective:    Patient ID: Marie Carter, female    DOB: 04-13-1961, 56 y.o.   MRN: [id number]  HPI  Marie Carter is here today for follow up of chronic medical problem.  Outpatient Encounter Medications as of 05/18/2017  Medication Sig  . atorvastatin (LIPITOR) 40 MG tablet Take 1 tablet (40 mg total) by mouth daily.  . baclofen (LIORESAL) 10 MG tablet Take 1 tablet (10 mg total) by mouth 3 (three) times daily.  . DULoxetine (CYMBALTA) 60 MG capsule Take 1 capsule (60 mg total) by mouth daily.  [name] EPINEPHrine 0.3 mg/0.3 mL IJ SOAJ injection Inject 0.3 mLs (0.3 mg total) into the muscle once.  . fesoterodine (TOVIAZ) 8 MG TB24 tablet Take 1 tablet (8 mg total) by mouth daily.  [name] gabapentin (NEURONTIN) 300 MG capsule Take 1 capsule (300 mg total) by mouth 3 (three) times daily.  [name] lisinopril-hydrochlorothiazide (PRINZIDE,ZESTORETIC) 20-12.5 MG tablet Take 2 tablets by mouth daily.  . medroxyPROGESTERone (PROVERA) 10 MG tablet Take 1 tablet (10 mg total) by mouth daily.  . pantoprazole (PROTONIX) 40 MG tablet Take 1 tablet (40 mg total) by mouth daily.  . pramipexole (MIRAPEX) 1 MG tablet Take 1 tablet (1 mg total) by mouth 2 (two) times daily.  [name] PROAIR HFA 108 (90 Base) MCG/ACT inhaler USE 2 INHALATIONS EVERY 6 HOURS AS NEEDED FOR WHEEZING  . topiramate (TOPAMAX) 50 MG tablet Take 1 tablet (50 mg total) by mouth 3 (three) times daily.  . traMADol (ULTRAM) 50 MG tablet Take 1 tablet (50 mg total) by mouth every 8 (eight) hours as needed.  . traMADol (ULTRAM) 50 MG tablet Take 1 tablet (50 mg total) by mouth every 8 (eight) hours as needed.  . traMADol (ULTRAM) 50 MG tablet Take 1 tablet (50 mg total) by mouth 3 (three) times daily as needed.  . zolpidem (AMBIEN) 10 MG tablet Take 1 tablet (10 mg total) by mouth at bedtime.     1. Essential hypertension  No c/o chest pain, SOB or headache. Does not check blood pressure at home. BP Readings from Last 3 Encounters:  02/08/17 (!) 166/94    08/07/16 (!) 136/96  07/29/16 133/87     2. Migraine without aura and without status migrainosus, not intractable  Only has about 1 a month- are retractable  3. Gastroesophageal reflux disease without esophagitis  Takes protonix daily- has symptoms if does not take  4. Mixed hyperlipidemia  Does not watch diet at all  5. Fibromyalgia  Has pain all the time- takes ultram dialy which helps with pain  6. Recurrent major depressive disorder, in full remission ([place])  Has lots of family issues - takes cymbalta which helps even though she takes this more for her fibromyalgia. Depression screen [facility] 2/9 05/18/2017 02/08/2017 08/07/2016  Decreased Interest 0 0 0  Down, Depressed, Hopeless 0 0 0  PHQ - 2 Score 0 0 0  Altered sleeping - - -  Tired, decreased energy - - -  Change in appetite - - -  Feeling bad or failure about yourself  - - -  Trouble concentrating - - -  Moving slowly or fidgety/restless - - -  Suicidal thoughts - - -  PHQ-9 Score - - -  Difficult doing work/chores - - -     7. Primary insomnia  Cannot sleep without ambien nightly  8. RLS (restless legs syndrome)  mirapex helps a lot with her constant leg movement  9. Severe obesity (BMI >= 40) (  [place])  No weight changes    New complaints: None today  Social history: Is on disability- lives with husband and daughter    Review of Systems  Constitutional: Negative for activity change and appetite change.  HENT: Negative.   Eyes: Negative for pain.  Respiratory: Negative for shortness of breath.   Cardiovascular: Positive for leg swelling. Negative for chest pain and palpitations.  Gastrointestinal: Negative for abdominal pain.  Endocrine: Negative for polydipsia.  Genitourinary: Negative.   Musculoskeletal: Positive for arthralgias, back pain and myalgias.  Skin: Negative for rash.  Neurological: Negative for dizziness, weakness and headaches.  Hematological: Does not bruise/bleed easily.   Psychiatric/Behavioral: Negative.   All other systems reviewed and are negative.      Objective:   Physical Exam  Constitutional: She is oriented to person, place, and time. She appears well-developed and well-nourished.  HENT:  Nose: Nose normal.  Mouth/Throat: Oropharynx is clear and moist.  Eyes: EOM are normal.  Neck: Trachea normal, normal range of motion and full passive range of motion without pain. Neck supple. No JVD present. Carotid bruit is not present. No thyromegaly present.  Cardiovascular: Normal rate, regular rhythm, normal heart sounds and intact distal pulses. Exam reveals no gallop and no friction rub.  No murmur heard. Pulmonary/Chest: Effort normal and breath sounds normal.  Abdominal: Soft. Bowel sounds are normal. She exhibits no distension and no mass. There is no tenderness.  Musculoskeletal: Normal range of motion.  Walking with walkermyalgia all over- multiple tender ares up and down spine bil  Lymphadenopathy:    She has no cervical adenopathy.  Neurological: She is alert and oriented to person, place, and time. She has normal reflexes.  Skin: Skin is warm and dry.  Psychiatric: She has a normal mood and affect. Her behavior is normal. Judgment and thought content normal.   BP 132/84   Pulse 73   Temp (!) 97.1 F (36.2 C) (Oral)   Ht 5' 1"  (1.549 m)   Wt 262 lb (118.8 kg)   LMP 09/13/2012   BMI 49.50 kg/m        Assessment & Plan:  1. Essential hypertension Low sodium diet - CMP14+EGFR  2. Migraine without aura and without status migrainosus, not intractable Avoid caffeine  3. Gastroesophageal reflux disease without esophagitis Avoid spicy foods Do not eat 2 hours prior to bedtime - pantoprazole (PROTONIX) 40 MG tablet; Take 1 tablet (40 mg total) by mouth daily.  Dispense: 90 tablet; Refill: 1  4. Mixed hyperlipidemia Low fat diet - Lipid panel  5. Fibromyalgia Exercise to keeps muscles warm  6. Recurrent major depressive  disorder, in full remission [facility]) Stress management  7. Primary insomnia Bedtime routine  8. RLS (restless legs syndrome) Keep legs warm at  night  9. Severe obesity (BMI >= 40) (HCC) Discussed diet and exercise for person with BMI >25 Will recheck weight in 3-6 months  Meds ordered this encounter  Medications  . pantoprazole (PROTONIX) 40 MG tablet    Sig: Take 1 tablet (40 mg total) by mouth daily.    Dispense:  90 tablet    Refill:  1    Order Specific Question:   Supervising Provider    Answer:   VINCENT, CAROL L [[id number]]  . traMADol (ULTRAM) 50 MG tablet    Sig: Take 1 tablet (50 mg total) by mouth every 8 (eight) hours as needed.    Dispense:  90 tablet    Refill:  0  DO NOT FILL TILL 07/17/17    Order Specific Question:   Supervising Provider    Answer:   Eustaquio Maize [[id number]]  . traMADol (ULTRAM) 50 MG tablet    Sig: Take 1 tablet (50 mg total) by mouth every 8 (eight) hours as needed.    Dispense:  90 tablet    Refill:  0    DO NOT FILL TILL 06/17/17    Order Specific Question:   Supervising Provider    Answer:   Assunta Found L [[id number]]  . traMADol (ULTRAM) 50 MG tablet    Sig: Take 1 tablet (50 mg total) by mouth 3 (three) times daily as needed.    Dispense:  90 tablet    Refill:  0    Order Specific Question:   Supervising Provider    Answer:   [place] pending Health maintenance reviewed Diet and exercise encouraged Continue all meds Follow up  In [place], [place]

## 2017-05-19 LAB — CMP14+EGFR
A/G RATIO: 1.5 (ref 1.2–2.2)
ALT: 29 IU/L (ref 0–32)
AST: 19 IU/L (ref 0–40)
Albumin: 4.3 g/dL (ref 3.5–5.5)
Alkaline Phosphatase: 127 IU/L — ABNORMAL HIGH (ref 39–117)
BILIRUBIN TOTAL: 0.2 mg/dL (ref 0.0–1.2)
BUN/Creatinine Ratio: 17 (ref 9–23)
BUN: 18 mg/dL (ref 6–24)
CALCIUM: 8.9 mg/dL (ref 8.7–10.2)
CHLORIDE: 105 mmol/L (ref 96–106)
CO2: 25 mmol/L (ref 20–29)
Creatinine, Ser: 1.08 mg/dL — ABNORMAL HIGH (ref 0.57–1.00)
GFR calc Af Amer: 65 mL/min/{1.73_m2} (ref >59)
GFR, EST NON AFRICAN AMERICAN: 57 mL/min/{1.73_m2} — AB (ref >59)
GLOBULIN, TOTAL: 2.8 g/dL (ref 1.5–4.5)
Glucose: 86 mg/dL (ref 65–99)
POTASSIUM: 4.3 mmol/L (ref 3.5–5.2)
SODIUM: 145 mmol/L — AB (ref 134–144)
Total Protein: 7.1 g/dL (ref 6.0–8.5)

## 2017-05-19 LAB — LIPID PANEL
CHOL/HDL RATIO: 4.4 ratio (ref 0.0–4.4)
Cholesterol, Total: 231 mg/dL — ABNORMAL HIGH (ref 100–199)
HDL: 53 mg/dL (ref >39)
LDL Calculated: 156 mg/dL — ABNORMAL HIGH (ref 0–99)
TRIGLYCERIDES: 110 mg/dL (ref 0–149)
VLDL Cholesterol Cal: 22 mg/dL (ref 5–40)

## 2017-06-08 ENCOUNTER — Other Ambulatory Visit: Payer: Self-pay | Admitting: Nurse Practitioner

## 2017-08-17 ENCOUNTER — Encounter: Payer: Self-pay | Admitting: Nurse Practitioner

## 2017-08-17 ENCOUNTER — Ambulatory Visit (INDEPENDENT_AMBULATORY_CARE_PROVIDER_SITE_OTHER): Admitting: Nurse Practitioner

## 2017-08-17 VITALS — BP 143/89 | HR 79 | Temp 97.0°F | Ht 61.0 in | Wt 264.0 lb

## 2017-08-17 DIAGNOSIS — G2581 Restless legs syndrome: Secondary | ICD-10-CM

## 2017-08-17 DIAGNOSIS — M797 Fibromyalgia: Secondary | ICD-10-CM | POA: Diagnosis not present

## 2017-08-17 DIAGNOSIS — R3915 Urgency of urination: Secondary | ICD-10-CM

## 2017-08-17 DIAGNOSIS — I10 Essential (primary) hypertension: Secondary | ICD-10-CM

## 2017-08-17 DIAGNOSIS — F3342 Major depressive disorder, recurrent, in full remission: Secondary | ICD-10-CM | POA: Diagnosis not present

## 2017-08-17 DIAGNOSIS — F5101 Primary insomnia: Secondary | ICD-10-CM

## 2017-08-17 DIAGNOSIS — K219 Gastro-esophageal reflux disease without esophagitis: Secondary | ICD-10-CM

## 2017-08-17 DIAGNOSIS — E782 Mixed hyperlipidemia: Secondary | ICD-10-CM

## 2017-08-17 MED ORDER — PRAMIPEXOLE DIHYDROCHLORIDE 1 MG PO TABS: 1.0000 mg | ORAL_TABLET | Freq: Two times a day (BID) | ORAL | 1 refills | Status: DC

## 2017-08-17 MED ORDER — BACLOFEN 10 MG PO TABS
10.0000 mg | ORAL_TABLET | Freq: Three times a day (TID) | ORAL | 5 refills | Status: DC
Start: 2017-08-17 — End: 2017-11-25

## 2017-08-17 MED ORDER — TRAMADOL HCL 50 MG PO TABS: 50.0000 mg | ORAL_TABLET | Freq: Three times a day (TID) | ORAL | 0 refills | Status: DC | PRN

## 2017-08-17 MED ORDER — ZOLPIDEM TARTRATE 10 MG PO TABS: 10.0000 mg | ORAL_TABLET | Freq: Every day | ORAL | 2 refills | Status: DC

## 2017-08-17 MED ORDER — FESOTERODINE FUMARATE ER 8 MG PO TB24: 8.0000 mg | ORAL_TABLET | Freq: Every day | ORAL | 1 refills | Status: DC

## 2017-08-17 MED ORDER — ATORVASTATIN CALCIUM 40 MG PO TABS: 40.0000 mg | ORAL_TABLET | Freq: Every day | ORAL | 1 refills | Status: DC

## 2017-08-17 MED ORDER — DULOXETINE HCL 60 MG PO CPEP: 60.0000 mg | ORAL_CAPSULE | Freq: Every day | ORAL | 1 refills | Status: DC

## 2017-08-17 MED ORDER — GABAPENTIN 300 MG PO CAPS: 300.0000 mg | ORAL_CAPSULE | Freq: Three times a day (TID) | ORAL | 1 refills | Status: DC

## 2017-08-17 MED ORDER — PANTOPRAZOLE SODIUM 40 MG PO TBEC: 40.0000 mg | DELAYED_RELEASE_TABLET | Freq: Every day | ORAL | 1 refills | Status: DC

## 2017-08-17 NOTE — Patient Instructions (Signed)

## 2017-08-17 NOTE — Progress Notes (Signed)
Subjective:    Patient ID: Marie Carter, female    DOB: [birth year], 59 y.o.   MRN: [id number]  HPI Marie Carter is here today for follow up of chronic medical problem.  Outpatient Encounter Medications as of 08/17/2017  Medication Sig  . atorvastatin (LIPITOR) 40 MG tablet Take 1 tablet (40 mg total) by mouth daily.  . baclofen (LIORESAL) 10 MG tablet Take 1 tablet (10 mg total) by mouth 3 (three) times daily.  . DULoxetine (CYMBALTA) 60 MG capsule Take 1 capsule (60 mg total) by mouth daily.  [name] EPINEPHrine 0.3 mg/0.3 mL IJ SOAJ injection Inject 0.3 mLs (0.3 mg total) into the muscle once.  . fesoterodine (TOVIAZ) 8 MG TB24 tablet Take 1 tablet (8 mg total) by mouth daily.  [name] gabapentin (NEURONTIN) 300 MG capsule Take 1 capsule (300 mg total) by mouth 3 (three) times daily.  [name] lisinopril-hydrochlorothiazide (PRINZIDE,ZESTORETIC) 20-12.5 MG tablet Take 2 tablets by mouth daily.  . medroxyPROGESTERone (PROVERA) 10 MG tablet Take 1 tablet (10 mg total) by mouth daily.  . pantoprazole (PROTONIX) 40 MG tablet Take 1 tablet (40 mg total) by mouth daily.  . pramipexole (MIRAPEX) 1 MG tablet Take 1 tablet (1 mg total) by mouth 2 (two) times daily.  [name] PROAIR HFA 108 (90 Base) MCG/ACT inhaler USE 2 INHALATIONS EVERY 6 HOURS AS NEEDED FOR WHEEZING  . topiramate (TOPAMAX) 50 MG tablet Take 1 tablet (50 mg total) by mouth 3 (three) times daily.  . traMADol (ULTRAM) 50 MG tablet Take 1 tablet (50 mg total) by mouth every 8 (eight) hours as needed.  . traMADol (ULTRAM) 50 MG tablet Take 1 tablet (50 mg total) by mouth every 8 (eight) hours as needed.  . traMADol (ULTRAM) 50 MG tablet Take 1 tablet (50 mg total) by mouth 3 (three) times daily as needed.  . zolpidem (AMBIEN) 10 MG tablet Take 1 tablet (10 mg total) by mouth at bedtime.    1. Essential hypertension  No c/o chest pain, sob or headache. Does not check blood pressure at home. BP Readings from Last 3 Encounters:  08/17/17 (!) 143/89    05/18/17 132/84  02/08/17 (!) 166/94     2. Gastroesophageal reflux disease without esophagitis  Takes protonix daily to keep symptoms under control  3. Recurrent major depressive disorder, in full remission (HCC)  Takes cymbalta for firbomyalgia which also helps with her depression. Depression screen [facility] 2/9 08/17/2017 05/18/2017 02/08/2017  Decreased Interest 0 0 0  Down, Depressed, Hopeless 0 0 0  PHQ - 2 Score 0 0 0  Altered sleeping - - -  Tired, decreased energy - - -  Change in appetite - - -  Feeling bad or failure about yourself  - - -  Trouble concentrating - - -  Moving slowly or fidgety/restless - - -  Suicidal thoughts - - -  PHQ-9 Score - - -  Difficult doing work/chores - - -     4. Fibromyalgia  Hurts every day. Ultram seems to work the best for her. Brings pain rate down to 2/10  5. Mixed hyperlipidemia  Does not watch diet  6. Primary insomnia  Cannot sleep well without her ambien  7. RLS (restless legs syndrome)  mirapex works well to keep her legs calm at night  8. Severe obesity (BMI >= 40) (HCC)  No recent weight changes    New complaints: None today  Social history: Lives with husband- is raising hr grandaughter - has has her  since she was a baby   Review of Systems  Constitutional: Negative for activity change and appetite change.  HENT: Negative.   Eyes: Negative for pain.  Respiratory: Negative for shortness of breath.   Cardiovascular: Negative for chest pain, palpitations and leg swelling.  Gastrointestinal: Negative for abdominal pain.  Endocrine: Negative for polydipsia.  Genitourinary: Negative.   Skin: Negative for rash.  Neurological: Negative for dizziness, weakness and headaches.  Hematological: Does not bruise/bleed easily.  Psychiatric/Behavioral: Negative.   All other systems reviewed and are negative.      Objective:   Physical Exam  Constitutional: She is oriented to person, place, and time. She appears  well-developed and well-nourished.  HENT:  Nose: Nose normal.  Mouth/Throat: Oropharynx is clear and moist.  Eyes: EOM are normal.  Neck: Trachea normal, normal range of motion and full passive range of motion without pain. Neck supple. No JVD present. Carotid bruit is not present. No thyromegaly present.  Cardiovascular: Normal rate, regular rhythm, normal heart sounds and intact distal pulses. Exam reveals no gallop and no friction rub.  No murmur heard. Pulmonary/Chest: Effort normal and breath sounds normal.  Abdominal: Soft. Bowel sounds are normal. She exhibits no distension and no mass. There is no tenderness.  Musculoskeletal: Normal range of motion.  Lymphadenopathy:    She has no cervical adenopathy.  Neurological: She is alert and oriented to person, place, and time. She has normal reflexes.  Skin: Skin is warm and dry.  Psychiatric: She has a normal mood and affect. Her behavior is normal. Judgment and thought content normal.    BP (!) 143/89   Pulse 79   Temp (!) 97 F (36.1 C) (Oral)   Ht 5\' 1"  (1.549 m)   Wt 264 lb (119.7 kg)   LMP 09/13/2012   BMI 49.88 kg/m        Assessment & Plan:  1. Essential hypertension Low sodium diet  2. Gastroesophageal reflux disease without esophagitis Avoid spicy foods Do not eat 2 hours prior to bedtime - pantoprazole (PROTONIX) 40 MG tablet; Take 1 tablet (40 mg total) by mouth daily.  Dispense: 90 tablet; Refill: 1  3. Recurrent major depressive disorder, in full remission (HCC) Stress management  4. Fibromyalgia Exercise daily to keep muscles warm - traMADol (ULTRAM) 50 MG tablet; Take 1 tablet (50 mg total) by mouth every 8 (eight) hours as needed.  Dispense: 90 tablet; Refill: 0 - traMADol (ULTRAM) 50 MG tablet; Take 1 tablet (50 mg total) by mouth every 8 (eight) hours as needed.  Dispense: 90 tablet; Refill: 0 - traMADol (ULTRAM) 50 MG tablet; Take 1 tablet (50 mg total) by mouth 3 (three) times daily as needed.   Dispense: 90 tablet; Refill: 0 - DULoxetine (CYMBALTA) 60 MG capsule; Take 1 capsule (60 mg total) by mouth daily.  Dispense: 90 capsule; Refill: 1 - gabapentin (NEURONTIN) 300 MG capsule; Take 1 capsule (300 mg total) by mouth 3 (three) times daily.  Dispense: 540 capsule; Refill: 1 - baclofen (LIORESAL) 10 MG tablet; Take 1 tablet (10 mg total) by mouth 3 (three) times daily.  Dispense: 90 each; Refill: 5  5. Mixed hyperlipidemia Low fat diet - atorvastatin (LIPITOR) 40 MG tablet; Take 1 tablet (40 mg total) by mouth daily.  Dispense: 90 tablet; Refill: 1  6. Primary insomnia Bedtime routine - zolpidem (AMBIEN) 10 MG tablet; Take 1 tablet (10 mg total) by mouth at bedtime.  Dispense: 30 tablet; Refill: 2  7. RLS (restless legs  syndrome) - pramipexole (MIRAPEX) 1 MG tablet; Take 1 tablet (1 mg total) by mouth 2 (two) times daily.  Dispense: 180 tablet; Refill: 1  8. Severe obesity (BMI >= 40) (HCC) Discussed diet and exercise for person with BMI >25 Will recheck weight in 3-6 months  9. Urinary urgency - fesoterodine (TOVIAZ) 8 MG TB24 tablet; Take 1 tablet (8 mg total) by mouth daily.  Dispense: 90 tablet; Refill: 1    Labs pending Health maintenance reviewed Diet and exercise encouraged Continue all meds Follow up  In 3 months   Mary-Margaret Daphine DeutscherMartin, FNP

## 2017-08-17 NOTE — Addendum Note (Signed)
Addended by: Cleda DaubUCKER, AMANDA G on: 08/17/2017 12:52 PM   Modules accepted: Orders

## 2017-08-18 LAB — CMP14+EGFR
A/G RATIO: 1.5 (ref 1.2–2.2)
ALT: 24 IU/L (ref 0–32)
AST: 17 IU/L (ref 0–40)
Albumin: 4.1 g/dL (ref 3.5–5.5)
Alkaline Phosphatase: 129 IU/L — ABNORMAL HIGH (ref 39–117)
BUN/Creatinine Ratio: 13 (ref 9–23)
BUN: 15 mg/dL (ref 6–24)
Bilirubin Total: <0.2 mg/dL (ref 0.0–1.2)
CALCIUM: 8.8 mg/dL (ref 8.7–10.2)
CHLORIDE: 107 mmol/L — AB (ref 96–106)
CO2: 26 mmol/L (ref 20–29)
Creatinine, Ser: 1.13 mg/dL — ABNORMAL HIGH (ref 0.57–1.00)
GFR, EST AFRICAN AMERICAN: 62 mL/min/{1.73_m2} (ref >59)
GFR, EST NON AFRICAN AMERICAN: 54 mL/min/{1.73_m2} — AB (ref >59)
GLUCOSE: 84 mg/dL (ref 65–99)
Globulin, Total: 2.8 g/dL (ref 1.5–4.5)
POTASSIUM: 4.7 mmol/L (ref 3.5–5.2)
Sodium: 147 mmol/L — ABNORMAL HIGH (ref 134–144)
TOTAL PROTEIN: 6.9 g/dL (ref 6.0–8.5)

## 2017-08-18 LAB — LIPID PANEL
CHOL/HDL RATIO: 4.1 ratio (ref 0.0–4.4)
Cholesterol, Total: 217 mg/dL — ABNORMAL HIGH (ref 100–199)
HDL: 53 mg/dL (ref >39)
LDL Calculated: 142 mg/dL — ABNORMAL HIGH (ref 0–99)
TRIGLYCERIDES: 112 mg/dL (ref 0–149)
VLDL CHOLESTEROL CAL: 22 mg/dL (ref 5–40)

## 2017-10-21 ENCOUNTER — Telehealth: Payer: Self-pay | Admitting: Nurse Practitioner

## 2017-10-21 DIAGNOSIS — Z1239 Encounter for other screening for malignant neoplasm of breast: Secondary | ICD-10-CM

## 2017-10-21 NOTE — Telephone Encounter (Signed)
[facility]

## 2017-11-12 ENCOUNTER — Ambulatory Visit
Admission: RE | Admit: 2017-11-12 | Discharge: 2017-11-12 | Disposition: A | Discharge status: Home / Self Care | Source: Ambulatory Visit | Attending: Nurse Practitioner | Admitting: Nurse Practitioner

## 2017-11-12 DIAGNOSIS — Z1239 Encounter for other screening for malignant neoplasm of breast: Secondary | ICD-10-CM

## 2017-11-18 ENCOUNTER — Ambulatory Visit: Admitting: Nurse Practitioner

## 2017-11-22 ENCOUNTER — Ambulatory Visit: Admitting: Nurse Practitioner

## 2017-11-24 ENCOUNTER — Telehealth: Payer: Self-pay | Admitting: Nurse Practitioner

## 2017-11-24 NOTE — Telephone Encounter (Signed)
Appt made

## 2017-11-25 ENCOUNTER — Ambulatory Visit (INDEPENDENT_AMBULATORY_CARE_PROVIDER_SITE_OTHER): Admitting: Nurse Practitioner

## 2017-11-25 ENCOUNTER — Encounter: Payer: Self-pay | Admitting: Nurse Practitioner

## 2017-11-25 VITALS — BP 142/86 | HR 89 | Temp 98.0°F | Ht 61.0 in | Wt 267.0 lb

## 2017-11-25 DIAGNOSIS — J4520 Mild intermittent asthma, uncomplicated: Secondary | ICD-10-CM

## 2017-11-25 DIAGNOSIS — G43009 Migraine without aura, not intractable, without status migrainosus: Secondary | ICD-10-CM

## 2017-11-25 DIAGNOSIS — F5101 Primary insomnia: Secondary | ICD-10-CM | POA: Diagnosis not present

## 2017-11-25 DIAGNOSIS — K219 Gastro-esophageal reflux disease without esophagitis: Secondary | ICD-10-CM | POA: Diagnosis not present

## 2017-11-25 DIAGNOSIS — R3915 Urgency of urination: Secondary | ICD-10-CM

## 2017-11-25 DIAGNOSIS — F3342 Major depressive disorder, recurrent, in full remission: Secondary | ICD-10-CM

## 2017-11-25 DIAGNOSIS — I10 Essential (primary) hypertension: Secondary | ICD-10-CM | POA: Diagnosis not present

## 2017-11-25 DIAGNOSIS — M797 Fibromyalgia: Secondary | ICD-10-CM | POA: Diagnosis not present

## 2017-11-25 DIAGNOSIS — G2581 Restless legs syndrome: Secondary | ICD-10-CM | POA: Diagnosis not present

## 2017-11-25 DIAGNOSIS — E782 Mixed hyperlipidemia: Secondary | ICD-10-CM

## 2017-11-25 MED ORDER — PANTOPRAZOLE SODIUM 40 MG PO TBEC: 40.0000 mg | DELAYED_RELEASE_TABLET | Freq: Every day | ORAL | 1 refills | Status: DC

## 2017-11-25 MED ORDER — TRAMADOL HCL 50 MG PO TABS
50.0000 mg | ORAL_TABLET | Freq: Three times a day (TID) | ORAL | 0 refills | Status: DC | PRN
Start: 2017-11-25 — End: 2017-12-25

## 2017-11-25 MED ORDER — GABAPENTIN 300 MG PO CAPS: 300.0000 mg | ORAL_CAPSULE | Freq: Three times a day (TID) | ORAL | 1 refills | Status: DC

## 2017-11-25 MED ORDER — ATORVASTATIN CALCIUM 40 MG PO TABS: 40.0000 mg | ORAL_TABLET | Freq: Every day | ORAL | 1 refills | Status: DC

## 2017-11-25 MED ORDER — PRAMIPEXOLE DIHYDROCHLORIDE 1 MG PO TABS: 1.0000 mg | ORAL_TABLET | Freq: Two times a day (BID) | ORAL | 1 refills | Status: DC

## 2017-11-25 MED ORDER — FESOTERODINE FUMARATE ER 8 MG PO TB24: 8.0000 mg | ORAL_TABLET | Freq: Every day | ORAL | 1 refills | Status: DC

## 2017-11-25 MED ORDER — DULOXETINE HCL 60 MG PO CPEP: 60.0000 mg | ORAL_CAPSULE | Freq: Every day | ORAL | 1 refills | Status: DC

## 2017-11-25 MED ORDER — LISINOPRIL-HYDROCHLOROTHIAZIDE 20-12.5 MG PO TABS: 2.0000 | ORAL_TABLET | Freq: Every day | ORAL | 1 refills | Status: DC

## 2017-11-25 MED ORDER — TRAMADOL HCL 50 MG PO TABS
50.0000 mg | ORAL_TABLET | Freq: Three times a day (TID) | ORAL | 0 refills | Status: DC | PRN
Start: 2017-12-25 — End: 2018-01-24

## 2017-11-25 MED ORDER — ZOLPIDEM TARTRATE 10 MG PO TABS: 10.0000 mg | ORAL_TABLET | Freq: Every day | ORAL | 2 refills | Status: DC

## 2017-11-25 MED ORDER — TOPIRAMATE 50 MG PO TABS: 50.0000 mg | ORAL_TABLET | Freq: Three times a day (TID) | ORAL | 1 refills | Status: DC

## 2017-11-25 MED ORDER — TRAMADOL HCL 50 MG PO TABS: 50.0000 mg | ORAL_TABLET | Freq: Three times a day (TID) | ORAL | 0 refills | Status: DC | PRN

## 2017-11-25 MED ORDER — BACLOFEN 10 MG PO TABS
10.0000 mg | ORAL_TABLET | Freq: Three times a day (TID) | ORAL | 5 refills | Status: DC
Start: 2017-11-25 — End: 2018-06-02

## 2017-11-25 NOTE — Patient Instructions (Signed)

## 2017-11-25 NOTE — Progress Notes (Signed)
Subjective:    Patient ID: Marie Carter, female    DOB: February 07, 1961, 57 y.o.   MRN: [id number]   Chief Complaint: No chief complaint on file.   HPI:  1. Migraine without aura and without status migrainosus, not intractable Is on daily topamax.   2. Essential hypertension  No c/o chest pain, sob or headache. Does not check blood pressure at home. BP Readings from Last 3 Encounters:  08/17/17 (!) 143/89  05/18/17 132/84  02/08/17 (!) 166/94     3. Mild intermittent chronic asthma without complication  She is doing okay right now. She does not go outside much during summer.  4. Gastroesophageal reflux disease without esophagitis  Takes protnix daily- says that symptoms are bad when she does not take meds.  5. Severe obesity (BMI >= 40) (HCC)  No recent weight changes  6. RLS (restless legs syndrome)  Legs want to move constantly. Is always worse at night. She is on mirapex 2x a day andthat really helps.  7. Primary insomnia  She is on ambien at night to sleep and says that she cannot sleep if she does not take.  8. Mixed hyperlipidemia  Does not really watch diet and does no exercise.  9. Fibromyalgia  Pain assessment: Cause of pain- fibromyalgia Pain location- varying areas Pain on scale of 1-10- 4-5/10 Frequency- daily What increases pain-standing for long periods of time What makes pain Better-pain meds help Effects on ADL - some days she cannot do anything Any change in general medical condition-none  Current medications- ultram Effectiveness of current meds-helps but never completely relieves pain Adverse reactions form pain meds- none Morphine equivalent 10  Pill count performed-No Urine drug screen- No Was the [place] reviewed- yes  If yes were their any concerning findings? - none  Pain contract signed on:   10. Recurrent major depressive disorder, in full remission ([place])  She is currently not taking anything for depression. Depression screen [facility] 2/9  11/25/2017 08/17/2017 05/18/2017  Decreased Interest 0 0 0  Down, Depressed, Hopeless 0 0 0  PHQ - 2 Score 0 0 0  Altered sleeping - - -  Tired, decreased energy - - -  Change in appetite - - -  Feeling bad or failure about yourself  - - -  Trouble concentrating - - -  Moving slowly or fidgety/restless - - -  Suicidal thoughts - - -  PHQ-9 Score - - -  Difficult doing work/chores - - -       Outpatient Encounter Medications as of 11/25/2017  Medication Sig  . atorvastatin (LIPITOR) 40 MG tablet Take 1 tablet (40 mg total) by mouth daily.  . baclofen (LIORESAL) 10 MG tablet Take 1 tablet (10 mg total) by mouth 3 (three) times daily.  . DULoxetine (CYMBALTA) 60 MG capsule Take 1 capsule (60 mg total) by mouth daily.  [name] EPINEPHrine 0.3 mg/0.3 mL IJ SOAJ injection Inject 0.3 mLs (0.3 mg total) into the muscle once.  . fesoterodine (TOVIAZ) 8 MG TB24 tablet Take 1 tablet (8 mg total) by mouth daily.  [name] gabapentin (NEURONTIN) 300 MG capsule Take 1 capsule (300 mg total) by mouth 3 (three) times daily.  [name] lisinopril-hydrochlorothiazide (PRINZIDE,ZESTORETIC) 20-12.5 MG tablet Take 2 tablets by mouth daily.  . medroxyPROGESTERone (PROVERA) 10 MG tablet Take 1 tablet (10 mg total) by mouth daily.  . pantoprazole (PROTONIX) 40 MG tablet Take 1 tablet (40 mg total) by mouth daily.  . pramipexole (MIRAPEX) 1 MG tablet Take  1 tablet (1 mg total) by mouth 2 (two) times daily.  [name] PROAIR HFA 108 (90 Base) MCG/ACT inhaler USE 2 INHALATIONS EVERY 6 HOURS AS NEEDED FOR WHEEZING  . topiramate (TOPAMAX) 50 MG tablet Take 1 tablet (50 mg total) by mouth 3 (three) times daily.  . traMADol (ULTRAM) 50 MG tablet Take 1 tablet (50 mg total) by mouth every 8 (eight) hours as needed.  . traMADol (ULTRAM) 50 MG tablet Take 1 tablet (50 mg total) by mouth every 8 (eight) hours as needed.  . traMADol (ULTRAM) 50 MG tablet Take 1 tablet (50 mg total) by mouth 3 (three) times daily as needed.  . zolpidem (AMBIEN) 10 MG  tablet Take 1 tablet (10 mg total) by mouth at bedtime.       New complaints: *none today  Social history: Granddaughter was killed in a skiing accident in April and she is still having a hard time dealing with this.   Review of Systems  Constitutional: Negative for activity change and appetite change.  HENT: Negative.   Eyes: Negative for pain.  Respiratory: Negative for shortness of breath.   Cardiovascular: Negative for chest pain, palpitations and leg swelling.  Gastrointestinal: Negative for abdominal pain.  Endocrine: Negative for polydipsia.  Genitourinary: Negative.   Skin: Negative for rash.  Neurological: Negative for dizziness, weakness and headaches.  Hematological: Does not bruise/bleed easily.  Psychiatric/Behavioral: Negative.   All other systems reviewed and are negative.      Objective:   Physical Exam  Constitutional: She is oriented to person, place, and time. She appears well-developed and well-nourished. No distress.  HENT:  Head: Normocephalic.  Nose: Nose normal.  Mouth/Throat: Oropharynx is clear and moist.  Eyes: Pupils are equal, round, and reactive to light. EOM are normal.  Neck: Normal range of motion. Neck supple. No JVD present. Carotid bruit is not present.  Cardiovascular: Normal rate, regular rhythm, normal heart sounds and intact distal pulses.  Pulmonary/Chest: Effort normal and breath sounds normal. No respiratory distress. She has no wheezes. She has no rales. She exhibits no tenderness.  Abdominal: Soft. Normal appearance, normal aorta and bowel sounds are normal. She exhibits no distension, no abdominal bruit, no pulsatile midline mass and no mass. There is no splenomegaly or hepatomegaly. There is no tenderness.  Musculoskeletal: Normal range of motion. She exhibits no edema.  Lymphadenopathy:    She has no cervical adenopathy.  Neurological: She is alert and oriented to person, place, and time. She has normal reflexes.  Skin:  Skin is warm and dry.  Psychiatric: Judgment normal. Her affect is angry.  Nursing note and vitals reviewed.  BP (!) 142/86   Pulse 89   Temp 98 F (36.7 C) (Oral)   Ht 5' 1"  (1.549 m)   Wt 267 lb (121.1 kg)   LMP 09/13/2012   BMI 50.45 kg/m         Assessment & Plan:  Marie Carter comes in today with chief complaint of Medical Management of Chronic Issues   Diagnosis and orders addressed:  1. Migraine without aura and without status migrainosus, not intractable Avoid caffeine and other migraine triggers - topiramate (TOPAMAX) 50 MG tablet; Take 1 tablet (50 mg total) by mouth 3 (three) times daily.  Dispense: 270 tablet; Refill: 1  2. Essential hypertension Low sodium diet - lisinopril-hydrochlorothiazide (PRINZIDE,ZESTORETIC) 20-12.5 MG tablet; Take 2 tablets by mouth daily.  Dispense: 180 tablet; Refill: 1 - CMP14+EGFR  3. Mild intermittent chronic asthma without complication  4. Gastroesophageal reflux disease without esophagitis Avoid spicy foods Do not eat 2 hours prior to bedtime - pantoprazole (PROTONIX) 40 MG tablet; Take 1 tablet (40 mg total) by mouth daily.  Dispense: 90 tablet; Refill: 1  5. Severe obesity (BMI >= 40) (HCC) Discussed diet and exercise for person with BMI >25 Will recheck weight in 3-6 months  6. RLS (restless legs syndrome) Keep legs warm at night - pramipexole (MIRAPEX) 1 MG tablet; Take 1 tablet (1 mg total) by mouth 2 (two) times daily.  Dispense: 180 tablet; Refill: 1  7. Primary insomnia Bedtime routine - zolpidem (AMBIEN) 10 MG tablet; Take 1 tablet (10 mg total) by mouth at bedtime.  Dispense: 30 tablet; Refill: 2  8. Mixed hyperlipidemia Low fat diet - atorvastatin (LIPITOR) 40 MG tablet; Take 1 tablet (40 mg total) by mouth daily.  Dispense: 90 tablet; Refill: 1 - Lipid panel  9. Fibromyalgia Exercise as much as possible - DULoxetine (CYMBALTA) 60 MG capsule; Take 1 capsule (60 mg total) by mouth daily.  Dispense: 90  capsule; Refill: 1 - traMADol (ULTRAM) 50 MG tablet; Take 1 tablet (50 mg total) by mouth every 8 (eight) hours as needed.  Dispense: 90 tablet; Refill: 0 - traMADol (ULTRAM) 50 MG tablet; Take 1 tablet (50 mg total) by mouth every 8 (eight) hours as needed.  Dispense: 90 tablet; Refill: 0 - traMADol (ULTRAM) 50 MG tablet; Take 1 tablet (50 mg total) by mouth 3 (three) times daily as needed.  Dispense: 90 tablet; Refill: 0 - gabapentin (NEURONTIN) 300 MG capsule; Take 1 capsule (300 mg total) by mouth 3 (three) times daily.  Dispense: 540 capsule; Refill: 1 - baclofen (LIORESAL) 10 MG tablet; Take 1 tablet (10 mg total) by mouth 3 (three) times daily.  Dispense: 90 each; Refill: 5  10. Recurrent major depressive disorder, in full remission ([place]) Grief counseling encourgaed  11. Urinary urgency - fesoterodine (TOVIAZ) 8 MG TB24 tablet; Take 1 tablet (8 mg total) by mouth daily.  Dispense: 90 tablet; Refill: 1   Labs pending Health Maintenance reviewed Diet and exercise encouraged  Follow up plan: 3 months   Mary-Margaret Hassell Done, FNP

## 2017-11-26 LAB — CMP14+EGFR
A/G RATIO: 1.3 (ref 1.2–2.2)
ALBUMIN: 4.3 g/dL (ref 3.5–5.5)
ALT: 18 IU/L (ref 0–32)
AST: 14 IU/L (ref 0–40)
Alkaline Phosphatase: 119 IU/L — ABNORMAL HIGH (ref 39–117)
BUN / CREAT RATIO: 12 (ref 9–23)
BUN: 13 mg/dL (ref 6–24)
Bilirubin Total: 0.3 mg/dL (ref 0.0–1.2)
CALCIUM: 9.4 mg/dL (ref 8.7–10.2)
CO2: 24 mmol/L (ref 20–29)
Chloride: 104 mmol/L (ref 96–106)
Creatinine, Ser: 1.07 mg/dL — ABNORMAL HIGH (ref 0.57–1.00)
GFR, EST AFRICAN AMERICAN: 66 mL/min/{1.73_m2} (ref >59)
GFR, EST NON AFRICAN AMERICAN: 57 mL/min/{1.73_m2} — AB (ref >59)
Globulin, Total: 3.2 g/dL (ref 1.5–4.5)
Glucose: 99 mg/dL (ref 65–99)
POTASSIUM: 4.8 mmol/L (ref 3.5–5.2)
Sodium: 144 mmol/L (ref 134–144)
Total Protein: 7.5 g/dL (ref 6.0–8.5)

## 2017-11-26 LAB — LIPID PANEL
Chol/HDL Ratio: 3.8 ratio (ref 0.0–4.4)
Cholesterol, Total: 220 mg/dL — ABNORMAL HIGH (ref 100–199)
HDL: 58 mg/dL (ref >39)
LDL Calculated: 147 mg/dL — ABNORMAL HIGH (ref 0–99)
Triglycerides: 76 mg/dL (ref 0–149)
VLDL Cholesterol Cal: 15 mg/dL (ref 5–40)

## 2018-02-28 ENCOUNTER — Ambulatory Visit (INDEPENDENT_AMBULATORY_CARE_PROVIDER_SITE_OTHER): Admitting: Nurse Practitioner

## 2018-02-28 ENCOUNTER — Encounter: Payer: Self-pay | Admitting: Nurse Practitioner

## 2018-02-28 VITALS — BP 142/88 | HR 72 | Temp 96.8°F

## 2018-02-28 DIAGNOSIS — I10 Essential (primary) hypertension: Secondary | ICD-10-CM

## 2018-02-28 DIAGNOSIS — K219 Gastro-esophageal reflux disease without esophagitis: Secondary | ICD-10-CM | POA: Diagnosis not present

## 2018-02-28 DIAGNOSIS — M797 Fibromyalgia: Secondary | ICD-10-CM

## 2018-02-28 DIAGNOSIS — F3342 Major depressive disorder, recurrent, in full remission: Secondary | ICD-10-CM

## 2018-02-28 DIAGNOSIS — G2581 Restless legs syndrome: Secondary | ICD-10-CM

## 2018-02-28 DIAGNOSIS — F5101 Primary insomnia: Secondary | ICD-10-CM

## 2018-02-28 DIAGNOSIS — G43009 Migraine without aura, not intractable, without status migrainosus: Secondary | ICD-10-CM

## 2018-02-28 DIAGNOSIS — J4520 Mild intermittent asthma, uncomplicated: Secondary | ICD-10-CM

## 2018-02-28 DIAGNOSIS — E782 Mixed hyperlipidemia: Secondary | ICD-10-CM

## 2018-02-28 MED ORDER — TRAMADOL HCL 50 MG PO TABS: 50.0000 mg | ORAL_TABLET | Freq: Three times a day (TID) | ORAL | 0 refills | Status: DC | PRN

## 2018-02-28 MED ORDER — ZOLPIDEM TARTRATE 10 MG PO TABS: 10.0000 mg | ORAL_TABLET | Freq: Every day | ORAL | 2 refills | Status: DC

## 2018-02-28 NOTE — Patient Instructions (Signed)
Myofascial Pain Syndrome and Fibromyalgia Myofascial pain syndrome and fibromyalgia are both pain disorders. This pain may be felt mainly in your muscles.  Myofascial pain syndrome: ? Always has trigger points or tender points in the muscle that will cause pain when pressed. The pain may come and go. ? Usually affects your neck, upper back, and shoulder areas. The pain often radiates into your arms and hands.  Fibromyalgia: ? Has muscle pains and tenderness that come and go. ? Is often associated with fatigue and sleep disturbances. ? Has trigger points. ? Tends to be long-lasting (chronic), but is not life-threatening.  Fibromyalgia and myofascial pain are not the same. However, they often occur together. If you have both conditions, each can make the other worse. Both are common and can cause enough pain and fatigue to make day-to-day activities difficult. What are the causes? The exact causes of fibromyalgia and myofascial pain are not known. People with certain gene types may be more likely to develop fibromyalgia. Some factors can be triggers for both conditions, such as:  Spine disorders.  Arthritis.  Severe injury (trauma) and other physical stressors.  Being under a lot of stress.  A medical illness.  What are the signs or symptoms? Fibromyalgia The main symptom of fibromyalgia is widespread pain and tenderness in your muscles. This can vary over time. Pain is sometimes described as stabbing, shooting, or burning. You may have tingling or numbness, too. You may also have sleep problems and fatigue. You may wake up feeling tired and groggy (fibro fog). Other symptoms may include:  Bowel and bladder problems.  Headaches.  Visual problems.  Problems with odors and noises.  Depression or mood changes.  Painful menstrual periods (dysmenorrhea).  Dry skin or eyes.  Myofascial pain syndrome Symptoms of myofascial pain syndrome include:  Tight, ropy bands of  muscle.  Uncomfortable sensations in muscular areas, such as: ? Aching. ? Cramping. ? Burning. ? Numbness. ? Tingling. ? Muscle weakness.  Trouble moving certain muscles freely (range of motion).  How is this diagnosed? There are no specific tests to diagnose fibromyalgia or myofascial pain syndrome. Both can be hard to diagnose because their symptoms are common in many other conditions. Your health care provider may suspect one or both of these conditions based on your symptoms and medical history. Your health care provider will also do a physical exam. The key to diagnosing fibromyalgia is having pain, fatigue, and other symptoms for more than three months that cannot be explained by another condition. The key to diagnosing myofascial pain syndrome is finding trigger points in muscles that are tender and cause pain elsewhere in your body (referred pain). How is this treated? Treating fibromyalgia and myofascial pain often requires a team of health care providers. This usually starts with your primary provider and a physical therapist. You may also find it helpful to work with alternative health care providers, such as massage therapists or acupuncturists. Treatment for fibromyalgia may include medicines. This may include nonsteroidal anti-inflammatory drugs (NSAIDs), along with other medicines. Treatment for myofascial pain may also include:  NSAIDs.  Cooling and stretching of muscles.  Trigger point injections.  Sound wave (ultrasound) treatments to stimulate muscles.  Follow these instructions at home:  Take medicines only as directed by your health care provider.  Exercise as directed by your health care provider or physical therapist.  Try to avoid stressful situations.  Practice relaxation techniques to control your stress. You may want to try: ? Biofeedback. ? Visual   imagery. ? Hypnosis. ? Muscle relaxation. ? Yoga. ? Meditation.  Talk to your health care provider  about alternative treatments, such as acupuncture or massage treatment.  Maintain a healthy lifestyle. This includes eating a healthy diet and getting enough sleep.  Consider joining a support group.  Do not do activities that stress or strain your muscles. That includes repetitive motions and heavy lifting. Where to find more information:  National Fibromyalgia Association: www.fmaware.org  Arthritis Foundation: www.arthritis.org  American Chronic Pain Association: www.theacpa.org/condition/myofascial-pain Contact a health care provider if:  You have new symptoms.  Your symptoms get worse.  You have side effects from your medicines.  You have trouble sleeping.  Your condition is causing depression or anxiety. This information is not intended to replace advice given to you by your health care provider. Make sure you discuss any questions you have with your health care provider. Document Released: 05/18/2005 Document Revised: 10/24/2015 Document Reviewed: 02/21/2014 Elsevier Interactive Patient Education  2018 Elsevier Inc.  

## 2018-02-28 NOTE — Progress Notes (Signed)
Subjective:    Patient ID: Marie Carter, female    DOB: 11/01/1960, 57 y.o.   MRN: [id number]   Chief Complaint: medical management of chronic issues  HPI:  1. Essential hypertension  No c/o chest pain, sob or headache. Does not check blood pressure at home. BP Readings from Last 3 Encounters:  11/25/17 (!) 142/86  08/17/17 (!) 143/89  05/18/17 132/84     2. Migraine without aura and without status migrainosus, not intractable She has about 1 migraine a montj. She says they have been much better.   3. Mild intermittent chronic asthma without complication  She is not on any maintenance inhalers- uses proair only when need which is usually les that 1x a month.  4. Gastroesophageal reflux disease without esophagitis  She takes protonix daily which helps control her symptoms.  5. Mixed hyperlipidemia  She does not watch her diet and does very little exercise.  6. Fibromyalgia  Has daily pain, rate s 2-6/10. The cymbalta and neurontin really helps- it keeps he rform having to start on actual pin meds. Pain assessment: Cause of pain- muscle pain Pain location- varies from day to day Pain on scale of 1-10- 7/10 Frequency- dialy What increases pain-movement usually What makes pain Better-rest Effects on ADL - usually able to get done what she needs to. Some days are worse then others Any change in general medical condition-none  Current medications- tramadol 90m tid Effectiveness of current meds-helps Adverse reactions form pain meds-none Morphine equivalent- 15  Pill count performed-Yes Urine drug screen- No [facility] [employer] [employer]  If yes were their any concerning findings? - none  Pain contract signed on: 02/28/18   7. Recurrent major depressive disorder, in full remission ([place]  The cymbalta seems to  Really help with er mood as well.  8. Primary insomnia  Cannot sleep without her ambien  9. RLS (restless legs syndrome)  Takes mirapex nightly- when sh does not  take her legs will move all night long  10. Severe obesity (BMI >= 40) (HCC)  No recent weight chnages    Outpatient Encounter Medications as of 02/28/2018  Medication Sig  . atorvastatin (LIPITOR) 40 MG tablet Take 1 tablet (40 mg total) by mouth daily.  . baclofen (LIORESAL) 10 MG tablet Take 1 tablet (10 mg total) by mouth 3 (three) times daily.  . DULoxetine (CYMBALTA) 60 MG capsule Take 1 capsule (60 mg total) by mouth daily.  .Marland KitchenEPINEPHrine 0.3 mg/0.3 mL IJ SOAJ injection Inject 0.3 mLs (0.3 mg total) into the muscle once.  . fesoterodine (TOVIAZ) 8 MG TB24 tablet Take 1 tablet (8 mg total) by mouth daily.  .Marland Kitchengabapentin (NEURONTIN) 300 MG capsule Take 1 capsule (300 mg total) by mouth 3 (three) times daily.  .Marland Kitchenlisinopril-hydrochlorothiazide (PRINZIDE,ZESTORETIC) 20-12.5 MG tablet Take 2 tablets by mouth daily.  . medroxyPROGESTERone (PROVERA) 10 MG tablet Take 1 tablet (10 mg total) by mouth daily.  . pantoprazole (PROTONIX) 40 MG tablet Take 1 tablet (40 mg total) by mouth daily.  . pramipexole (MIRAPEX) 1 MG tablet Take 1 tablet (1 mg total) by mouth 2 (two) times daily.  .Marland KitchenPROAIR HFA 108 (90 Base) MCG/ACT inhaler USE 2 INHALATIONS EVERY 6 HOURS AS NEEDED FOR WHEEZING  . topiramate (TOPAMAX) 50 MG tablet Take 1 tablet (50 mg total) by mouth 3 (three) times daily.  .Marland Kitchenzolpidem (AMBIEN) 10 MG tablet Take 1 tablet (10 mg total) by mouth at bedtime.  New complaints: none  Social history: Still grieving death of granddaughter in a skiing accident at age8 this past winter. Now son is having martial problems due to the death.  Review of Systems  Constitutional: Negative for activity change and appetite change.  HENT: Negative.   Eyes: Negative for pain.  Respiratory: Negative for shortness of breath.   Cardiovascular: Negative for chest pain, palpitations and leg swelling.  Gastrointestinal: Negative for abdominal pain.  Endocrine: Negative for polydipsia.  Genitourinary:  Negative.   Skin: Negative for rash.  Neurological: Negative for dizziness, weakness and headaches.  Hematological: Does not bruise/bleed easily.  Psychiatric/Behavioral: Negative.   All other systems reviewed and are negative.      Objective:   Physical Exam  Constitutional: She is oriented to person, place, and time. She appears well-developed and well-nourished. No distress.  HENT:  Head: Normocephalic.  Nose: Nose normal.  Mouth/Throat: Oropharynx is clear and moist.  Eyes: Pupils are equal, round, and reactive to light. EOM are normal.  Neck: Normal range of motion. Neck supple. No JVD present. Carotid bruit is not present.  Cardiovascular: Normal rate, regular rhythm, normal heart sounds and intact distal pulses.  Pulmonary/Chest: Effort normal and breath sounds normal. No respiratory distress. She has no wheezes. She has no rales. She exhibits no tenderness.  Abdominal: Soft. Normal appearance, normal aorta and bowel sounds are normal. She exhibits no distension, no abdominal bruit, no pulsatile midline mass and no mass. There is no splenomegaly or hepatomegaly. There is no tenderness.  Musculoskeletal: Normal range of motion. She exhibits no edema.  Walking with walker Pain up and down spine to palpation  Lymphadenopathy:    She has no cervical adenopathy.  Neurological: She is alert and oriented to person, place, and time. She has normal reflexes.  Skin: Skin is warm and dry.  Psychiatric: She has a normal mood and affect. Her behavior is normal. Judgment and thought content normal.  Nursing note and vitals reviewed.   BP (!) 142/88 (BP Location: Left Arm, Cuff Size: Normal)   Pulse 72   Temp (!) 96.8 F (36 C) (Oral)   LMP 09/13/2012         Assessment & Plan:  Zoeya Gramajo comes in today with chief complaint of Medical Management of Chronic Issues   Diagnosis and orders addressed:  1. Essential hypertension Low sodium diet - CMP14+EGFR  2. Migraine  without aura and without status migrainosus, not intractable Avoid caffeine Keep diary of events  3. Mild intermittent chronic asthma without complication  4. Gastroesophageal reflux disease without esophagitis Avoid spicy foods Do not eat 2 hours prior to bedtime  5. Mixed hyperlipidemia Low fat diet - Lipid panel  6. Fibromyalgia Continue pain meds as rx  7. Recurrent major depressive disorder, in full remission ([place]) Exercise when can  8. Primary insomnia Bedtime routine - zolpidem (AMBIEN) 10 MG tablet; Take 1 tablet (10 mg total) by mouth at bedtime.  Dispense: 30 tablet; Refill: 2  9. RLS (restless legs syndrome) Keep legs warm  10. Severe obesity (BMI >= 40) (HCC) Discussed diet and exercise for person with BMI >25 Will recheck weight in 3-6 months    Labs pending Health Maintenance reviewed Diet and exercise encouraged  Follow up plan: 3 mpnths   Mary-Margaret Hassell Done, FNP

## 2018-03-01 LAB — LIPID PANEL
CHOL/HDL RATIO: 3.8 ratio (ref 0.0–4.4)
Cholesterol, Total: 204 mg/dL — ABNORMAL HIGH (ref 100–199)
HDL: 53 mg/dL (ref >39)
LDL Calculated: 131 mg/dL — ABNORMAL HIGH (ref 0–99)
TRIGLYCERIDES: 98 mg/dL (ref 0–149)
VLDL Cholesterol Cal: 20 mg/dL (ref 5–40)

## 2018-03-01 LAB — CMP14+EGFR
A/G RATIO: 1.6 (ref 1.2–2.2)
ALT: 27 IU/L (ref 0–32)
AST: 19 IU/L (ref 0–40)
Albumin: 4.4 g/dL (ref 3.5–5.5)
Alkaline Phosphatase: 116 IU/L (ref 39–117)
BILIRUBIN TOTAL: 0.3 mg/dL (ref 0.0–1.2)
BUN/Creatinine Ratio: 20 (ref 9–23)
BUN: 19 mg/dL (ref 6–24)
CALCIUM: 9.3 mg/dL (ref 8.7–10.2)
CO2: 25 mmol/L (ref 20–29)
Chloride: 100 mmol/L (ref 96–106)
Creatinine, Ser: 0.95 mg/dL (ref 0.57–1.00)
GFR, EST AFRICAN AMERICAN: 76 mL/min/{1.73_m2} (ref >59)
GFR, EST NON AFRICAN AMERICAN: 66 mL/min/{1.73_m2} (ref >59)
GLOBULIN, TOTAL: 2.8 g/dL (ref 1.5–4.5)
Glucose: 72 mg/dL (ref 65–99)
POTASSIUM: 4.2 mmol/L (ref 3.5–5.2)
SODIUM: 143 mmol/L (ref 134–144)
Total Protein: 7.2 g/dL (ref 6.0–8.5)

## 2018-04-07 ENCOUNTER — Other Ambulatory Visit: Payer: Self-pay | Admitting: Nurse Practitioner

## 2018-04-07 DIAGNOSIS — K219 Gastro-esophageal reflux disease without esophagitis: Secondary | ICD-10-CM

## 2018-04-07 DIAGNOSIS — I10 Essential (primary) hypertension: Secondary | ICD-10-CM

## 2018-04-26 ENCOUNTER — Other Ambulatory Visit: Payer: Self-pay | Admitting: *Deleted

## 2018-04-26 MED ORDER — TRAMADOL HCL 50 MG PO TABS: 50.0000 mg | ORAL_TABLET | Freq: Three times a day (TID) | ORAL | 0 refills | Status: DC | PRN

## 2018-04-26 NOTE — Progress Notes (Signed)
I have spoke with pt and WM _ [facility] has lost the last 2 of 3 rx for tramadol that you sent for this pt on 02/28/18. They requested that you re-send the last 2. Thank  You.

## 2018-06-02 ENCOUNTER — Ambulatory Visit (INDEPENDENT_AMBULATORY_CARE_PROVIDER_SITE_OTHER): Admitting: Nurse Practitioner

## 2018-06-02 ENCOUNTER — Encounter: Payer: Self-pay | Admitting: Nurse Practitioner

## 2018-06-02 VITALS — BP 124/82 | HR 83 | Temp 97.7°F | Ht 61.0 in

## 2018-06-02 DIAGNOSIS — G43009 Migraine without aura, not intractable, without status migrainosus: Secondary | ICD-10-CM

## 2018-06-02 DIAGNOSIS — E782 Mixed hyperlipidemia: Secondary | ICD-10-CM

## 2018-06-02 DIAGNOSIS — F3342 Major depressive disorder, recurrent, in full remission: Secondary | ICD-10-CM

## 2018-06-02 DIAGNOSIS — F5101 Primary insomnia: Secondary | ICD-10-CM

## 2018-06-02 DIAGNOSIS — G2581 Restless legs syndrome: Secondary | ICD-10-CM

## 2018-06-02 DIAGNOSIS — K219 Gastro-esophageal reflux disease without esophagitis: Secondary | ICD-10-CM

## 2018-06-02 DIAGNOSIS — I10 Essential (primary) hypertension: Secondary | ICD-10-CM | POA: Diagnosis not present

## 2018-06-02 DIAGNOSIS — M797 Fibromyalgia: Secondary | ICD-10-CM

## 2018-06-02 MED ORDER — TRAMADOL HCL 50 MG PO TABS: 50.0000 mg | ORAL_TABLET | Freq: Three times a day (TID) | ORAL | 0 refills | Status: DC | PRN

## 2018-06-02 MED ORDER — GABAPENTIN 300 MG PO CAPS: 300.0000 mg | ORAL_CAPSULE | Freq: Three times a day (TID) | ORAL | 1 refills | Status: DC

## 2018-06-02 MED ORDER — ZOLPIDEM TARTRATE 10 MG PO TABS: 10.0000 mg | ORAL_TABLET | Freq: Every day | ORAL | 2 refills | Status: DC

## 2018-06-02 MED ORDER — LISINOPRIL-HYDROCHLOROTHIAZIDE 20-12.5 MG PO TABS: 2.0000 | ORAL_TABLET | Freq: Every day | ORAL | 1 refills | Status: DC

## 2018-06-02 MED ORDER — OMEPRAZOLE 40 MG PO CPDR: 40.0000 mg | DELAYED_RELEASE_CAPSULE | Freq: Every day | ORAL | 1 refills | Status: DC

## 2018-06-02 MED ORDER — TOPIRAMATE 50 MG PO TABS: 50.0000 mg | ORAL_TABLET | Freq: Three times a day (TID) | ORAL | 1 refills | Status: DC

## 2018-06-02 MED ORDER — DULOXETINE HCL 60 MG PO CPEP: 60.0000 mg | ORAL_CAPSULE | Freq: Every day | ORAL | 1 refills | Status: DC

## 2018-06-02 MED ORDER — PANTOPRAZOLE SODIUM 40 MG PO TBEC: 40.0000 mg | DELAYED_RELEASE_TABLET | Freq: Every day | ORAL | 1 refills | Status: DC

## 2018-06-02 MED ORDER — PRAMIPEXOLE DIHYDROCHLORIDE 1 MG PO TABS: 1.0000 mg | ORAL_TABLET | Freq: Two times a day (BID) | ORAL | 1 refills | Status: DC

## 2018-06-02 MED ORDER — ATORVASTATIN CALCIUM 40 MG PO TABS: 40.0000 mg | ORAL_TABLET | Freq: Every day | ORAL | 1 refills | Status: DC

## 2018-06-02 MED ORDER — BACLOFEN 10 MG PO TABS: 10.0000 mg | ORAL_TABLET | Freq: Three times a day (TID) | ORAL | 5 refills | Status: DC

## 2018-06-02 NOTE — Addendum Note (Signed)
Addended by: Bennie Pierini on: 06/02/2018 05:00 PM   Modules accepted: Orders

## 2018-06-02 NOTE — Patient Instructions (Signed)

## 2018-06-02 NOTE — Progress Notes (Signed)
Subjective:    Patient ID: Marie Carter, female    DOB: 04-25-1961, 58 y.o.   MRN: [id number]   Chief Complaint: medical management of chronic issues  HPI:  1. Essential hypertension  No c/o chest pain or SOB. She does have occasional headaches but are migraines rather then a standard headache. She doe snot check blood pressure at home. BP Readings from Last 3 Encounters:  02/28/18 (!) 142/88  11/25/17 (!) 142/86  08/17/17 (!) 143/89     2. Gastroesophageal reflux disease without esophagitis  She takes omeprazole and protonix both daily. She will still have occasional symptoms if she over eats.  3. Severe obesity (BMI >= 40) (HCC)  No recent weight changes  4. RLS (restless legs syndrome)  She is on mirapex which says she works well for her.  5. Primary insomnia  Takes ambien to sleep at night which she says works well most nights.  6. Mixed hyperlipidemia  Does not really watch diet and does very little exercise  7. Fibromyalgia  She is on cymbalta, baclofen and neurontin. She rate [place] 2/10 most days, but can go as high as 8/10 at times. She also takes ultram 3x a day.  8. Recurrent major depressive disorder, in full remission (HCC)  cymbalta helps with her depression. She is still struggling with the death of her granddaughter in a freak skiing accident.  9. Migraine without aura and without status migrainosus, not intractable  Takes topamax daily which keeps migraines down to about 2x a month. Last migraine was at [facility] and was pretty bad, with nausea and vomiting    Outpatient Encounter Medications as of 06/02/2018  Medication Sig  . atorvastatin (LIPITOR) 40 MG tablet Take 1 tablet (40 mg total) by mouth daily.  . baclofen (LIORESAL) 10 MG tablet Take 1 tablet (10 mg total) by mouth 3 (three) times daily.  . DULoxetine (CYMBALTA) 60 MG capsule Take 1 capsule (60 mg total) by mouth daily.  [name] EPINEPHrine 0.3 mg/0.3 mL IJ SOAJ injection Inject 0.3 mLs (0.3 mg total)  into the muscle once.  . fesoterodine (TOVIAZ) 8 MG TB24 tablet Take 1 tablet (8 mg total) by mouth daily.  [name] gabapentin (NEURONTIN) 300 MG capsule Take 1 capsule (300 mg total) by mouth 3 (three) times daily.  [name] lisinopril-hydrochlorothiazide (PRINZIDE,ZESTORETIC) 20-12.5 MG tablet TAKE 2 TABLETS DAILY  . medroxyPROGESTERone (PROVERA) 10 MG tablet TAKE 1 TABLET DAILY  . omeprazole (PRILOSEC) 40 MG capsule TAKE 1 CAPSULE DAILY  . pantoprazole (PROTONIX) 40 MG tablet Take 1 tablet (40 mg total) by mouth daily.  . pramipexole (MIRAPEX) 1 MG tablet Take 1 tablet (1 mg total) by mouth 2 (two) times daily.  [name] PROAIR HFA 108 (90 Base) MCG/ACT inhaler USE 2 INHALATIONS EVERY 6 HOURS AS NEEDED FOR WHEEZING  . topiramate (TOPAMAX) 50 MG tablet Take 1 tablet (50 mg total) by mouth 3 (three) times daily.  . traMADol (ULTRAM) 50 MG tablet Take 1 tablet (50 mg total) by mouth every 8 (eight) hours as needed.  . traMADol (ULTRAM) 50 MG tablet Take 1 tablet (50 mg total) by mouth every 8 (eight) hours as needed.  . traMADol (ULTRAM) 50 MG tablet Take 1 tablet (50 mg total) by mouth every 8 (eight) hours as needed.  . zolpidem (AMBIEN) 10 MG tablet Take 1 tablet (10 mg total) by mouth at bedtime.     New complaints: None today  Social history: Lives with husband and daughter.    Review  of Systems  Constitutional: Negative for activity change and appetite change.  HENT: Negative.   Eyes: Negative for pain.  Respiratory: Negative for shortness of breath.   Cardiovascular: Negative for chest pain, palpitations and leg swelling.  Gastrointestinal: Negative for abdominal pain.  Endocrine: Negative for polydipsia.  Genitourinary: Negative.   Skin: Negative for rash.  Neurological: Negative for dizziness, weakness and headaches.  Hematological: Does not bruise/bleed easily.  Psychiatric/Behavioral: Negative.   All other systems reviewed and are negative.      Objective:   Physical Exam Vitals  signs and nursing note reviewed.  Constitutional:      General: She is not in acute distress.    Appearance: Normal appearance. She is well-developed.  HENT:     Head: Normocephalic.     Nose: Nose normal.  Eyes:     Pupils: Pupils are equal, round, and reactive to light.  Neck:     Musculoskeletal: Normal range of motion and neck supple.     Vascular: No carotid bruit or JVD.  Cardiovascular:     Rate and Rhythm: Normal rate and regular rhythm.     Heart sounds: Normal heart sounds.  Pulmonary:     Effort: Pulmonary effort is normal. No respiratory distress.     Breath sounds: Normal breath sounds. No wheezing or rales.  Chest:     Chest wall: No tenderness.  Abdominal:     General: Bowel sounds are normal. There is no distension or abdominal bruit.     Palpations: Abdomen is soft. There is no hepatomegaly, splenomegaly, mass or pulsatile mass.     Tenderness: There is no abdominal tenderness.  Musculoskeletal: Normal range of motion.     Right lower leg: Edema (1+) present.     Left lower leg: Edema (1+) present.     Comments: Walking with walker- slow and steady  Lymphadenopathy:     Cervical: No cervical adenopathy.  Skin:    General: Skin is warm and dry.  Neurological:     Mental Status: She is alert and oriented to person, place, and time.     Deep Tendon Reflexes: Reflexes are normal and symmetric.  Psychiatric:        Behavior: Behavior normal.        Thought Content: Thought content normal.        Judgment: Judgment normal.    BP 124/82 (BP Location: Left Arm, Cuff Size: Large)   Pulse 83   Temp 97.7 F (36.5 C) (Oral)   Ht 5\' 1"  (1.549 m)   LMP 09/13/2012   BMI 50.45 kg/m        Assessment & Plan:  Wilson Singeramela Athanas comes in today with chief complaint of Medical Management of Chronic Issues   Diagnosis and orders addressed:  1. Essential hypertension Low sodium diet - lisinopril-hydrochlorothiazide (PRINZIDE,ZESTORETIC) 20-12.5 MG tablet; Take 2  tablets by mouth daily.  Dispense: 180 tablet; Refill: 1  2. Gastroesophageal reflux disease without esophagitis Avoid spicy foods Do not eat 2 hours prior to bedtime - omeprazole (PRILOSEC) 40 MG capsule; Take 1 capsule (40 mg total) by mouth daily.  Dispense: 90 capsule; Refill: 1 - pantoprazole (PROTONIX) 40 MG tablet; Take 1 tablet (40 mg total) by mouth daily.  Dispense: 90 tablet; Refill: 1  3. Severe obesity (BMI >= 40) (HCC) Discussed diet and exercise for person with BMI >25 Will recheck weight in 3-6 months  4. RLS (restless legs syndrome) Keep legs warm - pramipexole (MIRAPEX) 1 MG tablet;  Take 1 tablet (1 mg total) by mouth 2 (two) times daily.  Dispense: 180 tablet; Refill: 1  5. Primary insomnia Bedtime routine - zolpidem (AMBIEN) 10 MG tablet; Take 1 tablet (10 mg total) by mouth at bedtime.  Dispense: 30 tablet; Refill: 2  6. Mixed hyperlipidemia Low fat diet - atorvastatin (LIPITOR) 40 MG tablet; Take 1 tablet (40 mg total) by mouth daily.  Dispense: 90 tablet; Refill: 1  7. Fibromyalgia Exercise as much as possible to kep muscles warm - DULoxetine (CYMBALTA) 60 MG capsule; Take 1 capsule (60 mg total) by mouth daily.  Dispense: 90 capsule; Refill: 1 - gabapentin (NEURONTIN) 300 MG capsule; Take 1 capsule (300 mg total) by mouth 3 (three) times daily.  Dispense: 540 capsule; Refill: 1 - baclofen (LIORESAL) 10 MG tablet; Take 1 tablet (10 mg total) by mouth 3 (three) times daily.  Dispense: 90 each; Refill: 5  8. Recurrent major depressive disorder, in full remission (HCC) Continue cymbalta and stress management  9. Migraine without aura and without status migrainosus, not intractable Avoid caffeine - topiramate (TOPAMAX) 50 MG tablet; Take 1 tablet (50 mg total) by mouth 3 (three) times daily.  Dispense: 270 tablet; Refill: 1   Labs pending Health Maintenance reviewed Diet and exercise encouraged  Follow up plan: 3 months   Mary-Margaret Daphine Deutscher, FNP

## 2018-06-02 NOTE — Addendum Note (Signed)
Addended by: Cleda Daub on: 06/02/2018 04:33 PM   Modules accepted: Orders

## 2018-06-03 LAB — CMP14+EGFR
ALK PHOS: 131 IU/L — AB (ref 39–117)
ALT: 27 IU/L (ref 0–32)
AST: 14 IU/L (ref 0–40)
Albumin/Globulin Ratio: 1.5 (ref 1.2–2.2)
Albumin: 4.2 g/dL (ref 3.5–5.5)
BUN/Creatinine Ratio: 17 (ref 9–23)
BUN: 18 mg/dL (ref 6–24)
Bilirubin Total: 0.3 mg/dL (ref 0.0–1.2)
CO2: 21 mmol/L (ref 20–29)
CREATININE: 1.07 mg/dL — AB (ref 0.57–1.00)
Calcium: 9.1 mg/dL (ref 8.7–10.2)
Chloride: 103 mmol/L (ref 96–106)
GFR calc Af Amer: 66 mL/min/{1.73_m2} (ref >59)
GFR calc non Af Amer: 57 mL/min/{1.73_m2} — ABNORMAL LOW (ref >59)
GLOBULIN, TOTAL: 2.8 g/dL (ref 1.5–4.5)
Glucose: 92 mg/dL (ref 65–99)
POTASSIUM: 4.2 mmol/L (ref 3.5–5.2)
SODIUM: 144 mmol/L (ref 134–144)
Total Protein: 7 g/dL (ref 6.0–8.5)

## 2018-06-03 LAB — LIPID PANEL
Chol/HDL Ratio: 4.1 ratio (ref 0.0–4.4)
Cholesterol, Total: 217 mg/dL — ABNORMAL HIGH (ref 100–199)
HDL: 53 mg/dL (ref >39)
LDL CALC: 147 mg/dL — AB (ref 0–99)
TRIGLYCERIDES: 85 mg/dL (ref 0–149)
VLDL Cholesterol Cal: 17 mg/dL (ref 5–40)

## 2018-09-01 ENCOUNTER — Other Ambulatory Visit: Payer: Self-pay

## 2018-09-01 ENCOUNTER — Ambulatory Visit (INDEPENDENT_AMBULATORY_CARE_PROVIDER_SITE_OTHER): Admitting: Nurse Practitioner

## 2018-09-01 ENCOUNTER — Encounter: Payer: Self-pay | Admitting: Nurse Practitioner

## 2018-09-01 DIAGNOSIS — F5101 Primary insomnia: Secondary | ICD-10-CM

## 2018-09-01 DIAGNOSIS — M797 Fibromyalgia: Secondary | ICD-10-CM | POA: Diagnosis not present

## 2018-09-01 DIAGNOSIS — K219 Gastro-esophageal reflux disease without esophagitis: Secondary | ICD-10-CM

## 2018-09-01 DIAGNOSIS — E782 Mixed hyperlipidemia: Secondary | ICD-10-CM | POA: Diagnosis not present

## 2018-09-01 DIAGNOSIS — I10 Essential (primary) hypertension: Secondary | ICD-10-CM

## 2018-09-01 DIAGNOSIS — F3342 Major depressive disorder, recurrent, in full remission: Secondary | ICD-10-CM

## 2018-09-01 DIAGNOSIS — R3915 Urgency of urination: Secondary | ICD-10-CM

## 2018-09-01 DIAGNOSIS — G2581 Restless legs syndrome: Secondary | ICD-10-CM

## 2018-09-01 MED ORDER — TRAMADOL HCL 50 MG PO TABS: 50.0000 mg | ORAL_TABLET | Freq: Three times a day (TID) | ORAL | 0 refills | Status: DC | PRN

## 2018-09-01 MED ORDER — FESOTERODINE FUMARATE ER 8 MG PO TB24: 8.0000 mg | ORAL_TABLET | Freq: Every day | ORAL | 1 refills | Status: DC

## 2018-09-01 MED ORDER — ZOLPIDEM TARTRATE 10 MG PO TABS: 10.0000 mg | ORAL_TABLET | Freq: Every day | ORAL | 2 refills | Status: DC

## 2018-09-01 NOTE — Progress Notes (Signed)
Patient ID: Marie Carter, female   DOB: 02-01-1961, 58 y.o.   MRN: [id number]    Virtual Visit via telephone Note  I connected with@ on 09/01/18 at 2:10 PM by video and verified that I am speaking with the correct person using two identifiers. Marie Carter is currently located at home and her husband is currently with her during visit. The provider, Mary-Margaret Daphine Deutscher, FNP is located in their office at time of visit.  I discussed the limitations, risks, security and privacy concerns of performing an evaluation and management service by telephone and the availability of in person appointments. I also discussed with the patient that there may be a patient responsible charge related to this service. The patient expressed understanding and agreed to proceed.   History and Present Illness:   Chief Complaint: Medical Management of Chronic Issues   HPI:  1. Essential hypertension  No c/o chest pain, sob or headache. Does not check blood pressure at home. BP Readings from Last 3 Encounters:  06/02/18 124/82  02/28/18 (!) 142/88  11/25/17 (!) 142/86     2. Mixed hyperlipidemia  Does not watch diet and does very little exercise.  3. Gastroesophageal reflux disease without esophagitis  Is on protonix and is working well to keep symptoms under control  4. Fibromyalgia  Has been hurting more since she has been couped up in the house. Pain assessment: Cause of pain- fibromyalgia Pain location- places vary Pain on scale of 1-10- 7/10 Frequency- daily What increases pain-lots of activity What makes pain Better-pain pills help Effects on ADL - makes herself do what she has to.  Any change in general medical condition-none  Current opioids rx- ultram 50mg  TID # meds rx- 90 Effectiveness of current meds-helps bring pain down to 2/10 Adverse reactions form pain meds-none Morphine equivalent-  Pill count performed-No Last drug screen - not in chart- will do at next visit ( high  risk q71m, moderate risk q4m, low risk yearly ) Urine drug screen today- No Was the NCCSR reviewed- yes  If yes were their any concerning findings? - no  Pain contract signed on: 03/01/18   5. Recurrent major depressive disorder, in full remission (HCC)  deneis any depression. cymbalta helps  6. Primary insomnia  ambien nightly and helps her sleep  7. RLS (restless legs syndrome)  Is on mirapex nightly and helps her to sleep better.  8. Severe obesity (BMI >= 40) (HCC)  No weight changes    Outpatient Encounter Medications as of 09/01/2018  Medication Sig  . atorvastatin (LIPITOR) 40 MG tablet Take 1 tablet (40 mg total) by mouth daily.  . baclofen (LIORESAL) 10 MG tablet Take 1 tablet (10 mg total) by mouth 3 (three) times daily.  . DULoxetine (CYMBALTA) 60 MG capsule Take 1 capsule (60 mg total) by mouth daily.  [name] EPINEPHrine 0.3 mg/0.3 mL IJ SOAJ injection Inject 0.3 mLs (0.3 mg total) into the muscle once.  . fesoterodine (TOVIAZ) 8 MG TB24 tablet Take 1 tablet (8 mg total) by mouth daily.  [name] gabapentin (NEURONTIN) 300 MG capsule Take 1 capsule (300 mg total) by mouth 3 (three) times daily.  [name] lisinopril-hydrochlorothiazide (PRINZIDE,ZESTORETIC) 20-12.5 MG tablet Take 2 tablets by mouth daily.  . medroxyPROGESTERone (PROVERA) 10 MG tablet TAKE 1 TABLET DAILY  . omeprazole (PRILOSEC) 40 MG capsule Take 1 capsule (40 mg total) by mouth daily.  . pantoprazole (PROTONIX) 40 MG tablet Take 1 tablet (40 mg total) by mouth daily.  . pramipexole (  MIRAPEX) 1 MG tablet Take 1 tablet (1 mg total) by mouth 2 (two) times daily.  [name] PROAIR HFA 108 (90 Base) MCG/ACT inhaler USE 2 INHALATIONS EVERY 6 HOURS AS NEEDED FOR WHEEZING  . topiramate (TOPAMAX) 50 MG tablet Take 1 tablet (50 mg total) by mouth 3 (three) times daily.  . traMADol (ULTRAM) 50 MG tablet Take 1 tablet (50 mg total) by mouth every 8 (eight) hours as needed.  . traMADol (ULTRAM) 50 MG tablet Take 1 tablet (50 mg total) by mouth  every 8 (eight) hours as needed.  . traMADol (ULTRAM) 50 MG tablet Take 1 tablet (50 mg total) by mouth every 8 (eight) hours as needed.  . zolpidem (AMBIEN) 10 MG tablet Take 1 tablet (10 mg total) by mouth at bedtime.     New complaints: nothing  Social history: Husband and daughter   Review of Systems  Constitutional: Negative for diaphoresis and weight loss.  Eyes: Negative for blurred vision, double vision and pain.  Respiratory: Negative for shortness of breath.   Cardiovascular: Positive for leg swelling. Negative for chest pain, palpitations and orthopnea.  Gastrointestinal: Negative for abdominal pain.  Skin: Negative for rash.  Neurological: Negative for dizziness, sensory change, loss of consciousness, weakness and headaches.  Endo/Heme/Allergies: Negative for polydipsia. Does not bruise/bleed easily.  Psychiatric/Behavioral: Negative for depression and memory loss. The patient does not have insomnia.   All other systems reviewed and are negative.      Observations/Objective: Alert and oriented- answers all questions appropriately No resp symptoms noted.  Assessment and Plan: Yailen Gugel video call in today with chief complaint of Medical Management of Chronic Issues   Diagnosis and orders addressed:  1. Essential hypertension Low sodium diet  2. Mixed hyperlipidemia Low fat diet  3. Gastroesophageal reflux disease without esophagitis Avoid spicy foods Do not eat 2 hours prior to bedtime  4. Fibromyalgia Keep muscles warm will help with pain  5. Recurrent major depressive disorder, in full remission [facility]) Stress management  6. Primary insomnia Bedtime routine - zolpidem (AMBIEN) 10 MG tablet; Take 1 tablet (10 mg total) by mouth at bedtime.  Dispense: 30 tablet; Refill: 2  7. RLS (restless legs syndrome) Keep legs warm at night  8. Severe obesity (BMI >= 40) (HCC) Discussed diet and exercise for person with BMI >25 Will recheck weight in 3-6  months  9. Urinary urgency - fesoterodine (TOVIAZ) 8 MG TB24 tablet; Take 1 tablet (8 mg total) by mouth daily.  Dispense: 90 tablet; Refill: 1   Previous labs reviewed Health Maintenance reviewed Diet and exercise encouraged  Follow up plan: 3 month     I discussed the assessment and treatment plan with the patient. The patient was provided an opportunity to ask questions and all were answered. The patient agreed with the plan and demonstrated an understanding of the instructions.   The patient was advised to call back or seek an in-person evaluation if the symptoms worsen or if the condition fails to improve as anticipated.  The above assessment and management plan was discussed with the patient. The patient verbalized understanding of and has agreed to the management plan. Patient is aware to call the clinic if symptoms persist or worsen. Patient is aware when to return to the clinic for a follow-up visit. Patient educated on when it is appropriate to go to the emergency department.    I provided 13 minutes of face-to-face time during this encounter.    Mary-Margaret Daphine Deutscher, FNP

## 2018-10-05 ENCOUNTER — Other Ambulatory Visit: Payer: Self-pay | Admitting: Nurse Practitioner

## 2018-10-05 DIAGNOSIS — K219 Gastro-esophageal reflux disease without esophagitis: Secondary | ICD-10-CM

## 2018-10-06 ENCOUNTER — Other Ambulatory Visit: Payer: Self-pay | Admitting: Nurse Practitioner

## 2018-10-06 DIAGNOSIS — K219 Gastro-esophageal reflux disease without esophagitis: Secondary | ICD-10-CM

## 2018-10-08 ENCOUNTER — Other Ambulatory Visit: Payer: Self-pay | Admitting: *Deleted

## 2018-12-01 ENCOUNTER — Other Ambulatory Visit: Payer: Self-pay | Admitting: Nurse Practitioner

## 2018-12-01 DIAGNOSIS — F5101 Primary insomnia: Secondary | ICD-10-CM

## 2018-12-01 MED ORDER — ZOLPIDEM TARTRATE 10 MG PO TABS: 10.0000 mg | ORAL_TABLET | Freq: Every day | ORAL | 2 refills | Status: DC

## 2018-12-01 NOTE — Telephone Encounter (Signed)
Pt scheduled with MMM 7/6 at 12.

## 2018-12-01 NOTE — Telephone Encounter (Signed)
Patient was last seen in April. Do you want her to schedule a follow up?

## 2018-12-01 NOTE — Telephone Encounter (Signed)
Marie Carter rx sent to pharmacy- [facility]

## 2018-12-05 ENCOUNTER — Ambulatory Visit (INDEPENDENT_AMBULATORY_CARE_PROVIDER_SITE_OTHER): Admitting: Nurse Practitioner

## 2018-12-05 ENCOUNTER — Encounter: Payer: Self-pay | Admitting: Nurse Practitioner

## 2018-12-05 ENCOUNTER — Other Ambulatory Visit: Payer: Self-pay

## 2018-12-05 DIAGNOSIS — K219 Gastro-esophageal reflux disease without esophagitis: Secondary | ICD-10-CM | POA: Diagnosis not present

## 2018-12-05 DIAGNOSIS — E782 Mixed hyperlipidemia: Secondary | ICD-10-CM | POA: Diagnosis not present

## 2018-12-05 DIAGNOSIS — R3915 Urgency of urination: Secondary | ICD-10-CM

## 2018-12-05 DIAGNOSIS — F3342 Major depressive disorder, recurrent, in full remission: Secondary | ICD-10-CM

## 2018-12-05 DIAGNOSIS — G43009 Migraine without aura, not intractable, without status migrainosus: Secondary | ICD-10-CM

## 2018-12-05 DIAGNOSIS — G2581 Restless legs syndrome: Secondary | ICD-10-CM

## 2018-12-05 DIAGNOSIS — F5101 Primary insomnia: Secondary | ICD-10-CM

## 2018-12-05 DIAGNOSIS — M797 Fibromyalgia: Secondary | ICD-10-CM

## 2018-12-05 DIAGNOSIS — I10 Essential (primary) hypertension: Secondary | ICD-10-CM

## 2018-12-05 MED ORDER — ATORVASTATIN CALCIUM 40 MG PO TABS: 40.0000 mg | ORAL_TABLET | Freq: Every day | ORAL | 1 refills | Status: DC

## 2018-12-05 MED ORDER — TRAMADOL HCL 50 MG PO TABS: 50.0000 mg | ORAL_TABLET | Freq: Three times a day (TID) | ORAL | 0 refills | Status: DC | PRN

## 2018-12-05 MED ORDER — TOVIAZ 8 MG PO TB24: 8.0000 mg | ORAL_TABLET | Freq: Every day | ORAL | 1 refills | Status: DC

## 2018-12-05 MED ORDER — PRAMIPEXOLE DIHYDROCHLORIDE 1 MG PO TABS: 1.0000 mg | ORAL_TABLET | Freq: Two times a day (BID) | ORAL | 1 refills | Status: DC

## 2018-12-05 MED ORDER — OMEPRAZOLE 40 MG PO CPDR: 40.0000 mg | DELAYED_RELEASE_CAPSULE | Freq: Every day | ORAL | 1 refills | Status: DC

## 2018-12-05 MED ORDER — TOPIRAMATE 50 MG PO TABS: 50.0000 mg | ORAL_TABLET | Freq: Three times a day (TID) | ORAL | 1 refills | Status: DC

## 2018-12-05 MED ORDER — GABAPENTIN 300 MG PO CAPS: 300.0000 mg | ORAL_CAPSULE | Freq: Three times a day (TID) | ORAL | 1 refills | Status: DC

## 2018-12-05 MED ORDER — LISINOPRIL-HYDROCHLOROTHIAZIDE 20-12.5 MG PO TABS: 2.0000 | ORAL_TABLET | Freq: Every day | ORAL | 1 refills | Status: DC

## 2018-12-05 MED ORDER — ZOLPIDEM TARTRATE 10 MG PO TABS: 10.0000 mg | ORAL_TABLET | Freq: Every day | ORAL | 2 refills | Status: DC

## 2018-12-05 MED ORDER — DULOXETINE HCL 60 MG PO CPEP: 60.0000 mg | ORAL_CAPSULE | Freq: Every day | ORAL | 1 refills | Status: DC

## 2018-12-05 MED ORDER — PANTOPRAZOLE SODIUM 40 MG PO TBEC: 40.0000 mg | DELAYED_RELEASE_TABLET | Freq: Every day | ORAL | 1 refills | Status: DC

## 2018-12-05 NOTE — Progress Notes (Signed)
Virtual Visit via telephone Note  I connected with Marie Carter on 12/05/18 at 12:20 by telephone and verified that I am speaking with the correct person using two identifiers. Marie Singeramela Cloke is currently located at home and no one is currently with her during visit. The provider, Mary-Margaret Daphine DeutscherMartin, FNP is located in their office at time of visit.  I discussed the limitations, risks, security and privacy concerns of performing an evaluation and management service by telephone and the availability of in person appointments. I also discussed with the patient that there may be a patient responsible charge related to this service. The patient expressed understanding and agreed to proceed.   History and Present Illness:   Chief Complaint: Medical Management of Chronic Issues    HPI:  1. Essential hypertension No c/o chest pain, sob or headache. Does not check bloodpressure at home. BP Readings from Last 3 Encounters:  06/02/18 124/82  02/28/18 (!) 142/88  11/25/17 (!) 142/86     2. Mixed hyperlipidemia Does not watch diet and does no exercise  3. Gastroesophageal reflux disease without esophagitis She is on protonix and omeprazole daily. Still has occasional breakthrough symptoms.  4. Migraine without aura and without status migrainosus, not intractable On topamax daily which helps prevent. Still has headache 1-2 x a month. Sometimes meds do not help.  5. Fibromyalgia Pain assessment: Cause of pain- fibromyalgia Pain location- varies form day to day, but back hurts most days Pain on scale of 1-10- 5-6/10 currentlyshe thinks pain ahs worsned since sh ehas been quarantined to home. Frequency- daily What increases pain-trying to do to much What makes pain Better-nothing really Effects on ADL - she does what she needs to Any change in general medical condition-none  Current opioids rx- ultram 50mg  TID along with neurontin # meds rx- 90 Effectiveness of current meds-none  Adverse reactions form pain meds-none Morphine equivalent- 15MEDD  Pill count performed-No Last drug screen - never ( high risk q5344m, moderate risk q7079m, low risk yearly ) Urine drug screen today- No, will do at next visit Was the NCCSR reviewed- yes  If yes were their any concerning findings? - no    Pain contract signed on:03/01/18   6. RLS (restless legs syndrome) Takes mirapex nightly for legs  7. Recurrent major depressive disorder, in full remission (HCC) Is on cymbalta and is doing well  8. Primary insomnia Sleeps well most nights.  9. Severe obesity (BMI >= 40) (HCC) No recent weight chnages    Outpatient Encounter Medications as of 12/05/2018  Medication Sig  . atorvastatin (LIPITOR) 40 MG tablet Take 1 tablet (40 mg total) by mouth daily.  . baclofen (LIORESAL) 10 MG tablet Take 1 tablet (10 mg total) by mouth 3 (three) times daily.  . DULoxetine (CYMBALTA) 60 MG capsule Take 1 capsule (60 mg total) by mouth daily.  [name]. EPINEPHrine 0.3 mg/0.3 mL IJ SOAJ injection Inject 0.3 mLs (0.3 mg total) into the muscle once.  . fesoterodine (TOVIAZ) 8 MG TB24 tablet Take 1 tablet (8 mg total) by mouth daily.  [name]. gabapentin (NEURONTIN) 300 MG capsule Take 1 capsule (300 mg total) by mouth 3 (three) times daily.  [name]. lisinopril-hydrochlorothiazide (PRINZIDE,ZESTORETIC) 20-12.5 MG tablet Take 2 tablets by mouth daily.  . medroxyPROGESTERone (PROVERA) 10 MG tablet TAKE 1 TABLET DAILY  . omeprazole (PRILOSEC) 40 MG capsule TAKE 1 CAPSULE DAILY  . pantoprazole (PROTONIX) 40 MG tablet TAKE 1 TABLET DAILY  . pramipexole (MIRAPEX) 1 MG tablet Take 1 tablet (  1 mg total) by mouth 2 (two) times daily.  [name] PROAIR HFA 108 (90 Base) MCG/ACT inhaler USE 2 INHALATIONS EVERY 6 HOURS AS NEEDED FOR WHEEZING  . topiramate (TOPAMAX) 50 MG tablet Take 1 tablet (50 mg total) by mouth 3 (three) times daily.  . traMADol (ULTRAM) 50 MG tablet Take 1 tablet (50 mg total) by mouth every 8 (eight) hours as needed  for up to 30 days.  . traMADol (ULTRAM) 50 MG tablet Take 1 tablet (50 mg total) by mouth every 8 (eight) hours as needed for up to 30 days.  . traMADol (ULTRAM) 50 MG tablet Take 1 tablet (50 mg total) by mouth every 8 (eight) hours as needed for up to 30 days.  [name] zolpidem (AMBIEN) 10 MG tablet Take 1 tablet (10 mg total) by mouth at bedtime.     Past Surgical History:  Procedure Laterality Date  . BREAST SURGERY     biopsy times 2  . JOINT REPLACEMENT Right   . JOINT REPLACEMENT Left   . SPINE SURGERY     cervical spine    Family History  Problem Relation Age of Onset  . Diabetes Mother   . Heart attack Father 40  . Diabetes Father   . Stroke Maternal Grandmother   . Colon cancer Unknown        family history     New complaints: None today  Social history: Lives with her husband and daughter  Controlled substance contract: 03/01/18    Review of Systems  Constitutional: Negative for diaphoresis and weight loss.  Eyes: Negative for blurred vision, double vision and pain.  Respiratory: Negative for shortness of breath.   Cardiovascular: Negative for chest pain, palpitations, orthopnea and leg swelling.  Gastrointestinal: Negative for abdominal pain.  Skin: Negative for rash.  Neurological: Negative for dizziness, sensory change, loss of consciousness, weakness and headaches.  Endo/Heme/Allergies: Negative for polydipsia. Does not bruise/bleed easily.  Psychiatric/Behavioral: Negative for memory loss. The patient does not have insomnia.   All other systems reviewed and are negative.    Observations/Objective: Alert and oriented- answers all questions appropriately No distress  Assessment and Plan: Chandelle Harkey comes in today with chief complaint of Medical Management of Chronic Issues   Diagnosis and orders addressed:  1. Essential hypertension Low sodium diet - lisinopril-hydrochlorothiazide (ZESTORETIC) 20-12.5 MG tablet; Take 2 tablets by mouth daily.   Dispense: 180 tablet; Refill: 1  2. Mixed hyperlipidemia Low fat diet - atorvastatin (LIPITOR) 40 MG tablet; Take 1 tablet (40 mg total) by mouth daily.  Dispense: 90 tablet; Refill: 1  3. Gastroesophageal reflux disease without esophagitis Avoid spicy foods Do not eat 2 hours prior to bedtime - omeprazole (PRILOSEC) 40 MG capsule; Take 1 capsule (40 mg total) by mouth daily.  Dispense: 90 capsule; Refill: 1 - pantoprazole (PROTONIX) 40 MG tablet; Take 1 tablet (40 mg total) by mouth daily.  Dispense: 90 tablet; Refill: 1  4. Migraine without aura and without status migrainosus, not intractable Discussed possibly trying amivovig or botox injections. Patient will let me know what she decides - topiramate (TOPAMAX) 50 MG tablet; Take 1 tablet (50 mg total) by mouth 3 (three) times daily.  Dispense: 270 tablet; Refill: 1  5. Fibromyalgia Need to try to be more active to see if will elp with pain - DULoxetine (CYMBALTA) 60 MG capsule; Take 1 capsule (60 mg total) by mouth daily.  Dispense: 90 capsule; Refill: 1 - gabapentin (NEURONTIN) 300 MG capsule; Take 1  capsule (300 mg total) by mouth 3 (three) times daily.  Dispense: 540 capsule; Refill: 1  6. RLS (restless legs syndrome) Keep legs warm at night - pramipexole (MIRAPEX) 1 MG tablet; Take 1 tablet (1 mg total) by mouth 2 (two) times daily.  Dispense: 180 tablet; Refill: 1  7. Recurrent major depressive disorder, in full remission (HCC) Stress management  8. Primary insomnia Bedtime routine - zolpidem (AMBIEN) 10 MG tablet; Take 1 tablet (10 mg total) by mouth at bedtime.  Dispense: 30 tablet; Refill: 2  9. Severe obesity (BMI >= 40) (HCC) Discussed diet and exercise for person with BMI >25 Will recheck weight in 3-6 months  10. Urinary urgency - fesoterodine (TOVIAZ) 8 MG TB24 tablet; Take 1 tablet (8 mg total) by mouth daily.  Dispense: 90 tablet; Refill: 1   Labs pending Health Maintenance reviewed Diet and exercise  encouraged  Follow up plan: 3 months    I discussed the assessment and treatment plan with the patient. The patient was provided an opportunity to ask questions and all were answered. The patient agreed with the plan and demonstrated an understanding of the instructions.   The patient was advised to call back or seek an in-person evaluation if the symptoms worsen or if the condition fails to improve as anticipated.  The above assessment and management plan was discussed with the patient. The patient verbalized understanding of and has agreed to the management plan. Patient is aware to call the clinic if symptoms persist or worsen. Patient is aware when to return to the clinic for a follow-up visit. Patient educated on when it is appropriate to go to the emergency department.   Time call ended:  12:45  I provided 15 minutes of non-face-to-face time during this encounter.    Mary-Margaret Daphine DeutscherMartin, FNP

## 2018-12-08 ENCOUNTER — Other Ambulatory Visit: Payer: Self-pay | Admitting: Nurse Practitioner

## 2018-12-08 DIAGNOSIS — M797 Fibromyalgia: Secondary | ICD-10-CM

## 2018-12-08 NOTE — Telephone Encounter (Signed)
Please advise.  Pt seen 12/05/2018

## 2018-12-13 ENCOUNTER — Telehealth: Payer: Self-pay | Admitting: Nurse Practitioner

## 2018-12-13 DIAGNOSIS — R3915 Urgency of urination: Secondary | ICD-10-CM

## 2018-12-13 DIAGNOSIS — G43009 Migraine without aura, not intractable, without status migrainosus: Secondary | ICD-10-CM

## 2018-12-13 DIAGNOSIS — I10 Essential (primary) hypertension: Secondary | ICD-10-CM

## 2018-12-13 DIAGNOSIS — M797 Fibromyalgia: Secondary | ICD-10-CM

## 2018-12-13 DIAGNOSIS — G2581 Restless legs syndrome: Secondary | ICD-10-CM

## 2018-12-13 DIAGNOSIS — K219 Gastro-esophageal reflux disease without esophagitis: Secondary | ICD-10-CM

## 2018-12-13 DIAGNOSIS — E782 Mixed hyperlipidemia: Secondary | ICD-10-CM

## 2018-12-13 MED ORDER — TOVIAZ 8 MG PO TB24: 8.0000 mg | ORAL_TABLET | Freq: Every day | ORAL | 1 refills | Status: DC

## 2018-12-13 MED ORDER — PANTOPRAZOLE SODIUM 40 MG PO TBEC: 40.0000 mg | DELAYED_RELEASE_TABLET | Freq: Every day | ORAL | 1 refills | Status: DC

## 2018-12-13 MED ORDER — ATORVASTATIN CALCIUM 40 MG PO TABS: 40.0000 mg | ORAL_TABLET | Freq: Every day | ORAL | 1 refills | Status: DC

## 2018-12-13 MED ORDER — TOPIRAMATE 50 MG PO TABS: 50.0000 mg | ORAL_TABLET | Freq: Three times a day (TID) | ORAL | 1 refills | Status: DC

## 2018-12-13 MED ORDER — DULOXETINE HCL 60 MG PO CPEP: 60.0000 mg | ORAL_CAPSULE | Freq: Every day | ORAL | 1 refills | Status: DC

## 2018-12-13 MED ORDER — LISINOPRIL-HYDROCHLOROTHIAZIDE 20-12.5 MG PO TABS: 2.0000 | ORAL_TABLET | Freq: Every day | ORAL | 1 refills | Status: DC

## 2018-12-13 MED ORDER — OMEPRAZOLE 40 MG PO CPDR: 40.0000 mg | DELAYED_RELEASE_CAPSULE | Freq: Every day | ORAL | 1 refills | Status: DC

## 2018-12-13 MED ORDER — PRAMIPEXOLE DIHYDROCHLORIDE 1 MG PO TABS: 1.0000 mg | ORAL_TABLET | Freq: Two times a day (BID) | ORAL | 1 refills | Status: DC

## 2018-12-13 NOTE — Telephone Encounter (Signed)
[facility]

## 2018-12-13 NOTE — Telephone Encounter (Signed)
All rx except ambien and tramadol [facility]. The rx were sent in error. Please fix.

## 2019-02-09 ENCOUNTER — Telehealth: Payer: Self-pay | Admitting: Nurse Practitioner

## 2019-02-09 ENCOUNTER — Other Ambulatory Visit: Payer: Self-pay

## 2019-02-09 DIAGNOSIS — M797 Fibromyalgia: Secondary | ICD-10-CM

## 2019-02-09 MED ORDER — BACLOFEN 10 MG PO TABS: 10.0000 mg | ORAL_TABLET | Freq: Three times a day (TID) | ORAL | 0 refills | Status: DC

## 2019-02-09 NOTE — Telephone Encounter (Signed)
What is the name of the medication? Baclofen 10 mg needs to send a new RX to [facility] and she needs a 30 day RX called into [facility] in [place]. She is out.  Have you contacted your pharmacy to request a refill? Yes  Which pharmacy would you like this sent to? [facility] and 30 day to [facility] in [place]   Patient notified that their request is being sent to the clinical staff for review and that they should receive a call once it is complete. If they do not receive a call within 24 hours they can check with their pharmacy or our office.

## 2019-02-09 NOTE — Telephone Encounter (Signed)
[facility] has refills to send. Sent 30 day supply to [facility] per patients request. Patient notified

## 2019-03-02 ENCOUNTER — Other Ambulatory Visit: Payer: Self-pay | Admitting: Nurse Practitioner

## 2019-03-02 DIAGNOSIS — F5101 Primary insomnia: Secondary | ICD-10-CM

## 2019-03-02 MED ORDER — ZOLPIDEM TARTRATE 10 MG PO TABS: 10.0000 mg | ORAL_TABLET | Freq: Every day | ORAL | 0 refills | Status: DC

## 2019-03-02 NOTE — Telephone Encounter (Signed)
ambien refilled needs to be sen for follow up

## 2019-03-02 NOTE — Telephone Encounter (Signed)
Patient aware and will call back to set up an appt

## 2019-03-06 ENCOUNTER — Other Ambulatory Visit: Payer: Self-pay

## 2019-03-07 ENCOUNTER — Ambulatory Visit (INDEPENDENT_AMBULATORY_CARE_PROVIDER_SITE_OTHER): Admitting: Nurse Practitioner

## 2019-03-07 ENCOUNTER — Encounter: Payer: Self-pay | Admitting: Nurse Practitioner

## 2019-03-07 ENCOUNTER — Other Ambulatory Visit: Payer: Self-pay

## 2019-03-07 VITALS — BP 145/89 | HR 69 | Temp 97.3°F | Resp 16 | Ht 61.0 in | Wt 275.0 lb

## 2019-03-07 DIAGNOSIS — I10 Essential (primary) hypertension: Secondary | ICD-10-CM

## 2019-03-07 DIAGNOSIS — E782 Mixed hyperlipidemia: Secondary | ICD-10-CM | POA: Diagnosis not present

## 2019-03-07 DIAGNOSIS — M797 Fibromyalgia: Secondary | ICD-10-CM

## 2019-03-07 DIAGNOSIS — G2581 Restless legs syndrome: Secondary | ICD-10-CM

## 2019-03-07 DIAGNOSIS — F5101 Primary insomnia: Secondary | ICD-10-CM

## 2019-03-07 DIAGNOSIS — F3342 Major depressive disorder, recurrent, in full remission: Secondary | ICD-10-CM

## 2019-03-07 DIAGNOSIS — R3915 Urgency of urination: Secondary | ICD-10-CM

## 2019-03-07 DIAGNOSIS — G43009 Migraine without aura, not intractable, without status migrainosus: Secondary | ICD-10-CM

## 2019-03-07 DIAGNOSIS — K219 Gastro-esophageal reflux disease without esophagitis: Secondary | ICD-10-CM | POA: Diagnosis not present

## 2019-03-07 MED ORDER — PANTOPRAZOLE SODIUM 40 MG PO TBEC: 40.0000 mg | DELAYED_RELEASE_TABLET | Freq: Every day | ORAL | 1 refills | Status: DC

## 2019-03-07 MED ORDER — TRAMADOL HCL 50 MG PO TABS: 50.0000 mg | ORAL_TABLET | Freq: Three times a day (TID) | ORAL | 0 refills | Status: DC | PRN

## 2019-03-07 MED ORDER — LISINOPRIL-HYDROCHLOROTHIAZIDE 20-12.5 MG PO TABS: 2.0000 | ORAL_TABLET | Freq: Every day | ORAL | 1 refills | Status: DC

## 2019-03-07 MED ORDER — OMEPRAZOLE 40 MG PO CPDR: 40.0000 mg | DELAYED_RELEASE_CAPSULE | Freq: Every day | ORAL | 1 refills | Status: DC

## 2019-03-07 MED ORDER — TOPIRAMATE 50 MG PO TABS: 50.0000 mg | ORAL_TABLET | Freq: Three times a day (TID) | ORAL | 1 refills | Status: DC

## 2019-03-07 MED ORDER — BACLOFEN 10 MG PO TABS: 10.0000 mg | ORAL_TABLET | Freq: Three times a day (TID) | ORAL | 0 refills | Status: DC

## 2019-03-07 MED ORDER — TOVIAZ 8 MG PO TB24: 8.0000 mg | ORAL_TABLET | Freq: Every day | ORAL | 1 refills | Status: DC

## 2019-03-07 MED ORDER — GABAPENTIN 300 MG PO CAPS: 300.0000 mg | ORAL_CAPSULE | Freq: Three times a day (TID) | ORAL | 1 refills | Status: DC

## 2019-03-07 MED ORDER — ATORVASTATIN CALCIUM 40 MG PO TABS: 40.0000 mg | ORAL_TABLET | Freq: Every day | ORAL | 1 refills | Status: DC

## 2019-03-07 MED ORDER — PRAMIPEXOLE DIHYDROCHLORIDE 1 MG PO TABS: 1.0000 mg | ORAL_TABLET | Freq: Two times a day (BID) | ORAL | 1 refills | Status: DC

## 2019-03-07 MED ORDER — DULOXETINE HCL 60 MG PO CPEP: 60.0000 mg | ORAL_CAPSULE | Freq: Every day | ORAL | 1 refills | Status: DC

## 2019-03-07 MED ORDER — ZOLPIDEM TARTRATE 10 MG PO TABS: 10.0000 mg | ORAL_TABLET | Freq: Every day | ORAL | 5 refills | Status: DC

## 2019-03-07 NOTE — Addendum Note (Signed)
Addended by: Chevis Pretty on: 03/07/2019 12:43 PM   Modules accepted: Orders

## 2019-03-07 NOTE — Addendum Note (Signed)
Addended by: Chevis Pretty on: 03/07/2019 12:44 PM   Modules accepted: Orders

## 2019-03-07 NOTE — Progress Notes (Signed)
Subjective:    Patient ID: Marie Carter, female    DOB: 05-Oct-1960, 58 y.o.   MRN: [id number]   Chief Complaint: Medical Management of Chronic Issues    HPI:  1. Essential hypertension No c/o chest pain, sob or headache. Does not chck blood pressure at home. BP Readings from Last 3 Encounters:  03/07/19 (!) 145/89  06/02/18 124/82  02/28/18 (!) 142/88     2. Mixed hyperlipidemia Does not watch diet and does no exercise. Takes lipitor daily Lab Results  Component Value Date   CHOL 217 (H) 06/02/2018   HDL 53 06/02/2018   LDLCALC 147 (H) 06/02/2018   TRIG 85 06/02/2018   CHOLHDL 4.1 06/02/2018     3. Gastroesophageal reflux disease without esophagitis Is on daily omeprazole and protonix daily and is doing well.  4. Recurrent major depressive disorder, in full remission ([place]) Is on cymbalta. Is little more depressed because her daughter moved to [place] last week to go to school. Depression screen [facility] 2/9 03/07/2019 12/05/2018 09/01/2018  Decreased Interest 0 1 0  Down, Depressed, Hopeless 0 1 0  PHQ - 2 Score 0 2 0  Altered sleeping - - -  Tired, decreased energy - - -  Change in appetite - - -  Feeling bad or failure about yourself  - - -  Trouble concentrating - - -  Moving slowly or fidgety/restless - - -  Suicidal thoughts - - -  PHQ-9 Score - - -  Difficult doing work/chores - - -  Some recent data might be hidden     5. Primary insomnia She takes ambien at night to sleep. Sleeps well.  6. RLS (restless legs syndrome) Is on mirapex at bedtime which works well to keep legs calm at night.  7. Fibromyalgia Pain assessment: Cause of pain- fibromyalgia Pain location- different ares daily Pain on scale of 1-10- 7/10 today Frequency- daily What increases pain-to much activity What makes pain Better-rest helps Effects on ADL - able to do what she has to Any change in general medical condition-none  Current opioids rx- ultram 50mg  3x a day # meds rx- 90  Effectiveness of current meds-helps with pain Adverse reactions form pain meds-none Morphine equivalent- 15 MEDD  Pill count performed-No Last drug screen - never ( high risk q47m, moderate risk q23m, low risk yearly ) Urine drug screen today- Yes Was the [place] reviewed- yes  If yes were their any concerning findings? - no     Pain contract signed on: 03/07/19   8. Severe obesity (BMI >= 40) (HCC) Weight is up 8lbs since last visit  Wt Readings from Last 3 Encounters:  03/07/19 275 lb (124.7 kg)  11/25/17 267 lb (121.1 kg)  08/17/17 264 lb (119.7 kg)   BMI Readings from Last 3 Encounters:  03/07/19 51.96 kg/m  06/02/18 50.45 kg/m  11/25/17 50.45 kg/m       Outpatient Encounter Medications as of 03/07/2019  Medication Sig  . atorvastatin (LIPITOR) 40 MG tablet Take 1 tablet (40 mg total) by mouth daily.  . baclofen (LIORESAL) 10 MG tablet Take 1 tablet (10 mg total) by mouth 3 (three) times daily.  . DULoxetine (CYMBALTA) 60 MG capsule Take 1 capsule (60 mg total) by mouth daily.  [name] EPINEPHrine 0.3 mg/0.3 mL IJ SOAJ injection Inject 0.3 mLs (0.3 mg total) into the muscle once.  . fesoterodine (TOVIAZ) 8 MG TB24 tablet Take 1 tablet (8 mg total) by mouth daily.  [name] gabapentin (NEURONTIN)  300 MG capsule Take 1 capsule (300 mg total) by mouth 3 (three) times daily.  [name] lisinopril-hydrochlorothiazide (ZESTORETIC) 20-12.5 MG tablet Take 2 tablets by mouth daily.  . medroxyPROGESTERone (PROVERA) 10 MG tablet TAKE 1 TABLET DAILY  . omeprazole (PRILOSEC) 40 MG capsule Take 1 capsule (40 mg total) by mouth daily.  . pantoprazole (PROTONIX) 40 MG tablet Take 1 tablet (40 mg total) by mouth daily.  . pramipexole (MIRAPEX) 1 MG tablet Take 1 tablet (1 mg total) by mouth 2 (two) times daily.  [name] PROAIR HFA 108 (90 Base) MCG/ACT inhaler USE 2 INHALATIONS EVERY 6 HOURS AS NEEDED FOR WHEEZING  . topiramate (TOPAMAX) 50 MG tablet Take 1 tablet (50 mg total) by mouth 3 (three) times daily.   [name] zolpidem (AMBIEN) 10 MG tablet Take 1 tablet (10 mg total) by mouth at bedtime.  . traMADol (ULTRAM) 50 MG tablet Take 1 tablet (50 mg total) by mouth every 8 (eight) hours as needed.  . traMADol (ULTRAM) 50 MG tablet Take 1 tablet (50 mg total) by mouth every 8 (eight) hours as needed.  . traMADol (ULTRAM) 50 MG tablet Take 1 tablet (50 mg total) by mouth every 8 (eight) hours as needed.     Past Surgical History:  Procedure Laterality Date  . BREAST SURGERY     biopsy times 2  . JOINT REPLACEMENT Right   . JOINT REPLACEMENT Left   . SPINE SURGERY     cervical spine    Family History  Problem Relation Age of Onset  . Diabetes Mother   . Heart attack Father 38  . Diabetes Father   . Stroke Maternal Grandmother   . Colon cancer Unknown        family history     New complaints: None today  Social history: Lives with husband- her last child just moved out of house  Controlled substance contract: 03/07/19    Review of Systems  Constitutional: Negative for activity change and appetite change.  HENT: Negative.   Eyes: Negative for pain.  Respiratory: Negative for shortness of breath.   Cardiovascular: Negative for chest pain, palpitations and leg swelling.  Gastrointestinal: Negative for abdominal pain.  Endocrine: Negative for polydipsia.  Genitourinary: Negative.   Musculoskeletal: Positive for arthralgias, back pain, gait problem and myalgias.  Skin: Negative for rash.  Neurological: Negative for dizziness, weakness and headaches.  Hematological: Does not bruise/bleed easily.  Psychiatric/Behavioral: Negative.   All other systems reviewed and are negative.      Objective:   Physical Exam Vitals signs and nursing note reviewed.  Constitutional:      General: She is not in acute distress.    Appearance: Normal appearance. She is well-developed.     Comments: Unable to get on exam table  HENT:     Head: Normocephalic.     Nose: Nose normal.  Eyes:      Pupils: Pupils are equal, round, and reactive to light.  Neck:     Musculoskeletal: Normal range of motion and neck supple.     Vascular: No carotid bruit or JVD.  Cardiovascular:     Rate and Rhythm: Normal rate and regular rhythm.     Heart sounds: Normal heart sounds.  Pulmonary:     Effort: Pulmonary effort is normal. No respiratory distress.     Breath sounds: Normal breath sounds. No wheezing or rales.  Chest:     Chest wall: No tenderness.  Abdominal:     General: Bowel sounds  are normal. There is no distension or abdominal bruit.     Palpations: Abdomen is soft. There is no hepatomegaly, splenomegaly, mass or pulsatile mass.     Tenderness: There is no abdominal tenderness.  Musculoskeletal: Normal range of motion.     Comments: Using walker to get around  Lymphadenopathy:     Cervical: No cervical adenopathy.  Skin:    General: Skin is warm and dry.  Neurological:     Mental Status: She is alert and oriented to person, place, and time.     Deep Tendon Reflexes: Reflexes are normal and symmetric.  Psychiatric:        Behavior: Behavior normal.        Thought Content: Thought content normal.        Judgment: Judgment normal.    BP (!) 145/89   Pulse 69   Temp (!) 97.3 F (36.3 C) (Temporal)   Resp 16   Ht 5\' 1"  (1.549 m)   Wt 275 lb (124.7 kg)   LMP 09/13/2012   SpO2 98%   BMI 51.96 kg/m         Assessment & Plan:  Wilson Singeramela Rudnicki comes in today with chief complaint of Medical Management of Chronic Issues   Diagnosis and orders addressed:  1. Essential hypertension Low sodium diet - lisinopril-hydrochlorothiazide (ZESTORETIC) 20-12.5 MG tablet; Take 2 tablets by mouth daily.  Dispense: 180 tablet; Refill: 1  2. Mixed hyperlipidemia Low fat det - atorvastatin (LIPITOR) 40 MG tablet; Take 1 tablet (40 mg total) by mouth daily.  Dispense: 90 tablet; Refill: 1  3. Gastroesophageal reflux disease without esophagitis Avoid spicy foods Do not eat 2 hours  prior to bedtime - pantoprazole (PROTONIX) 40 MG tablet; Take 1 tablet (40 mg total) by mouth daily.  Dispense: 90 tablet; Refill: 1 - omeprazole (PRILOSEC) 40 MG capsule; Take 1 capsule (40 mg total) by mouth daily.  Dispense: 90 capsule; Refill: 1  4. Recurrent major depressive disorder, in full remission (HCC) Stress management  5. Primary insomnia Bedtime routine - zolpidem (AMBIEN) 10 MG tablet; Take 1 tablet (10 mg total) by mouth at bedtime.  Dispense: 30 tablet; Refill: 5  6. RLS (restless legs syndrome) Keep legs warm at night - pramipexole (MIRAPEX) 1 MG tablet; Take 1 tablet (1 mg total) by mouth 2 (two) times daily.  Dispense: 180 tablet; Refill: 1  7. Fibromyalgia Keep moving to keep muscles warm - baclofen (LIORESAL) 10 MG tablet; Take 1 tablet (10 mg total) by mouth 3 (three) times daily.  Dispense: 90 tablet; Refill: 0 - DULoxetine (CYMBALTA) 60 MG capsule; Take 1 capsule (60 mg total) by mouth daily.  Dispense: 90 capsule; Refill: 1 - gabapentin (NEURONTIN) 300 MG capsule; Take 1 capsule (300 mg total) by mouth 3 (three) times daily.  Dispense: 540 capsule; Refill: 1  8. Severe obesity (BMI >= 40) (HCC) Discussed diet and exercise for person with BMI >25 Will recheck weight in 3-6 months  9. Migraine without aura and without status migrainosus, not intractable - topiramate (TOPAMAX) 50 MG tablet; Take 1 tablet (50 mg total) by mouth 3 (three) times daily.  Dispense: 270 tablet; Refill: 1  10. Urinary urgency - fesoterodine (TOVIAZ) 8 MG TB24 tablet; Take 1 tablet (8 mg total) by mouth daily.  Dispense: 90 tablet; Refill: 1   Labs pending Health Maintenance reviewed Diet and exercise encouraged  Follow up plan: 3 months   Mary-Margaret Daphine DeutscherMartin, FNP

## 2019-03-07 NOTE — Patient Instructions (Signed)

## 2019-03-08 LAB — CMP14+EGFR
ALT: 28 IU/L (ref 0–32)
AST: 18 IU/L (ref 0–40)
Albumin/Globulin Ratio: 1.5 (ref 1.2–2.2)
Albumin: 4.1 g/dL (ref 3.8–4.9)
Alkaline Phosphatase: 155 IU/L — ABNORMAL HIGH (ref 39–117)
BUN/Creatinine Ratio: 22 (ref 9–23)
BUN: 22 mg/dL (ref 6–24)
Bilirubin Total: 0.3 mg/dL (ref 0.0–1.2)
CO2: 26 mmol/L (ref 20–29)
Calcium: 8.4 mg/dL — ABNORMAL LOW (ref 8.7–10.2)
Chloride: 103 mmol/L (ref 96–106)
Creatinine, Ser: 0.99 mg/dL (ref 0.57–1.00)
GFR calc Af Amer: 72 mL/min/{1.73_m2} (ref >59)
GFR calc non Af Amer: 63 mL/min/{1.73_m2} (ref >59)
Globulin, Total: 2.8 g/dL (ref 1.5–4.5)
Glucose: 89 mg/dL (ref 65–99)
Potassium: 4.8 mmol/L (ref 3.5–5.2)
Sodium: 143 mmol/L (ref 134–144)
Total Protein: 6.9 g/dL (ref 6.0–8.5)

## 2019-03-08 LAB — LIPID PANEL
Chol/HDL Ratio: 3.8 ratio (ref 0.0–4.4)
Cholesterol, Total: 193 mg/dL (ref 100–199)
HDL: 51 mg/dL (ref >39)
LDL Chol Calc (NIH): 130 mg/dL — ABNORMAL HIGH (ref 0–99)
Triglycerides: 65 mg/dL (ref 0–149)
VLDL Cholesterol Cal: 12 mg/dL (ref 5–40)

## 2019-03-10 LAB — TOXASSURE SELECT 13 (MW), URINE

## 2019-05-24 ENCOUNTER — Encounter: Payer: Self-pay | Admitting: Family Medicine

## 2019-05-24 ENCOUNTER — Ambulatory Visit (INDEPENDENT_AMBULATORY_CARE_PROVIDER_SITE_OTHER): Admitting: Family Medicine

## 2019-05-24 DIAGNOSIS — J069 Acute upper respiratory infection, unspecified: Secondary | ICD-10-CM

## 2019-05-24 MED ORDER — HYDROCODONE-HOMATROPINE 5-1.5 MG/5ML PO SYRP: 5.0000 mL | ORAL_SOLUTION | Freq: Four times a day (QID) | ORAL | 0 refills | Status: DC | PRN

## 2019-05-24 NOTE — Patient Instructions (Signed)
This information is directly available on the CDC website: https://www.cdc.gov/coronavirus/2019-ncov/if-you-are-sick/steps-when-sick.html    Source:CDC Reference to specific commercial products, manufacturers, companies, or trademarks does not constitute its endorsement or recommendation by the U.S. Government, Department of Health and Human Services, or Centers for Disease Control and Prevention.  

## 2019-05-24 NOTE — Progress Notes (Signed)
Virtual Visit via Telephone Note  I connected with Marie Carter on 05/24/19 at 4:03 PM by telephone and verified that I am speaking with the correct person using two identifiers. Marie Carter is currently located at home and nobody is currently with her during this visit. The provider, Loman Brooklyn, FNP is located in their office at time of visit.  I discussed the limitations, risks, security and privacy concerns of performing an evaluation and management service by telephone and the availability of in person appointments. I also discussed with the patient that there may be a patient responsible charge related to this service. The patient expressed understanding and agreed to proceed.  Subjective: PCP: Chevis Pretty, FNP  Chief Complaint  Patient presents with  . Cough   Patient complains of cough. Additional symptoms include chest congestion, headache, runny nose, sneezing, fever, shortness of breath, wheezing and diarrhea. Onset of symptoms was 1 week ago, unchanged since that time. She is drinking plenty of fluids. Evaluation to date: none. Treatment to date: Robitussin Cold & Flu and Aleve. She has a history of asthma. She does not smoke. Patient has had recent close contact with someone who has tested positive for COVID-19 a week and a half ago.    ROS: Per HPI  Current Outpatient Medications:  .  atorvastatin (LIPITOR) 40 MG tablet, Take 1 tablet (40 mg total) by mouth daily., Disp: 90 tablet, Rfl: 1 .  baclofen (LIORESAL) 10 MG tablet, Take 1 tablet (10 mg total) by mouth 3 (three) times daily., Disp: 90 tablet, Rfl: 0 .  DULoxetine (CYMBALTA) 60 MG capsule, Take 1 capsule (60 mg total) by mouth daily., Disp: 90 capsule, Rfl: 1 .  EPINEPHrine 0.3 mg/0.3 mL IJ SOAJ injection, Inject 0.3 mLs (0.3 mg total) into the muscle once., Disp: 2 Device, Rfl: 2 .  fesoterodine (TOVIAZ) 8 MG TB24 tablet, Take 1 tablet (8 mg total) by mouth daily., Disp: 90 tablet, Rfl: 1 .   gabapentin (NEURONTIN) 300 MG capsule, Take 1 capsule (300 mg total) by mouth 3 (three) times daily., Disp: 540 capsule, Rfl: 1 .  lisinopril-hydrochlorothiazide (ZESTORETIC) 20-12.5 MG tablet, Take 2 tablets by mouth daily., Disp: 180 tablet, Rfl: 1 .  medroxyPROGESTERone (PROVERA) 10 MG tablet, TAKE 1 TABLET DAILY, Disp: 30 tablet, Rfl: 4 .  omeprazole (PRILOSEC) 40 MG capsule, Take 1 capsule (40 mg total) by mouth daily., Disp: 90 capsule, Rfl: 1 .  pantoprazole (PROTONIX) 40 MG tablet, Take 1 tablet (40 mg total) by mouth daily., Disp: 90 tablet, Rfl: 1 .  pramipexole (MIRAPEX) 1 MG tablet, Take 1 tablet (1 mg total) by mouth 2 (two) times daily., Disp: 180 tablet, Rfl: 1 .  PROAIR HFA 108 (90 Base) MCG/ACT inhaler, USE 2 INHALATIONS EVERY 6 HOURS AS NEEDED FOR WHEEZING, Disp: 51 g, Rfl: 4 .  topiramate (TOPAMAX) 50 MG tablet, Take 1 tablet (50 mg total) by mouth 3 (three) times daily., Disp: 270 tablet, Rfl: 1 .  traMADol (ULTRAM) 50 MG tablet, Take 1 tablet (50 mg total) by mouth every 8 (eight) hours as needed., Disp: 90 tablet, Rfl: 0 .  traMADol (ULTRAM) 50 MG tablet, Take 1 tablet (50 mg total) by mouth every 8 (eight) hours as needed., Disp: 90 tablet, Rfl: 0 .  traMADol (ULTRAM) 50 MG tablet, Take 1 tablet (50 mg total) by mouth every 8 (eight) hours as needed., Disp: 90 tablet, Rfl: 0 .  zolpidem (AMBIEN) 10 MG tablet, Take 1 tablet (10 mg total)  by mouth at bedtime., Disp: 30 tablet, Rfl: 5  Allergies  Allergen Reactions  . Bee Pollen   . Butrans [Buprenorphine] Nausea And Vomiting    patch  . Coconut Oil   . Mucinex [Guaifenesin Er] Rash   Past Medical History:  Diagnosis Date  . Abnormal Pap smear of vagina   . Allergy to bee sting   . Anemia   . Arthritis of knee    bilateral    . Asthma    mild ; intermittent   . Chronic pain   . Chronic pain syndrome 08/22/10  . Colon polyps   . CVA (cerebral vascular accident) (HCC)   . Depression   . Falls frequently 04/10/10    sultures in index finger   . Fibromyalgia 08/22/10  . GDM (gestational diabetes mellitus)   . GERD (gastroesophageal reflux disease)   . History of cold sores 12/11  . Hyperlipidemia   . Hypertension 08/22/10  . Insomnia 08/22/10  . Lumbar radiculopathy 08/22/10  . Neuropathy 08/22/10  . NSVD (normal spontaneous vaginal delivery)    x3  . Peripheral neuropathy 05/23/10   bilateral feet   . Respiratory abnormality, unspecified    allergies   . Stomatitis 12/11    Observations/Objective: A&O  No respiratory distress or wheezing audible over the phone. Does cough frequently.  Mood, judgement, and thought processes all WNL  Assessment and Plan: 1. Upper respiratory tract infection, unspecified type - Education provided on COVID-19. Discussed symptom management. Encouraged patient to go get tested for COVID-19. - HYDROcodone-homatropine (HYCODAN) 5-1.5 MG/5ML syrup; Take 5 mLs by mouth every 6 (six) hours as needed for cough.  Dispense: 120 mL; Refill: 0   Follow Up Instructions:  I discussed the assessment and treatment plan with the patient. The patient was provided an opportunity to ask questions and all were answered. The patient agreed with the plan and demonstrated an understanding of the instructions.   The patient was advised to call back or seek an in-person evaluation if the symptoms worsen or if the condition fails to improve as anticipated.  The above assessment and management plan was discussed with the patient. The patient verbalized understanding of and has agreed to the management plan. Patient is aware to call the clinic if symptoms persist or worsen. Patient is aware when to return to the clinic for a follow-up visit. Patient educated on when it is appropriate to go to the emergency department.   Time call ended: 4:13 PM  I provided 12 minutes of non-face-to-face time during this encounter.  [name], MSN, APRN, FNP-C [facility] 05/24/19

## 2019-06-01 ENCOUNTER — Other Ambulatory Visit: Payer: Self-pay | Admitting: Nurse Practitioner

## 2019-06-01 DIAGNOSIS — M797 Fibromyalgia: Secondary | ICD-10-CM

## 2019-06-02 ENCOUNTER — Other Ambulatory Visit: Payer: Self-pay | Admitting: Nurse Practitioner

## 2019-06-08 ENCOUNTER — Telehealth: Payer: Self-pay | Admitting: Nurse Practitioner

## 2019-06-09 ENCOUNTER — Ambulatory Visit (INDEPENDENT_AMBULATORY_CARE_PROVIDER_SITE_OTHER): Admitting: Nurse Practitioner

## 2019-06-09 ENCOUNTER — Other Ambulatory Visit: Payer: Self-pay

## 2019-06-09 ENCOUNTER — Encounter: Payer: Self-pay | Admitting: Nurse Practitioner

## 2019-06-09 VITALS — BP 135/90 | HR 74 | Temp 97.1°F | Resp 20 | Ht 61.0 in | Wt 281.0 lb

## 2019-06-09 DIAGNOSIS — F5101 Primary insomnia: Secondary | ICD-10-CM

## 2019-06-09 DIAGNOSIS — K219 Gastro-esophageal reflux disease without esophagitis: Secondary | ICD-10-CM

## 2019-06-09 DIAGNOSIS — F3342 Major depressive disorder, recurrent, in full remission: Secondary | ICD-10-CM

## 2019-06-09 DIAGNOSIS — G43009 Migraine without aura, not intractable, without status migrainosus: Secondary | ICD-10-CM

## 2019-06-09 DIAGNOSIS — G2581 Restless legs syndrome: Secondary | ICD-10-CM

## 2019-06-09 DIAGNOSIS — I10 Essential (primary) hypertension: Secondary | ICD-10-CM

## 2019-06-09 DIAGNOSIS — M797 Fibromyalgia: Secondary | ICD-10-CM

## 2019-06-09 DIAGNOSIS — J4520 Mild intermittent asthma, uncomplicated: Secondary | ICD-10-CM

## 2019-06-09 DIAGNOSIS — E782 Mixed hyperlipidemia: Secondary | ICD-10-CM

## 2019-06-09 MED ORDER — TRAMADOL HCL 50 MG PO TABS: 50.0000 mg | ORAL_TABLET | Freq: Three times a day (TID) | ORAL | 0 refills | Status: DC | PRN

## 2019-06-09 NOTE — Progress Notes (Signed)
Subjective:    Patient ID: Marie Carter, female    DOB: Jan 29, 1961, 59 y.o.   MRN: [id number]   Chief Complaint: No chief complaint on file.    HPI:  1. Essential hypertension No c/o chest pain, sob or headache. Doe snot check blood pressure at home. BP Readings from Last 3 Encounters:  03/07/19 (!) 145/89  06/02/18 124/82  02/28/18 (!) 142/88     2. Migraine without aura and without status migrainosus, not intractable She has migraine 1-2x a month. She says that these are different then a regular headache. She takes her topamax daiy which helps kep them under control.  3. Mild intermittent chronic asthma without complication She is not on any chronic inhalers. Has not needed her albuterol in awhile.  4. Gastroesophageal reflux disease without esophagitis Is on protonix daily and says that it works well for her  5. Mixed hyperlipidemia Does not really watch diet very closely and does no exercise. Lab Results  Component Value Date   CHOL 193 03/07/2019   HDL 51 03/07/2019   LDLCALC 130 (H) 03/07/2019   TRIG 65 03/07/2019   CHOLHDL 3.8 03/07/2019     6. Fibromyalgia Hurts daily. Pain assessment: Cause of pain- fibromyalgia Pain location- varies from day today Pain on scale of 1-10- 6/10 currenlty Frequency- daily What increases pain-nothing really What makes pain Better-pain meds help Effects on ADL - none Any change in general medical condition-no  Current opioids rx- ultram BID # meds rx- 60 Effectiveness of current meds-helps  Make pain tolerable Adverse reactions form pain meds-none Morphine equivalent- 15 MEDD  Pill count performed-No Last drug screen - 03/07/19 ( high risk q36m, moderate risk q20m, low risk yearly ) Urine drug screen today- No Was the [place] reviewed- yes  If yes were their any concerning findings? - none   Pain contract signed on: 03/22/19   7. Recurrent major depressive disorder, in full remission ([place]) cymbalta helps with  depression. She is doing well. Has gotten over the death of her granddaughter and si doing well. Depression screen [facility] 2/9 06/09/2019 03/07/2019 12/05/2018  Decreased Interest 0 0 1  Down, Depressed, Hopeless 0 0 1  PHQ - 2 Score 0 0 2  Altered sleeping - - -  Tired, decreased energy - - -  Change in appetite - - -  Feeling bad or failure about yourself  - - -  Trouble concentrating - - -  Moving slowly or fidgety/restless - - -  Suicidal thoughts - - -  PHQ-9 Score - - -  Difficult doing work/chores - - -  Some recent data might be hidden     8. Primary insomnia Takes ambien to sleep at night. Says she cannot sleep without taking  9. RLS (restless legs syndrome) Is on neurontin at night which helps keep her legs form moving so much so she can sleep better.  10. Severe obesity (BMI >= 40) (HCC) No recent weight changes Wt Readings from Last 3 Encounters:  06/09/19 281 lb (127.5 kg)  03/07/19 275 lb (124.7 kg)  11/25/17 267 lb (121.1 kg)   BMI Readings from Last 3 Encounters:  06/09/19 53.09 kg/m  03/07/19 51.96 kg/m  06/02/18 50.45 kg/m       Outpatient Encounter Medications as of 06/09/2019  Medication Sig  . atorvastatin (LIPITOR) 40 MG tablet Take 1 tablet (40 mg total) by mouth daily.  . baclofen (LIORESAL) 10 MG tablet TAKE 1 TABLET THREE TIMES A DAY  .  DULoxetine (CYMBALTA) 60 MG capsule Take 1 capsule (60 mg total) by mouth daily.  [name] EPINEPHrine 0.3 mg/0.3 mL IJ SOAJ injection Inject 0.3 mLs (0.3 mg total) into the muscle once.  . fesoterodine (TOVIAZ) 8 MG TB24 tablet Take 1 tablet (8 mg total) by mouth daily.  [name] gabapentin (NEURONTIN) 300 MG capsule Take 1 capsule (300 mg total) by mouth 3 (three) times daily.  [name] HYDROcodone-homatropine (HYCODAN) 5-1.5 MG/5ML syrup Take 5 mLs by mouth every 6 (six) hours as needed for cough.  [name] lisinopril-hydrochlorothiazide (ZESTORETIC) 20-12.5 MG tablet Take 2 tablets by mouth daily.  . medroxyPROGESTERone (PROVERA) 10 MG  tablet TAKE 1 TABLET DAILY  . omeprazole (PRILOSEC) 40 MG capsule Take 1 capsule (40 mg total) by mouth daily.  . pantoprazole (PROTONIX) 40 MG tablet Take 1 tablet (40 mg total) by mouth daily.  . pramipexole (MIRAPEX) 1 MG tablet Take 1 tablet (1 mg total) by mouth 2 (two) times daily.  [name] PROAIR HFA 108 (90 Base) MCG/ACT inhaler USE 2 INHALATIONS EVERY 6 HOURS AS NEEDED FOR WHEEZING  . topiramate (TOPAMAX) 50 MG tablet Take 1 tablet (50 mg total) by mouth 3 (three) times daily.  . traMADol (ULTRAM) 50 MG tablet Take 1 tablet (50 mg total) by mouth every 8 (eight) hours as needed.  . traMADol (ULTRAM) 50 MG tablet Take 1 tablet (50 mg total) by mouth every 8 (eight) hours as needed.  . traMADol (ULTRAM) 50 MG tablet Take 1 tablet (50 mg total) by mouth every 8 (eight) hours as needed.  . zolpidem (AMBIEN) 10 MG tablet Take 1 tablet (10 mg total) by mouth at bedtime.     Past Surgical History:  Procedure Laterality Date  . BREAST SURGERY     biopsy times 2  . JOINT REPLACEMENT Right   . JOINT REPLACEMENT Left   . SPINE SURGERY     cervical spine    Family History  Problem Relation Age of Onset  . Diabetes Mother   . Heart attack Father 61  . Diabetes Father   . Stroke Maternal Grandmother   . Colon cancer Unknown        family history     New complaints: None today  Social history: Lives with her husband. Her daughter rcently moved back from [place].     Review of Systems  Constitutional: Negative for diaphoresis.  Eyes: Negative for pain.  Respiratory: Negative for shortness of breath.   Cardiovascular: Negative for chest pain, palpitations and leg swelling.  Gastrointestinal: Negative for abdominal pain.  Endocrine: Negative for polydipsia.  Musculoskeletal: Positive for myalgias.  Skin: Negative for rash.  Neurological: Negative for dizziness, weakness and headaches.  Hematological: Does not bruise/bleed easily.  All other systems reviewed and are  negative.      Objective:   Physical Exam Vitals and nursing note reviewed.  Constitutional:      General: She is not in acute distress.    Appearance: Normal appearance. She is well-developed.  HENT:     Head: Normocephalic.     Nose: Nose normal.  Eyes:     Pupils: Pupils are equal, round, and reactive to light.  Neck:     Vascular: No carotid bruit or JVD.  Cardiovascular:     Rate and Rhythm: Normal rate and regular rhythm.     Heart sounds: Normal heart sounds.  Pulmonary:     Effort: Pulmonary effort is normal. No respiratory distress.     Breath sounds: Normal breath  sounds. No wheezing or rales.  Chest:     Chest wall: No tenderness.  Abdominal:     General: Bowel sounds are normal. There is no distension or abdominal bruit.     Palpations: Abdomen is soft. There is no hepatomegaly, splenomegaly, mass or pulsatile mass.     Tenderness: There is no abdominal tenderness.  Musculoskeletal:        General: Normal range of motion.     Cervical back: Normal range of motion and neck supple.     Right lower leg: Edema (1+) present.     Left lower leg: Edema (1+) present.  Lymphadenopathy:     Cervical: No cervical adenopathy.  Skin:    General: Skin is warm and dry.  Neurological:     Mental Status: She is alert and oriented to person, place, and time.     Deep Tendon Reflexes: Reflexes are normal and symmetric.  Psychiatric:        Behavior: Behavior normal.        Thought Content: Thought content normal.        Judgment: Judgment normal.     BP 135/90   Pulse 74   Temp (!) 97.1 F (36.2 C) (Temporal)   Resp 20   Ht 5\' 1"  (1.549 m)   Wt 281 lb (127.5 kg)   LMP 09/13/2012   SpO2 97%   BMI 53.09 kg/m        Assessment & Plan:  Kinley Dozier comes in today with chief complaint of Medical Management of Chronic Issues   Diagnosis and orders addressed:  1. Essential hypertension Low sodium diet  2. Migraine without aura and without status migrainosus,  not intractable Continue topamax  3. Mild intermittent chronic asthma without complication  4. Gastroesophageal reflux disease without esophagitis Avoid spicy foods Do not eat 2 hours prior to bedtime  5. Mixed hyperlipidemia Low fat diet  6. Fibromyalgia exercise  7. Recurrent major depressive disorder, in full remission (HCC) continue cymbalta as rx Stress management  8. Primary insomnia Bedtime routine  9. RLS (restless legs syndrome) Keep legs warm at night  10. Severe obesity (BMI >= 40) (HCC) Discussed diet and exercise for person with BMI >25 Will recheck weight in 3-6 months    Labs pending Health Maintenance reviewed Diet and exercise encouraged  Follow up plan: 3 months   Mary-Margaret Wilson Singer, FNP

## 2019-06-09 NOTE — Patient Instructions (Signed)
Stress, Adult Stress is a normal reaction to life events. Stress is what you feel when life demands more than you are used to, or more than you think you can handle. Some stress can be useful, such as studying for a test or meeting a deadline at work. Stress that occurs too often or for too long can cause problems. It can affect your emotional health and interfere with relationships and normal daily activities. Too much stress can weaken your body's defense system (immune system) and increase your risk for physical illness. If you already have a medical problem, stress can make it worse. What are the causes? All sorts of life events can cause stress. An event that causes stress for one person may not be stressful for another person. Major life events, whether positive or negative, commonly cause stress. Examples include:  Losing a job or starting a new job.  Losing a loved one.  Moving to a new town or home.  Getting married or divorced.  Having a baby.  Getting injured or sick. Less obvious life events can also cause stress, especially if they occur day after day or in combination with each other. Examples include:  Working long hours.  Driving in traffic.  Caring for children.  Being in debt.  Being in a difficult relationship. What are the signs or symptoms? Stress can cause emotional symptoms, including:  Anxiety. This is feeling worried, afraid, on edge, overwhelmed, or out of control.  Anger, including irritation or impatience.  Depression. This is feeling sad, down, helpless, or guilty.  Trouble focusing, remembering, or making decisions. Stress can cause physical symptoms, including:  Aches and pains. These may affect your head, neck, back, stomach, or other areas of your body.  Tight muscles or a clenched jaw.  Low energy.  Trouble sleeping. Stress can cause unhealthy behaviors, including:  Eating to feel better (overeating) or skipping meals.  Working too  much or putting off tasks.  Smoking, drinking alcohol, or using drugs to feel better. How is this diagnosed? Stress is diagnosed through an assessment by your health care provider. He or she may diagnose this condition based on:  Your symptoms and any stressful life events.  Your medical history.  Tests to rule out other causes of your symptoms. Depending on your condition, your health care provider may refer you to a specialist for further evaluation. How is this treated?  Stress management techniques are the recommended treatment for stress. Medicine is not typically recommended for the treatment of stress. Techniques to reduce your reaction to stressful life events include:  Stress identification. Monitor yourself for symptoms of stress and identify what causes stress for you. These skills may help you to avoid or prepare for stressful events.  Time management. Set your priorities, keep a calendar of events, and learn to say no. Taking these actions can help you avoid making too many commitments. Techniques for coping with stress include:  Rethinking the problem. Try to think realistically about stressful events rather than ignoring them or overreacting. Try to find the positives in a stressful situation rather than focusing on the negatives.  Exercise. Physical exercise can release both physical and emotional tension. The key is to find a form of exercise that you enjoy and do it regularly.  Relaxation techniques. These relax the body and mind. The key is to find one or more that you enjoy and use the techniques regularly. Examples include: ? Meditation, deep breathing, or progressive relaxation techniques. ? Yoga or   tai chi. ? Biofeedback, mindfulness techniques, or journaling. ? Listening to music, being out in nature, or participating in other hobbies.  Practicing a healthy lifestyle. Eat a balanced diet, drink plenty of water, limit or avoid caffeine, and get plenty of  sleep.  Having a strong support network. Spend time with family, friends, or other people you enjoy being around. Express your feelings and talk things over with someone you trust. Counseling or talk therapy with a mental health professional may be helpful if you are having trouble managing stress on your own. Follow these instructions at home: Lifestyle   Avoid drugs.  Do not use any products that contain nicotine or tobacco, such as cigarettes, e-cigarettes, and chewing tobacco. If you need help quitting, ask your health care provider.  Limit alcohol intake to no more than 1 drink a day for nonpregnant women and 2 drinks a day for men. One drink equals 12 oz of beer, 5 oz of wine, or 1 oz of hard liquor  Do not use alcohol or drugs to relax.  Eat a balanced diet that includes fresh fruits and vegetables, whole grains, lean meats, fish, eggs, and beans, and low-fat dairy. Avoid processed foods and foods high in added fat, sugar, and salt.  Exercise at least 30 minutes on 5 or more days each week.  Get 7-8 hours of sleep each night. General instructions   Practice stress management techniques as discussed with your health care provider.  Drink enough fluid to keep your urine clear or pale yellow.  Take over-the-counter and prescription medicines only as told by your health care provider.  Keep all follow-up visits as told by your health care provider. This is important. Contact a health care provider if:  Your symptoms get worse.  You have new symptoms.  You feel overwhelmed by your problems and can no longer manage them on your own. Get help right away if:  You have thoughts of hurting yourself or others. If you ever feel like you may hurt yourself or others, or have thoughts about taking your own life, get help right away. You can go to your nearest emergency department or call:  Your local emergency services (911 in the [place]  A suicide crisis helpline, such as the  [place] at (316) 250-6172. This is open 24 hours a day. Summary  Stress is a normal reaction to life events. It can cause problems if it happens too often or for too long.  Practicing stress management techniques is the best way to treat stress.  Counseling or talk therapy with a mental health professional may be helpful if you are having trouble managing stress on your own. This information is not intended to replace advice given to you by your health care provider. Make sure you discuss any questions you have with your health care provider. Document Revised: 12/16/2018 Document Reviewed: 07/08/2016 Elsevier Patient Education  [place].

## 2019-06-09 NOTE — Addendum Note (Signed)
Addended by: Bennie Pierini on: 06/09/2019 12:46 PM   Modules accepted: Orders

## 2019-06-10 LAB — CMP14+EGFR
ALT: 28 IU/L (ref 0–32)
AST: 16 IU/L (ref 0–40)
Albumin/Globulin Ratio: 1.4 (ref 1.2–2.2)
Albumin: 3.8 g/dL (ref 3.8–4.9)
Alkaline Phosphatase: 150 IU/L — ABNORMAL HIGH (ref 39–117)
BUN/Creatinine Ratio: 24 (ref 12–28)
BUN: 20 mg/dL (ref 8–27)
Bilirubin Total: 0.3 mg/dL (ref 0.0–1.2)
CO2: 27 mmol/L (ref 20–29)
Calcium: 8 mg/dL — ABNORMAL LOW (ref 8.7–10.3)
Chloride: 107 mmol/L — ABNORMAL HIGH (ref 96–106)
Creatinine, Ser: 0.83 mg/dL (ref 0.57–1.00)
GFR calc Af Amer: 89 mL/min/{1.73_m2} (ref >59)
GFR calc non Af Amer: 77 mL/min/{1.73_m2} (ref >59)
Globulin, Total: 2.7 g/dL (ref 1.5–4.5)
Glucose: 76 mg/dL (ref 65–99)
Potassium: 4.6 mmol/L (ref 3.5–5.2)
Sodium: 144 mmol/L (ref 134–144)
Total Protein: 6.5 g/dL (ref 6.0–8.5)

## 2019-06-10 LAB — LIPID PANEL
Chol/HDL Ratio: 3.3 ratio (ref 0.0–4.4)
Cholesterol, Total: 167 mg/dL (ref 100–199)
HDL: 51 mg/dL (ref >39)
LDL Chol Calc (NIH): 95 mg/dL (ref 0–99)
Triglycerides: 118 mg/dL (ref 0–149)
VLDL Cholesterol Cal: 21 mg/dL (ref 5–40)

## 2019-07-04 ENCOUNTER — Other Ambulatory Visit: Payer: Self-pay | Admitting: Nurse Practitioner

## 2019-07-12 ENCOUNTER — Telehealth: Payer: Self-pay | Admitting: Nurse Practitioner

## 2019-07-12 NOTE — Telephone Encounter (Signed)
Pt will contact pharmacy. This was sent in on 06/09/19 for 3 separate prescriptions

## 2019-07-12 NOTE — Telephone Encounter (Signed)
What is the name of the medication? Tramadol  Have you contacted your pharmacy to request a refill? No  Which pharmacy would you like this sent to? [facility]   Patient notified that their request is being sent to the clinical staff for review and that they should receive a call once it is complete. If they do not receive a call within 24 hours they can check with their pharmacy or our office.   MMM's pt.  She is going out of town tomorrow for family emergency & she wants MMM to refill her RX. Please call pt. I told her that MMM was not here today.

## 2019-09-05 ENCOUNTER — Encounter: Payer: Self-pay | Admitting: Nurse Practitioner

## 2019-09-05 ENCOUNTER — Ambulatory Visit (INDEPENDENT_AMBULATORY_CARE_PROVIDER_SITE_OTHER): Admitting: Nurse Practitioner

## 2019-09-05 ENCOUNTER — Other Ambulatory Visit: Payer: Self-pay

## 2019-09-05 VITALS — BP 163/99 | HR 72 | Temp 98.6°F | Resp 20 | Ht 61.0 in | Wt 286.0 lb

## 2019-09-05 DIAGNOSIS — K219 Gastro-esophageal reflux disease without esophagitis: Secondary | ICD-10-CM | POA: Diagnosis not present

## 2019-09-05 DIAGNOSIS — I10 Essential (primary) hypertension: Secondary | ICD-10-CM

## 2019-09-05 DIAGNOSIS — F5101 Primary insomnia: Secondary | ICD-10-CM

## 2019-09-05 DIAGNOSIS — J4520 Mild intermittent asthma, uncomplicated: Secondary | ICD-10-CM | POA: Diagnosis not present

## 2019-09-05 DIAGNOSIS — E782 Mixed hyperlipidemia: Secondary | ICD-10-CM

## 2019-09-05 DIAGNOSIS — M797 Fibromyalgia: Secondary | ICD-10-CM

## 2019-09-05 DIAGNOSIS — F3342 Major depressive disorder, recurrent, in full remission: Secondary | ICD-10-CM

## 2019-09-05 DIAGNOSIS — G2581 Restless legs syndrome: Secondary | ICD-10-CM

## 2019-09-05 DIAGNOSIS — R3915 Urgency of urination: Secondary | ICD-10-CM

## 2019-09-05 DIAGNOSIS — G43009 Migraine without aura, not intractable, without status migrainosus: Secondary | ICD-10-CM

## 2019-09-05 MED ORDER — TRAMADOL HCL 50 MG PO TABS: 50.0000 mg | ORAL_TABLET | Freq: Three times a day (TID) | ORAL | 0 refills | Status: DC | PRN

## 2019-09-05 MED ORDER — DULOXETINE HCL 60 MG PO CPEP: 60.0000 mg | ORAL_CAPSULE | Freq: Every day | ORAL | 1 refills | Status: DC

## 2019-09-05 MED ORDER — ZOLPIDEM TARTRATE 10 MG PO TABS: 10.0000 mg | ORAL_TABLET | Freq: Every day | ORAL | 5 refills | Status: DC

## 2019-09-05 MED ORDER — PRAMIPEXOLE DIHYDROCHLORIDE 1 MG PO TABS: 1.0000 mg | ORAL_TABLET | Freq: Two times a day (BID) | ORAL | 1 refills | Status: DC

## 2019-09-05 MED ORDER — TOPIRAMATE 50 MG PO TABS: 50.0000 mg | ORAL_TABLET | Freq: Three times a day (TID) | ORAL | 1 refills | Status: DC

## 2019-09-05 MED ORDER — LISINOPRIL-HYDROCHLOROTHIAZIDE 20-12.5 MG PO TABS: 2.0000 | ORAL_TABLET | Freq: Every day | ORAL | 1 refills | Status: DC

## 2019-09-05 MED ORDER — PANTOPRAZOLE SODIUM 40 MG PO TBEC: 40.0000 mg | DELAYED_RELEASE_TABLET | Freq: Every day | ORAL | 1 refills | Status: DC

## 2019-09-05 MED ORDER — GABAPENTIN 300 MG PO CAPS: 300.0000 mg | ORAL_CAPSULE | Freq: Three times a day (TID) | ORAL | 1 refills | Status: DC

## 2019-09-05 MED ORDER — OMEPRAZOLE 40 MG PO CPDR: 40.0000 mg | DELAYED_RELEASE_CAPSULE | Freq: Every day | ORAL | 1 refills | Status: DC

## 2019-09-05 MED ORDER — ATORVASTATIN CALCIUM 40 MG PO TABS: 40.0000 mg | ORAL_TABLET | Freq: Every day | ORAL | 1 refills | Status: DC

## 2019-09-05 MED ORDER — TOVIAZ 8 MG PO TB24: 8.0000 mg | ORAL_TABLET | Freq: Every day | ORAL | 1 refills | Status: DC

## 2019-09-05 NOTE — Progress Notes (Signed)
Subjective:    Patient ID: Marie Carter, female    DOB: 1960-07-26, 59 y.o.   MRN: [id number]   Chief Complaint: Medical Management of Chronic Issues    HPI:  1. Essential hypertension Checks BP at home when she has headaches and it is usually high then. Denies chest pain, sob. Does have headaches every once in a while but not often. Is trying to watch salt intake. BP Readings from Last 3 Encounters:  09/05/19 (!) 163/99  06/09/19 135/90  03/07/19 (!) 145/89    2. Mild intermittent chronic asthma without complication Is using inhaler about every 6-8 hours for the last couple of weeks because of the change in weather.  3. Gastroesophageal reflux disease without esophagitis Has been having reflux recently. Says the medicine helps sometimes but not all the time. Has cut back on spicy foods recently which also helps reflux symptoms.  4. Mixed hyperlipidemia Does not watch fat intake. Does not exercise.  5. Fibromyalgia Has been in pain. Medication helps some if she has not done too much activity.  6. Recurrent major depressive disorder, in full remission ([place]) No issues at this time.  7. Primary insomnia Takes Lorrin Mais which helps.  8. RLS (restless legs syndrome) Has not been bothering her recently.  9. Severe obesity (BMI >= 40) (HCC) Has gained 5 pounds since last visit. Has not been dieting or doing any exercise. BMI Readings from Last 3 Encounters:  09/05/19 54.04 kg/m  06/09/19 53.09 kg/m  03/07/19 51.96 kg/m   Wt Readings from Last 3 Encounters:  09/05/19 286 lb (129.7 kg)  06/09/19 281 lb (127.5 kg)  03/07/19 275 lb (124.7 kg)    10. Migraine without aura and without status migrainosus, not intractable Last migraine was a couple of weeks ago. Takes topamax daily which helps keep them under control.    Outpatient Encounter Medications as of 09/05/2019  Medication Sig  . atorvastatin (LIPITOR) 40 MG tablet Take 1 tablet (40 mg total) by mouth daily.  .  baclofen (LIORESAL) 10 MG tablet TAKE 1 TABLET THREE TIMES A DAY  . DULoxetine (CYMBALTA) 60 MG capsule Take 1 capsule (60 mg total) by mouth daily.  [name] EPINEPHrine 0.3 mg/0.3 mL IJ SOAJ injection Inject 0.3 mLs (0.3 mg total) into the muscle once.  . fesoterodine (TOVIAZ) 8 MG TB24 tablet Take 1 tablet (8 mg total) by mouth daily.  [name] gabapentin (NEURONTIN) 300 MG capsule Take 1 capsule (300 mg total) by mouth 3 (three) times daily.  [name] HYDROcodone-homatropine (HYCODAN) 5-1.5 MG/5ML syrup Take 5 mLs by mouth every 6 (six) hours as needed for cough.  [name] lisinopril-hydrochlorothiazide (ZESTORETIC) 20-12.5 MG tablet Take 2 tablets by mouth daily.  . medroxyPROGESTERone (PROVERA) 10 MG tablet TAKE 1 TABLET DAILY  . omeprazole (PRILOSEC) 40 MG capsule Take 1 capsule (40 mg total) by mouth daily.  . pantoprazole (PROTONIX) 40 MG tablet Take 1 tablet (40 mg total) by mouth daily.  . pramipexole (MIRAPEX) 1 MG tablet Take 1 tablet (1 mg total) by mouth 2 (two) times daily.  [name] PROAIR HFA 108 (90 Base) MCG/ACT inhaler USE 2 INHALATIONS EVERY 6 HOURS AS NEEDED FOR WHEEZING  . topiramate (TOPAMAX) 50 MG tablet Take 1 tablet (50 mg total) by mouth 3 (three) times daily.  . traMADol (ULTRAM) 50 MG tablet Take 1 tablet (50 mg total) by mouth every 8 (eight) hours as needed.  . zolpidem (AMBIEN) 10 MG tablet Take 1 tablet (10 mg total) by mouth at bedtime.  [name]  traMADol (ULTRAM) 50 MG tablet Take 1 tablet (50 mg total) by mouth every 8 (eight) hours as needed.  . traMADol (ULTRAM) 50 MG tablet Take 1 tablet (50 mg total) by mouth every 8 (eight) hours as needed.   No facility-administered encounter medications on file as of 09/05/2019.    Past Surgical History:  Procedure Laterality Date  . BREAST SURGERY     biopsy times 2  . JOINT REPLACEMENT Right   . JOINT REPLACEMENT Left   . SPINE SURGERY     cervical spine    Family History  Problem Relation Age of Onset  . Diabetes Mother   . Heart attack Father  21  . Diabetes Father   . Stroke Maternal Grandmother   . Colon cancer Other        family history     New complaints: None.  Social history: Lives with husband and teenage daughter.  Controlled substance contract: Signed 03/07/19     Review of Systems  Constitutional: Negative.   HENT: Negative.   Eyes: Negative.   Respiratory: Negative.   Cardiovascular: Negative.   Gastrointestinal: Negative.   Endocrine: Negative.   Genitourinary: Negative.   Musculoskeletal: Positive for myalgias.  Skin: Negative.   Neurological: Positive for headaches.  Psychiatric/Behavioral: Positive for sleep disturbance.       Objective:   Physical Exam Vitals and nursing note reviewed.  Constitutional:      Appearance: Normal appearance.  HENT:     Head: Normocephalic.     Right Ear: Tympanic membrane normal.     Left Ear: Tympanic membrane normal.     Nose: Nose normal.     Mouth/Throat:     Mouth: Mucous membranes are moist.     Pharynx: Oropharynx is clear.  Eyes:     Pupils: Pupils are equal, round, and reactive to light.  Cardiovascular:     Rate and Rhythm: Normal rate and regular rhythm.     Pulses: Normal pulses.     Heart sounds: Normal heart sounds.  Pulmonary:     Effort: Pulmonary effort is normal.     Breath sounds: Normal breath sounds.  Abdominal:     General: Bowel sounds are normal.     Palpations: Abdomen is soft.  Musculoskeletal:        General: Normal range of motion.     Cervical back: Normal range of motion.  Skin:    General: Skin is warm and dry.     Capillary Refill: Capillary refill takes less than 2 seconds.  Neurological:     Mental Status: She is alert and oriented to person, place, and time.    BP (!) 163/99   Pulse 72   Temp 98.6 F (37 C) (Temporal)   Resp 20   Ht 5' 1"  (1.549 m)   Wt 286 lb (129.7 kg)   LMP 09/13/2012   SpO2 95%   BMI 54.04 kg/m       Assessment & Plan:  [name] comes in today with chief complaint of  Medical Management of Chronic Issues   Diagnosis and orders addressed:  1. Essential hypertension Low sodium diet. - lisinopril-hydrochlorothiazide (ZESTORETIC) 20-12.5 MG tablet; Take 2 tablets by mouth daily.  Dispense: 180 tablet; Refill: 1 - CBC with Differential/Platelet - CMP14+EGFR  2. Mild intermittent chronic asthma without complication Use albuterol inhaler PRN. Avoid irritants.  3. Gastroesophageal reflux disease without esophagitis Avoid spicy foods Do not eat 2 hours prior to bedtime - pantoprazole (PROTONIX)  40 MG tablet; Take 1 tablet (40 mg total) by mouth daily.  Dispense: 90 tablet; Refill: 1 - omeprazole (PRILOSEC) 40 MG capsule; Take 1 capsule (40 mg total) by mouth daily.  Dispense: 90 capsule; Refill: 1  4. Mixed hyperlipidemia Low fat diet. - atorvastatin (LIPITOR) 40 MG tablet; Take 1 tablet (40 mg total) by mouth daily.  Dispense: 90 tablet; Refill: 1 - Lipid panel  5. Fibromyalgia - traMADol (ULTRAM) 50 MG tablet; Take 1 tablet (50 mg total) by mouth every 8 (eight) hours as needed.  Dispense: 90 tablet; Refill: 0 - traMADol (ULTRAM) 50 MG tablet; Take 1 tablet (50 mg total) by mouth every 8 (eight) hours as needed.  Dispense: 90 tablet; Refill: 0 - traMADol (ULTRAM) 50 MG tablet; Take 1 tablet (50 mg total) by mouth every 8 (eight) hours as needed.  Dispense: 90 tablet; Refill: 0 - DULoxetine (CYMBALTA) 60 MG capsule; Take 1 capsule (60 mg total) by mouth daily.  Dispense: 90 capsule; Refill: 1 - gabapentin (NEURONTIN) 300 MG capsule; Take 1 capsule (300 mg total) by mouth 3 (three) times daily.  Dispense: 540 capsule; Refill: 1  6. Recurrent major depressive disorder, in full remission ([place]) stress management  7. Primary insomnia - zolpidem (AMBIEN) 10 MG tablet; Take 1 tablet (10 mg total) by mouth at bedtime.  Dispense: 30 tablet; Refill: 5  8. RLS (restless legs syndrome) Keep legs warm at night. - pramipexole (MIRAPEX) 1 MG tablet; Take 1  tablet (1 mg total) by mouth 2 (two) times daily.  Dispense: 180 tablet; Refill: 1  9. Severe obesity (BMI >= 40) (HCC) Discussed diet and exercise for person with BMI >25 Will recheck weight in 3-6 months  10. Migraine without aura and without status migrainosus, not intractable - topiramate (TOPAMAX) 50 MG tablet; Take 1 tablet (50 mg total) by mouth 3 (three) times daily.  Dispense: 270 tablet; Refill: 1  11. Urinary urgency - fesoterodine (TOVIAZ) 8 MG TB24 tablet; Take 1 tablet (8 mg total) by mouth daily.  Dispense: 90 tablet; Refill: 1   Labs pending Health Maintenance reviewed Diet and exercise encouraged  Follow up plan: 3 months   Mary-Margaret Hassell Done, FNP

## 2019-09-05 NOTE — Patient Instructions (Signed)

## 2019-09-06 LAB — CBC WITH DIFFERENTIAL/PLATELET
Basophils Absolute: 0.1 10*3/uL (ref 0.0–0.2)
Basos: 1 %
EOS (ABSOLUTE): 0.1 10*3/uL (ref 0.0–0.4)
Eos: 1 %
Hematocrit: 46.9 % — ABNORMAL HIGH (ref 34.0–46.6)
Hemoglobin: 15.2 g/dL (ref 11.1–15.9)
Immature Grans (Abs): 0 10*3/uL (ref 0.0–0.1)
Immature Granulocytes: 0 %
Lymphocytes Absolute: 2.4 10*3/uL (ref 0.7–3.1)
Lymphs: 29 %
MCH: 29.2 pg (ref 26.6–33.0)
MCHC: 32.4 g/dL (ref 31.5–35.7)
MCV: 90 fL (ref 79–97)
Monocytes Absolute: 0.5 10*3/uL (ref 0.1–0.9)
Monocytes: 6 %
Neutrophils Absolute: 5.4 10*3/uL (ref 1.4–7.0)
Neutrophils: 63 %
Platelets: 340 10*3/uL (ref 150–450)
RBC: 5.2 x10E6/uL (ref 3.77–5.28)
RDW: 13.3 % (ref 11.7–15.4)
WBC: 8.4 10*3/uL (ref 3.4–10.8)

## 2019-09-06 LAB — CMP14+EGFR
ALT: 42 IU/L — ABNORMAL HIGH (ref 0–32)
AST: 19 IU/L (ref 0–40)
Albumin/Globulin Ratio: 1.4 (ref 1.2–2.2)
Albumin: 4.4 g/dL (ref 3.8–4.9)
Alkaline Phosphatase: 155 IU/L — ABNORMAL HIGH (ref 39–117)
BUN/Creatinine Ratio: 15 (ref 12–28)
BUN: 15 mg/dL (ref 8–27)
Bilirubin Total: 0.3 mg/dL (ref 0.0–1.2)
CO2: 23 mmol/L (ref 20–29)
Calcium: 8.6 mg/dL — ABNORMAL LOW (ref 8.7–10.3)
Chloride: 104 mmol/L (ref 96–106)
Creatinine, Ser: 0.97 mg/dL (ref 0.57–1.00)
GFR calc Af Amer: 73 mL/min/{1.73_m2} (ref >59)
GFR calc non Af Amer: 64 mL/min/{1.73_m2} (ref >59)
Globulin, Total: 3.1 g/dL (ref 1.5–4.5)
Glucose: 93 mg/dL (ref 65–99)
Potassium: 4.8 mmol/L (ref 3.5–5.2)
Sodium: 145 mmol/L — ABNORMAL HIGH (ref 134–144)
Total Protein: 7.5 g/dL (ref 6.0–8.5)

## 2019-09-06 LAB — LIPID PANEL
Chol/HDL Ratio: 3.4 ratio (ref 0.0–4.4)
Cholesterol, Total: 212 mg/dL — ABNORMAL HIGH (ref 100–199)
HDL: 62 mg/dL (ref >39)
LDL Chol Calc (NIH): 137 mg/dL — ABNORMAL HIGH (ref 0–99)
Triglycerides: 72 mg/dL (ref 0–149)
VLDL Cholesterol Cal: 13 mg/dL (ref 5–40)

## 2019-12-12 ENCOUNTER — Encounter: Payer: Self-pay | Admitting: Nurse Practitioner

## 2019-12-12 ENCOUNTER — Other Ambulatory Visit: Payer: Self-pay

## 2019-12-12 ENCOUNTER — Ambulatory Visit (INDEPENDENT_AMBULATORY_CARE_PROVIDER_SITE_OTHER): Admitting: Nurse Practitioner

## 2019-12-12 VITALS — BP 161/97 | HR 78 | Temp 98.1°F | Resp 20 | Ht 61.0 in | Wt 294.0 lb

## 2019-12-12 DIAGNOSIS — G2581 Restless legs syndrome: Secondary | ICD-10-CM

## 2019-12-12 DIAGNOSIS — E782 Mixed hyperlipidemia: Secondary | ICD-10-CM

## 2019-12-12 DIAGNOSIS — F3342 Major depressive disorder, recurrent, in full remission: Secondary | ICD-10-CM | POA: Diagnosis not present

## 2019-12-12 DIAGNOSIS — F5101 Primary insomnia: Secondary | ICD-10-CM

## 2019-12-12 DIAGNOSIS — I10 Essential (primary) hypertension: Secondary | ICD-10-CM

## 2019-12-12 DIAGNOSIS — R3915 Urgency of urination: Secondary | ICD-10-CM

## 2019-12-12 DIAGNOSIS — K219 Gastro-esophageal reflux disease without esophagitis: Secondary | ICD-10-CM | POA: Diagnosis not present

## 2019-12-12 DIAGNOSIS — M797 Fibromyalgia: Secondary | ICD-10-CM

## 2019-12-12 DIAGNOSIS — G43009 Migraine without aura, not intractable, without status migrainosus: Secondary | ICD-10-CM

## 2019-12-12 MED ORDER — GABAPENTIN 300 MG PO CAPS: 300.0000 mg | ORAL_CAPSULE | Freq: Three times a day (TID) | ORAL | 1 refills | Status: DC

## 2019-12-12 MED ORDER — DULOXETINE HCL 60 MG PO CPEP: 60.0000 mg | ORAL_CAPSULE | Freq: Every day | ORAL | 1 refills | Status: DC

## 2019-12-12 MED ORDER — AMLODIPINE BESYLATE 5 MG PO TABS: 5.0000 mg | ORAL_TABLET | Freq: Every day | ORAL | 3 refills | Status: DC

## 2019-12-12 MED ORDER — TOPIRAMATE 50 MG PO TABS: 50.0000 mg | ORAL_TABLET | Freq: Three times a day (TID) | ORAL | 1 refills | Status: DC

## 2019-12-12 MED ORDER — ATORVASTATIN CALCIUM 40 MG PO TABS: 40.0000 mg | ORAL_TABLET | Freq: Every day | ORAL | 1 refills | Status: DC

## 2019-12-12 MED ORDER — TRAMADOL HCL 50 MG PO TABS: 50.0000 mg | ORAL_TABLET | Freq: Three times a day (TID) | ORAL | 0 refills | Status: DC | PRN

## 2019-12-12 MED ORDER — LISINOPRIL-HYDROCHLOROTHIAZIDE 20-12.5 MG PO TABS: 2.0000 | ORAL_TABLET | Freq: Every day | ORAL | 1 refills | Status: DC

## 2019-12-12 MED ORDER — PANTOPRAZOLE SODIUM 40 MG PO TBEC: 40.0000 mg | DELAYED_RELEASE_TABLET | Freq: Every day | ORAL | 1 refills | Status: DC

## 2019-12-12 MED ORDER — OMEPRAZOLE 40 MG PO CPDR: 40.0000 mg | DELAYED_RELEASE_CAPSULE | Freq: Every day | ORAL | 1 refills | Status: DC

## 2019-12-12 MED ORDER — PRAMIPEXOLE DIHYDROCHLORIDE 1 MG PO TABS: 1.0000 mg | ORAL_TABLET | Freq: Two times a day (BID) | ORAL | 1 refills | Status: DC

## 2019-12-12 MED ORDER — TOVIAZ 8 MG PO TB24: 8.0000 mg | ORAL_TABLET | Freq: Every day | ORAL | 1 refills | Status: DC

## 2019-12-12 MED ORDER — ZOLPIDEM TARTRATE 10 MG PO TABS: 10.0000 mg | ORAL_TABLET | Freq: Every day | ORAL | 5 refills | Status: DC

## 2019-12-12 NOTE — Patient Instructions (Signed)

## 2019-12-12 NOTE — Progress Notes (Signed)
Subjective:    Patient ID: Marie Carter, female    DOB: 1961-01-09, 59 y.o.   MRN: [id number]   Chief Complaint: Medical Management of Chronic Issues    HPI:  1. Essential hypertension No c/o chest pain, sob or headache. Does not check blood pressure at home. BP Readings from Last 3 Encounters:  12/12/19 (!) 161/97  09/05/19 (!) 163/99  06/09/19 135/90     2. Mixed hyperlipidemia Does not watch diet and is not able to do much exercise due to fibromyalgia. Lab Results  Component Value Date   CHOL 212 (H) 09/05/2019   HDL 62 09/05/2019   LDLCALC 137 (H) 09/05/2019   TRIG 72 09/05/2019   CHOLHDL 3.4 09/05/2019   The 10-year ASCVD risk score Mikey Bussing DC Jr., et al., 2013) is: 6.7%   Values used to calculate the score:     Age: 55 years     Sex: Female     Is Non-Hispanic African American: No     Diabetic: No     Tobacco smoker: No     Systolic Blood Pressure: 768 mmHg     Is BP treated: Yes     HDL Cholesterol: 62 mg/dL     Total Cholesterol: 212 mg/dL   3. Gastroesophageal reflux disease without esophagitis Is on omeprazole daily and protonix.  Is doing well.  4. Recurrent major depressive disorder, in full remission ([place]) Is on cymbalta which helps some with her depression. Depression screen [facility] 2/9 09/05/2019 06/09/2019 03/07/2019  Decreased Interest 0 0 0  Down, Depressed, Hopeless 0 0 0  PHQ - 2 Score 0 0 0  Altered sleeping - - -  Tired, decreased energy - - -  Change in appetite - - -  Feeling bad or failure about yourself  - - -  Trouble concentrating - - -  Moving slowly or fidgety/restless - - -  Suicidal thoughts - - -  PHQ-9 Score - - -  Difficult doing work/chores - - -  Some recent data might be hidden     5. Fibromyalgia Is on cymbalta, neurontin and ultram Pain assessment: Cause of pain- fibromyalgia Pain location- varies from day to day Pain on scale of 1-10- 7/10 Frequency- daily What increases pain-to much activity What makes pain  Better-pain meds help Effects on ADL - able to get done what she need sto get done Any change in general medical condition-none  Current opioids rx- ultram 61m 3x a day # meds rx- 90 Effectiveness of current meds-helps Adverse reactions form pain meds-none Morphine equivalent- 15MEDD  Pill count performed-No Last drug screen - 03/07/19 ( high risk q346mmoderate risk q6m3mow risk yearly ) Urine drug screen today- No [place] [facility]  If yes were their any concerning findings? - none   Overdose risk: 1  Pain contract signed on:03/22/19   6. Primary insomnia Is on ambien nightly- sleep swell  7. RLS (restless legs syndrome) Is on mirapex nightly which helps  8. Migraine without aura and without status migrainosus, not intractable Has 1-2 a month  9. Severe obesity (BMI >= 40) (HCC) Weight is up 8lbs since last viist Wt Readings from Last 3 Encounters:  12/12/19 294 lb (133.4 kg)  09/05/19 286 lb (129.7 kg)  06/09/19 281 lb (127.5 kg)    BMI Readings from Last 3 Encounters:  12/12/19 55.55 kg/m  09/05/19 54.04 kg/m  06/09/19 53.09 kg/m      Outpatient Encounter Medications as of 12/12/2019  Medication Sig  . atorvastatin (LIPITOR) 40 MG tablet Take 1 tablet (40 mg total) by mouth daily.  . baclofen (LIORESAL) 10 MG tablet TAKE 1 TABLET THREE TIMES A DAY  . DULoxetine (CYMBALTA) 60 MG capsule Take 1 capsule (60 mg total) by mouth daily.  [name] EPINEPHrine 0.3 mg/0.3 mL IJ SOAJ injection Inject 0.3 mLs (0.3 mg total) into the muscle once.  . fesoterodine (TOVIAZ) 8 MG TB24 tablet Take 1 tablet (8 mg total) by mouth daily.  [name] gabapentin (NEURONTIN) 300 MG capsule Take 1 capsule (300 mg total) by mouth 3 (three) times daily.  [name] lisinopril-hydrochlorothiazide (ZESTORETIC) 20-12.5 MG tablet Take 2 tablets by mouth daily.  . medroxyPROGESTERone (PROVERA) 10 MG tablet TAKE 1 TABLET DAILY  . omeprazole (PRILOSEC) 40 MG capsule Take 1 capsule (40 mg total) by  mouth daily.  . pantoprazole (PROTONIX) 40 MG tablet Take 1 tablet (40 mg total) by mouth daily.  . pramipexole (MIRAPEX) 1 MG tablet Take 1 tablet (1 mg total) by mouth 2 (two) times daily.  [name] PROAIR HFA 108 (90 Base) MCG/ACT inhaler USE 2 INHALATIONS EVERY 6 HOURS AS NEEDED FOR WHEEZING  . topiramate (TOPAMAX) 50 MG tablet Take 1 tablet (50 mg total) by mouth 3 (three) times daily.  [name] zolpidem (AMBIEN) 10 MG tablet Take 1 tablet (10 mg total) by mouth at bedtime.  . traMADol (ULTRAM) 50 MG tablet Take 1 tablet (50 mg total) by mouth every 8 (eight) hours as needed.  . traMADol (ULTRAM) 50 MG tablet Take 1 tablet (50 mg total) by mouth every 8 (eight) hours as needed.  . traMADol (ULTRAM) 50 MG tablet Take 1 tablet (50 mg total) by mouth every 8 (eight) hours as needed.     Past Surgical History:  Procedure Laterality Date  . BREAST SURGERY     biopsy times 2  . JOINT REPLACEMENT Right   . JOINT REPLACEMENT Left   . SPINE SURGERY     cervical spine    Family History  Problem Relation Age of Onset  . Diabetes Mother   . Heart attack Father 53  . Diabetes Father   . Stroke Maternal Grandmother   . Colon cancer Other        family history     New complaints: Having right leg cramps at night  Social history: Lives with husband- daughter is moving to [place] in august  Controlled substance contract: 03/22/19    Review of Systems  Constitutional: Negative for diaphoresis.  Eyes: Negative for pain.  Respiratory: Negative for shortness of breath.   Cardiovascular: Negative for chest pain, palpitations and leg swelling.  Gastrointestinal: Negative for abdominal pain.  Endocrine: Negative for polydipsia.  Musculoskeletal: Positive for back pain (chronic) and myalgias.  Skin: Negative for rash.  Neurological: Negative for dizziness, weakness and headaches.  Hematological: Does not bruise/bleed easily.  All other systems reviewed and are negative.      Objective:    Physical Exam Vitals and nursing note reviewed.  Constitutional:      General: She is not in acute distress.    Appearance: Normal appearance. She is well-developed.  HENT:     Head: Normocephalic.     Nose: Nose normal.  Eyes:     Pupils: Pupils are equal, round, and reactive to light.  Neck:     Vascular: No carotid bruit or JVD.  Cardiovascular:     Rate and Rhythm: Normal rate and regular rhythm.     Heart sounds:  Normal heart sounds.  Pulmonary:     Effort: Pulmonary effort is normal. No respiratory distress.     Breath sounds: Normal breath sounds. No wheezing or rales.  Chest:     Chest wall: No tenderness.  Abdominal:     General: Bowel sounds are normal. There is no distension or abdominal bruit.     Palpations: Abdomen is soft. There is no hepatomegaly, splenomegaly, mass or pulsatile mass.     Tenderness: There is no abdominal tenderness.  Musculoskeletal:        General: Normal range of motion.     Cervical back: Normal range of motion and neck supple.     Right lower leg: Edema (1+ edema) present.     Left lower leg: Edema (1+ edema) present.     Comments: Walking with walker  Lymphadenopathy:     Cervical: No cervical adenopathy.  Skin:    General: Skin is warm and dry.  Neurological:     Mental Status: She is alert and oriented to person, place, and time.     Deep Tendon Reflexes: Reflexes are normal and symmetric.  Psychiatric:        Behavior: Behavior normal.        Thought Content: Thought content normal.        Judgment: Judgment normal.     BP (!) 161/97   Pulse 78   Temp 98.1 F (36.7 C) (Temporal)   Resp 20   Ht 5' 1"  (1.549 m)   Wt 294 lb (133.4 kg)   LMP 09/13/2012   SpO2 97%   BMI 55.55 kg/m        Assessment & Plan:  Marie Carter comes in today with chief complaint of Medical Management of Chronic Issues   Diagnosis and orders addressed:  1. Essential hypertension Low sodium diet - amLODipine (NORVASC) 5 MG tablet; Take 1  tablet (5 mg total) by mouth daily.  Dispense: 90 tablet; Refill: 3 - lisinopril-hydrochlorothiazide (ZESTORETIC) 20-12.5 MG tablet; Take 2 tablets by mouth daily.  Dispense: 180 tablet; Refill: 1 - CBC with Differential/Platelet - CMP14+EGFR  2. Mixed hyperlipidemia Low fat diet - atorvastatin (LIPITOR) 40 MG tablet; Take 1 tablet (40 mg total) by mouth daily.  Dispense: 90 tablet; Refill: 1 - Lipid panel  3. Gastroesophageal reflux disease without esophagitis Avoid spicy foods Do not eat 2 hours prior to bedtime - pantoprazole (PROTONIX) 40 MG tablet; Take 1 tablet (40 mg total) by mouth daily.  Dispense: 90 tablet; Refill: 1 - omeprazole (PRILOSEC) 40 MG capsule; Take 1 capsule (40 mg total) by mouth daily.  Dispense: 90 capsule; Refill: 1  4. Recurrent major depressive disorder, in full remission ([place]) Stress management  5. Fibromyalgia Keep muscles warm - traMADol (ULTRAM) 50 MG tablet; Take 1 tablet (50 mg total) by mouth every 8 (eight) hours as needed.  Dispense: 90 tablet; Refill: 0 - traMADol (ULTRAM) 50 MG tablet; Take 1 tablet (50 mg total) by mouth every 8 (eight) hours as needed.  Dispense: 90 tablet; Refill: 0 - traMADol (ULTRAM) 50 MG tablet; Take 1 tablet (50 mg total) by mouth every 8 (eight) hours as needed.  Dispense: 90 tablet; Refill: 0 - DULoxetine (CYMBALTA) 60 MG capsule; Take 1 capsule (60 mg total) by mouth daily.  Dispense: 90 capsule; Refill: 1 - gabapentin (NEURONTIN) 300 MG capsule; Take 1 capsule (300 mg total) by mouth 3 (three) times daily.  Dispense: 540 capsule; Refill: 1  6. Primary insomnia Bedtime orutine -  zolpidem (AMBIEN) 10 MG tablet; Take 1 tablet (10 mg total) by mouth at bedtime.  Dispense: 30 tablet; Refill: 5  7. RLS (restless legs syndrome) Keep legs warm at night - pramipexole (MIRAPEX) 1 MG tablet; Take 1 tablet (1 mg total) by mouth 2 (two) times daily.  Dispense: 180 tablet; Refill: 1  8. Migraine without aura and without status  migrainosus, not intractable Avoid caffeine - topiramate (TOPAMAX) 50 MG tablet; Take 1 tablet (50 mg total) by mouth 3 (three) times daily.  Dispense: 270 tablet; Refill: 1  9. Severe obesity (BMI >= 40) (HCC) Discussed diet and exercise for person with BMI >25 Will recheck weight in 3-6 months   10. Urinary urgency - fesoterodine (TOVIAZ) 8 MG TB24 tablet; Take 1 tablet (8 mg total) by mouth daily.  Dispense: 90 tablet; Refill: 1   Labs pending Health Maintenance reviewed- will schedule mammogram on way out Diet and exercise encouraged  Follow up plan: 3 months   Mary-Margaret Hassell Done, FNP

## 2019-12-13 LAB — CMP14+EGFR
ALT: 33 IU/L — ABNORMAL HIGH (ref 0–32)
AST: 17 IU/L (ref 0–40)
Albumin/Globulin Ratio: 1.6 (ref 1.2–2.2)
Albumin: 4.5 g/dL (ref 3.8–4.9)
Alkaline Phosphatase: 144 IU/L — ABNORMAL HIGH (ref 48–121)
BUN/Creatinine Ratio: 23 (ref 12–28)
BUN: 21 mg/dL (ref 8–27)
Bilirubin Total: 0.3 mg/dL (ref 0.0–1.2)
CO2: 27 mmol/L (ref 20–29)
Calcium: 9 mg/dL (ref 8.7–10.3)
Chloride: 104 mmol/L (ref 96–106)
Creatinine, Ser: 0.93 mg/dL (ref 0.57–1.00)
GFR calc Af Amer: 77 mL/min/{1.73_m2} (ref >59)
GFR calc non Af Amer: 67 mL/min/{1.73_m2} (ref >59)
Globulin, Total: 2.9 g/dL (ref 1.5–4.5)
Glucose: 84 mg/dL (ref 65–99)
Potassium: 4.4 mmol/L (ref 3.5–5.2)
Sodium: 143 mmol/L (ref 134–144)
Total Protein: 7.4 g/dL (ref 6.0–8.5)

## 2019-12-13 LAB — LIPID PANEL
Chol/HDL Ratio: 3.6 ratio (ref 0.0–4.4)
Cholesterol, Total: 204 mg/dL — ABNORMAL HIGH (ref 100–199)
HDL: 56 mg/dL (ref >39)
LDL Chol Calc (NIH): 131 mg/dL — ABNORMAL HIGH (ref 0–99)
Triglycerides: 97 mg/dL (ref 0–149)
VLDL Cholesterol Cal: 17 mg/dL (ref 5–40)

## 2019-12-13 LAB — CBC WITH DIFFERENTIAL/PLATELET
Basophils Absolute: 0.1 10*3/uL (ref 0.0–0.2)
Basos: 1 %
EOS (ABSOLUTE): 0.1 10*3/uL (ref 0.0–0.4)
Eos: 1 %
Hematocrit: 45.7 % (ref 34.0–46.6)
Hemoglobin: 14.8 g/dL (ref 11.1–15.9)
Immature Grans (Abs): 0 10*3/uL (ref 0.0–0.1)
Immature Granulocytes: 0 %
Lymphocytes Absolute: 2.3 10*3/uL (ref 0.7–3.1)
Lymphs: 23 %
MCH: 29.4 pg (ref 26.6–33.0)
MCHC: 32.4 g/dL (ref 31.5–35.7)
MCV: 91 fL (ref 79–97)
Monocytes Absolute: 0.7 10*3/uL (ref 0.1–0.9)
Monocytes: 7 %
Neutrophils Absolute: 6.9 10*3/uL (ref 1.4–7.0)
Neutrophils: 68 %
Platelets: 329 10*3/uL (ref 150–450)
RBC: 5.03 x10E6/uL (ref 3.77–5.28)
RDW: 13 % (ref 11.7–15.4)
WBC: 10 10*3/uL (ref 3.4–10.8)

## 2020-01-04 ENCOUNTER — Telehealth: Payer: Self-pay | Admitting: Nurse Practitioner

## 2020-01-24 DIAGNOSIS — Z0289 Encounter for other administrative examinations: Secondary | ICD-10-CM

## 2020-01-25 ENCOUNTER — Other Ambulatory Visit: Payer: Self-pay | Admitting: Nurse Practitioner

## 2020-01-29 ENCOUNTER — Other Ambulatory Visit: Payer: Self-pay

## 2020-01-29 DIAGNOSIS — I10 Essential (primary) hypertension: Secondary | ICD-10-CM

## 2020-01-29 MED ORDER — AMLODIPINE BESYLATE 5 MG PO TABS: 5.0000 mg | ORAL_TABLET | Freq: Every day | ORAL | 0 refills | Status: DC

## 2020-02-11 ENCOUNTER — Other Ambulatory Visit: Payer: Self-pay | Admitting: Nurse Practitioner

## 2020-02-11 DIAGNOSIS — M797 Fibromyalgia: Secondary | ICD-10-CM

## 2020-02-14 ENCOUNTER — Telehealth: Payer: Self-pay

## 2020-02-14 DIAGNOSIS — M797 Fibromyalgia: Secondary | ICD-10-CM

## 2020-02-14 MED ORDER — TRAMADOL HCL 50 MG PO TABS: 50.0000 mg | ORAL_TABLET | Freq: Three times a day (TID) | ORAL | 0 refills | Status: DC | PRN

## 2020-02-14 NOTE — Telephone Encounter (Signed)
Per [facility] they did not receive 3rd rx for Tramadol. It is in your note but not on her med list that it was sent. Can this be sent over? Please advise

## 2020-02-14 NOTE — Telephone Encounter (Signed)
Prescription sent to pharmacy.

## 2020-03-18 ENCOUNTER — Ambulatory Visit (INDEPENDENT_AMBULATORY_CARE_PROVIDER_SITE_OTHER): Admitting: Nurse Practitioner

## 2020-03-18 ENCOUNTER — Other Ambulatory Visit: Payer: Self-pay | Admitting: Nurse Practitioner

## 2020-03-18 ENCOUNTER — Other Ambulatory Visit: Payer: Self-pay

## 2020-03-18 ENCOUNTER — Encounter: Payer: Self-pay | Admitting: Nurse Practitioner

## 2020-03-18 VITALS — BP 169/108 | HR 75 | Temp 98.3°F | Resp 20 | Ht 61.0 in | Wt 298.0 lb

## 2020-03-18 DIAGNOSIS — M797 Fibromyalgia: Secondary | ICD-10-CM

## 2020-03-18 DIAGNOSIS — Z1231 Encounter for screening mammogram for malignant neoplasm of breast: Secondary | ICD-10-CM

## 2020-03-18 DIAGNOSIS — K219 Gastro-esophageal reflux disease without esophagitis: Secondary | ICD-10-CM

## 2020-03-18 DIAGNOSIS — J4520 Mild intermittent asthma, uncomplicated: Secondary | ICD-10-CM

## 2020-03-18 DIAGNOSIS — I10 Essential (primary) hypertension: Secondary | ICD-10-CM | POA: Diagnosis not present

## 2020-03-18 DIAGNOSIS — G2581 Restless legs syndrome: Secondary | ICD-10-CM

## 2020-03-18 DIAGNOSIS — F5101 Primary insomnia: Secondary | ICD-10-CM

## 2020-03-18 DIAGNOSIS — G43009 Migraine without aura, not intractable, without status migrainosus: Secondary | ICD-10-CM | POA: Diagnosis not present

## 2020-03-18 DIAGNOSIS — E782 Mixed hyperlipidemia: Secondary | ICD-10-CM

## 2020-03-18 DIAGNOSIS — F3342 Major depressive disorder, recurrent, in full remission: Secondary | ICD-10-CM

## 2020-03-18 MED ORDER — TRAMADOL HCL 50 MG PO TABS: 50.0000 mg | ORAL_TABLET | Freq: Three times a day (TID) | ORAL | 0 refills | Status: DC | PRN

## 2020-03-18 MED ORDER — AMLODIPINE BESYLATE 5 MG PO TABS: 5.0000 mg | ORAL_TABLET | Freq: Every day | ORAL | 0 refills | Status: DC

## 2020-03-18 NOTE — Patient Instructions (Signed)

## 2020-03-18 NOTE — Addendum Note (Signed)
Addended by: Cleda Daub on: 03/18/2020 12:54 PM   Modules accepted: Orders

## 2020-03-18 NOTE — Progress Notes (Signed)
Subjective:    Patient ID: Marie Carter, female    DOB: 1960/06/18, 59 y.o.   MRN: [id number]   Chief Complaint: Medical Management of Chronic Issues    HPI:  1. Primary hypertension No c/o chest pain, sob or headache. Does  check blood pressure at home and is always below 140 systolic.[name] BP Readings from Last 3 Encounters:  03/18/20 (!) 169/108  12/12/19 (!) 161/97  09/05/19 (!) 163/99      2. Mixed hyperlipidemia Does not really watch diet and is not able to do any exercise. Lab Results  Component Value Date   CHOL 204 (H) 12/12/2019   HDL 56 12/12/2019   LDLCALC 131 (H) 12/12/2019   TRIG 97 12/12/2019   CHOLHDL 3.6 12/12/2019     3. Migraine without aura and without status migrainosus, not intractable Has one a couple of times a month. topamax has really helped the frequency.  4. Mild intermittent chronic asthma without complication Uses abuterol about 1 x per week. Does not feel short of breath. Only when she goes outside  5. Gastroesophageal reflux disease without esophagitis Is on protonix and omeprazole daily. Combination works well for her.  6. Recurrent major depressive disorder, in full remission (HCC) Is on cymbalta and ois doing well. . Depression screen [facility] 2/9 03/18/2020 09/05/2019 06/09/2019  Decreased Interest 0 0 0  Down, Depressed, Hopeless 0 0 0  PHQ - 2 Score 0 0 0  Altered sleeping 0 - -  Tired, decreased energy 0 - -  Change in appetite 0 - -  Feeling bad or failure about yourself  0 - -  Trouble concentrating 0 - -  Moving slowly or fidgety/restless 0 - -  Suicidal thoughts 0 - -  PHQ-9 Score 0 - -  Difficult doing work/chores Not difficult at all - -  Some recent data might be hidden     7. Fibromyalgia Is on ultram and neurontin daily. Pain assessment: Cause of pain- fibromyalgia Pain location- varies daily Pain on scale of 1-10- 8/10 Frequency- daily What increases pain-to much activity What makes pain Better-rest seems to  help her and staying warm Effects on ADL - none Any change in general medical condition-none  Current opioids rx- ultram # meds rx- 90 Effectiveness of current meds-helps bring that down Adverse reactions from pain meds-none Morphine equivalent-  Pill count performed-No Last drug screen - 03/07/19 ( high risk q70m, moderate risk q8m, low risk yearly ) Urine drug screen today- Yes Was the NCCSR reviewed- yes  If yes were their any concerning findings? - no   Overdose risk: 1  Pain contract signed on:03/18/20   8. Primary insomnia Takes ambin to sleep at night. Is not able to sleep without taking  9. RLS (restless legs syndrome) Is on mirapex at bedtime to keep legs calm at night  10. Severe obesity (BMI >= 40) (HCC) No recent weight changes Wt Readings from Last 3 Encounters:  03/18/20 298 lb (135.2 kg)  12/12/19 294 lb (133.4 kg)  09/05/19 286 lb (129.7 kg)   BMI Readings from Last 3 Encounters:  03/18/20 56.31 kg/m  12/12/19 55.55 kg/m  09/05/19 54.04 kg/m       Outpatient Encounter Medications as of 03/18/2020  Medication Sig  . amLODipine (NORVASC) 5 MG tablet Take 1 tablet (5 mg total) by mouth daily.  [name] atorvastatin (LIPITOR) 40 MG tablet Take 1 tablet (40 mg total) by mouth daily.  . baclofen (LIORESAL) 10 MG tablet TAKE  1 TABLET THREE TIMES A DAY  . DULoxetine (CYMBALTA) 60 MG capsule Take 1 capsule (60 mg total) by mouth daily.  [name] EPINEPHrine 0.3 mg/0.3 mL IJ SOAJ injection Inject 0.3 mLs (0.3 mg total) into the muscle once.  . fesoterodine (TOVIAZ) 8 MG TB24 tablet Take 1 tablet (8 mg total) by mouth daily.  [name] gabapentin (NEURONTIN) 300 MG capsule Take 1 capsule (300 mg total) by mouth 3 (three) times daily.  [name] lisinopril-hydrochlorothiazide (ZESTORETIC) 20-12.5 MG tablet Take 2 tablets by mouth daily.  . medroxyPROGESTERone (PROVERA) 10 MG tablet TAKE 1 TABLET DAILY  . omeprazole (PRILOSEC) 40 MG capsule Take 1 capsule (40 mg total) by mouth  daily.  . pantoprazole (PROTONIX) 40 MG tablet Take 1 tablet (40 mg total) by mouth daily.  . pramipexole (MIRAPEX) 1 MG tablet Take 1 tablet (1 mg total) by mouth 2 (two) times daily.  [name] PROAIR HFA 108 (90 Base) MCG/ACT inhaler USE 2 INHALATIONS EVERY 6 HOURS AS NEEDED FOR WHEEZING  . topiramate (TOPAMAX) 50 MG tablet Take 1 tablet (50 mg total) by mouth 3 (three) times daily.  [name] zolpidem (AMBIEN) 10 MG tablet Take 1 tablet (10 mg total) by mouth at bedtime.  . traMADol (ULTRAM) 50 MG tablet Take 1 tablet (50 mg total) by mouth every 8 (eight) hours as needed.  . traMADol (ULTRAM) 50 MG tablet Take 1 tablet (50 mg total) by mouth every 8 (eight) hours as needed.  . traMADol (ULTRAM) 50 MG tablet Take 1 tablet (50 mg total) by mouth every 8 (eight) hours as needed.   No facility-administered encounter medications on file as of 03/18/2020.    Past Surgical History:  Procedure Laterality Date  . BREAST SURGERY     biopsy times 2  . JOINT REPLACEMENT Right   . JOINT REPLACEMENT Left   . SPINE SURGERY     cervical spine    Family History  Problem Relation Age of Onset  . Diabetes Mother   . Heart attack Father 45  . Diabetes Father   . Stroke Maternal Grandmother   . Colon cancer Other        family history     New complaints: None today  Social history: Lives with her husband  Controlled substance contract: 03/22/19    Review of Systems  Constitutional: Negative for diaphoresis.  Eyes: Negative for pain.  Respiratory: Negative for shortness of breath.   Cardiovascular: Positive for leg swelling. Negative for chest pain and palpitations.  Gastrointestinal: Negative for abdominal pain.  Endocrine: Negative for polydipsia.  Musculoskeletal: Positive for arthralgias and myalgias.  Skin: Negative for rash.  Neurological: Negative for dizziness, weakness and headaches.  Hematological: Does not bruise/bleed easily.  All other systems reviewed and are negative.        Objective:   Physical Exam Vitals and nursing note reviewed.  Constitutional:      General: She is not in acute distress.    Appearance: Normal appearance. She is well-developed.  HENT:     Head: Normocephalic.     Nose: Nose normal.  Eyes:     Pupils: Pupils are equal, round, and reactive to light.  Neck:     Vascular: No carotid bruit or JVD.  Cardiovascular:     Rate and Rhythm: Normal rate and regular rhythm.     Heart sounds: Normal heart sounds.  Pulmonary:     Effort: Pulmonary effort is normal. No respiratory distress.     Breath sounds: Normal breath  sounds. No wheezing or rales.  Chest:     Chest wall: No tenderness.  Abdominal:     General: Bowel sounds are normal. There is no distension or abdominal bruit.     Palpations: Abdomen is soft. There is no hepatomegaly, splenomegaly, mass or pulsatile mass.     Tenderness: There is no abdominal tenderness.  Musculoskeletal:        General: Normal range of motion.     Cervical back: Normal range of motion and neck supple.  Lymphadenopathy:     Cervical: No cervical adenopathy.  Skin:    General: Skin is warm and dry.  Neurological:     Mental Status: She is alert and oriented to person, place, and time.     Deep Tendon Reflexes: Reflexes are normal and symmetric.  Psychiatric:        Behavior: Behavior normal.        Thought Content: Thought content normal.        Judgment: Judgment normal.    BP (!) 169/108   Pulse 75   Temp 98.3 F (36.8 C) (Temporal)   Resp 20   Ht 5\' 1"  (1.549 m)   Wt 298 lb (135.2 kg)   LMP 09/13/2012   SpO2 97%   BMI 56.31 kg/m          Assessment & Plan:  Marie Carter comes in today with chief complaint of Medical Management of Chronic Issues   Diagnosis and orders addressed:  1. Primary hypertension Low sodium diet - amLODipine (NORVASC) 5 MG tablet; Take 1 tablet (5 mg total) by mouth daily.  Dispense: 90 tablet; Refill: 0  2. Mixed hyperlipidemia Low fat diet  3.  Migraine without aura and without status migrainosus, not intractable continue topamax  4. Mild intermittent chronic asthma without complication Lets me know if becomes SOB  5. Gastroesophageal reflux disease without esophagitis .Avoid spicy foods Do not eat 2 hours prior to bedtime  6. Recurrent major depressive disorder, in full remission (HCC) *stress management  7. Fibromyalgia Keep muscles warm - traMADol (ULTRAM) 50 MG tablet; Take 1 tablet (50 mg total) by mouth every 8 (eight) hours as needed.  Dispense: 90 tablet; Refill: 0 - traMADol (ULTRAM) 50 MG tablet; Take 1 tablet (50 mg total) by mouth every 8 (eight) hours as needed.  Dispense: 90 tablet; Refill: 0 - traMADol (ULTRAM) 50 MG tablet; Take 1 tablet (50 mg total) by mouth every 8 (eight) hours as needed.  Dispense: 90 tablet; Refill: 0  8. Primary insomnia Bedtime routine  9. RLS (restless legs syndrome) Keep legs warm at night  10. Severe obesity (BMI >= 40) (HCC) Discussed diet and exercise for person with BMI >25 Will recheck weight in 3-6 months    Labs pending Health Maintenance reviewed- schedule mammogram Diet and exercise encouraged  Follow up plan: 3 months   Mary-Margaret Wilson Singer, FNP

## 2020-03-19 LAB — CMP14+EGFR
ALT: 40 IU/L — ABNORMAL HIGH (ref 0–32)
AST: 22 IU/L (ref 0–40)
Albumin/Globulin Ratio: 1.4 (ref 1.2–2.2)
Albumin: 4.3 g/dL (ref 3.8–4.9)
Alkaline Phosphatase: 127 IU/L — ABNORMAL HIGH (ref 44–121)
BUN/Creatinine Ratio: 24 (ref 12–28)
BUN: 22 mg/dL (ref 8–27)
Bilirubin Total: 0.3 mg/dL (ref 0.0–1.2)
CO2: 26 mmol/L (ref 20–29)
Calcium: 9 mg/dL (ref 8.7–10.3)
Chloride: 103 mmol/L (ref 96–106)
Creatinine, Ser: 0.91 mg/dL (ref 0.57–1.00)
GFR calc Af Amer: 79 mL/min/{1.73_m2} (ref >59)
GFR calc non Af Amer: 69 mL/min/{1.73_m2} (ref >59)
Globulin, Total: 3 g/dL (ref 1.5–4.5)
Glucose: 92 mg/dL (ref 65–99)
Potassium: 4.9 mmol/L (ref 3.5–5.2)
Sodium: 141 mmol/L (ref 134–144)
Total Protein: 7.3 g/dL (ref 6.0–8.5)

## 2020-03-19 LAB — CBC WITH DIFFERENTIAL/PLATELET
Basophils Absolute: 0.1 10*3/uL (ref 0.0–0.2)
Basos: 1 %
EOS (ABSOLUTE): 0.1 10*3/uL (ref 0.0–0.4)
Eos: 2 %
Hematocrit: 45 % (ref 34.0–46.6)
Hemoglobin: 15 g/dL (ref 11.1–15.9)
Immature Grans (Abs): 0 10*3/uL (ref 0.0–0.1)
Immature Granulocytes: 1 %
Lymphocytes Absolute: 2.2 10*3/uL (ref 0.7–3.1)
Lymphs: 34 %
MCH: 29.4 pg (ref 26.6–33.0)
MCHC: 33.3 g/dL (ref 31.5–35.7)
MCV: 88 fL (ref 79–97)
Monocytes Absolute: 0.5 10*3/uL (ref 0.1–0.9)
Monocytes: 8 %
Neutrophils Absolute: 3.6 10*3/uL (ref 1.4–7.0)
Neutrophils: 54 %
Platelets: 325 10*3/uL (ref 150–450)
RBC: 5.1 x10E6/uL (ref 3.77–5.28)
RDW: 12.9 % (ref 11.7–15.4)
WBC: 6.6 10*3/uL (ref 3.4–10.8)

## 2020-03-19 LAB — LIPID PANEL
Chol/HDL Ratio: 3.5 ratio (ref 0.0–4.4)
Cholesterol, Total: 202 mg/dL — ABNORMAL HIGH (ref 100–199)
HDL: 57 mg/dL (ref >39)
LDL Chol Calc (NIH): 132 mg/dL — ABNORMAL HIGH (ref 0–99)
Triglycerides: 74 mg/dL (ref 0–149)
VLDL Cholesterol Cal: 13 mg/dL (ref 5–40)

## 2020-03-21 ENCOUNTER — Encounter: Payer: Self-pay | Admitting: Family Medicine

## 2020-03-24 ENCOUNTER — Other Ambulatory Visit: Payer: Self-pay | Admitting: Nurse Practitioner

## 2020-03-24 DIAGNOSIS — M797 Fibromyalgia: Secondary | ICD-10-CM

## 2020-03-26 ENCOUNTER — Telehealth: Payer: Self-pay

## 2020-03-26 DIAGNOSIS — F5101 Primary insomnia: Secondary | ICD-10-CM

## 2020-03-26 DIAGNOSIS — M797 Fibromyalgia: Secondary | ICD-10-CM

## 2020-03-26 MED ORDER — TRAMADOL HCL 50 MG PO TABS: 50.0000 mg | ORAL_TABLET | Freq: Three times a day (TID) | ORAL | 0 refills | Status: DC | PRN

## 2020-03-26 MED ORDER — ZOLPIDEM TARTRATE 10 MG PO TABS: 10.0000 mg | ORAL_TABLET | Freq: Every day | ORAL | 5 refills | Status: DC

## 2020-03-26 NOTE — Telephone Encounter (Signed)
Resent ambien and tramadol nto [facility]- all other meds are good till next visit.

## 2020-03-27 ENCOUNTER — Telehealth: Payer: Self-pay

## 2020-03-27 NOTE — Telephone Encounter (Signed)
Pt called to let MMM know that she finally received her Tramadol Rx in the mail.

## 2020-05-29 ENCOUNTER — Encounter: Payer: Self-pay | Admitting: Nurse Practitioner

## 2020-05-29 ENCOUNTER — Ambulatory Visit (INDEPENDENT_AMBULATORY_CARE_PROVIDER_SITE_OTHER): Admitting: Nurse Practitioner

## 2020-05-29 ENCOUNTER — Encounter

## 2020-05-29 DIAGNOSIS — R059 Cough, unspecified: Secondary | ICD-10-CM | POA: Diagnosis not present

## 2020-05-29 MED ORDER — PREDNISONE 20 MG PO TABS: ORAL_TABLET | ORAL | 0 refills | Status: DC

## 2020-05-29 MED ORDER — BENZONATATE 100 MG PO CAPS: 100.0000 mg | ORAL_CAPSULE | Freq: Three times a day (TID) | ORAL | 0 refills | Status: DC | PRN

## 2020-05-29 NOTE — Progress Notes (Signed)
Virtual Visit via telephone Note Due to COVID-19 pandemic this visit was conducted virtually. This visit type was conducted due to national recommendations for restrictions regarding the COVID-19 Pandemic (e.g. social distancing, sheltering in place) in an effort to limit this patient's exposure and mitigate transmission in our community. All issues noted in this document were discussed and addressed.  A physical exam was not performed with this format.  I connected with Marie Carter on 05/29/20 at 11:50 by telephone and verified that I am speaking with the correct person using two identifiers. Marie Carter is currently located at home and no one is currently with her during visit. The provider, Mary-Margaret Daphine Deutscher, FNP is located in their office at time of visit.  I discussed the limitations, risks, security and privacy concerns of performing an evaluation and management service by telephone and the availability of in person appointments. I also discussed with the patient that there may be a patient responsible charge related to this service. The patient expressed understanding and agreed to proceed.   History and Present Illness:   Chief Complaint: Cough   HPI Patient calls in c/o cough that is nonproductive. She has had for 3-4 days. She has been taking robitussin OTC which helps. Denies covid exposure. Has not had vaccines.   Review of Systems  Constitutional: Negative for chills and fever.  HENT: Positive for congestion. Negative for sinus pain and sore throat.   Respiratory: Positive for cough. Negative for sputum production.   Musculoskeletal: Negative for myalgias.  Neurological: Positive for headaches. Negative for dizziness.     Observations/Objective: Alert and oriented- answers all questions appropriately No distress Voice hoarse Deep dry cough  Assessment and Plan: Marie Carter in today with chief complaint of Cough   1. Cough 1. Take meds as prescribed 2. Use  a cool mist humidifier especially during the winter months and when heat has been humid. 3. Use saline nose sprays frequently 4. Saline irrigations of the nose can be very helpful if done frequently.  * 4X daily for 1 week*  * Use of a nettie pot can be helpful with this. Follow directions with this* 5. Drink plenty of fluids 6. Keep thermostat turn down low 7.For any cough or congestion  Use plain Mucinex- regular strength or max strength is fine   * Children- consult with Pharmacist for dosing 8. For fever or aces or pains- take tylenol or ibuprofen appropriate for age and weight.  * for fevers greater than 101 orally you may alternate ibuprofen and tylenol every  3 hours.    - benzonatate (TESSALON) 100 MG capsule; Take 1 capsule (100 mg total) by mouth 3 (three) times daily as needed for cough.  Dispense: 30 capsule; Refill: 0 - predniSONE (DELTASONE) 20 MG tablet; 2 po at sametime daily for 5 days  Dispense: 10 tablet; Refill: 0     Follow Up Instructions: prn    I discussed the assessment and treatment plan with the patient. The patient was provided an opportunity to ask questions and all were answered. The patient agreed with the plan and demonstrated an understanding of the instructions.   The patient was advised to call back or seek an in-person evaluation if the symptoms worsen or if the condition fails to improve as anticipated.  The above assessment and management plan was discussed with the patient. The patient verbalized understanding of and has agreed to the management plan. Patient is aware to call the clinic if symptoms persist or worsen.  Patient is aware when to return to the clinic for a follow-up visit. Patient educated on when it is appropriate to go to the emergency department.   Time call ended:  12:05  I provided 15 minutes of non-face-to-face time during this encounter.    Mary-Margaret Daphine Deutscher, FNP

## 2020-05-29 NOTE — Addendum Note (Signed)
Addended by: Bennie Pierini on: 05/29/2020 04:06 PM   Modules accepted: Level of Service

## 2020-06-14 ENCOUNTER — Ambulatory Visit: Payer: Self-pay | Admitting: Nurse Practitioner

## 2020-06-14 NOTE — Progress Notes (Deleted)
Subjective:    Patient ID: Marie Carter, female    DOB: Dec 18, 1960, 60 y.o.   MRN: [id number]   Chief Complaint: medical management of chronic issues     HPI:  1. Primary hypertension No c/o chest pain, sob or headache. She does not check her blood pressure at home. BP Readings from Last 3 Encounters:  03/18/20 (!) 169/108  12/12/19 (!) 161/97  09/05/19 (!) 163/99     2. Mixed hyperlipidemia She does not watch her diet and does no exercise. Lab Results  Component Value Date   CHOL 202 (H) 03/18/2020   HDL 57 03/18/2020   LDLCALC 132 (H) 03/18/2020   TRIG 74 03/18/2020   CHOLHDL 3.5 03/18/2020   The 10-year ASCVD risk score Denman George DC Jr., et al., 2013) is: 8.3%   Values used to calculate the score:     Age: 37 years     Sex: Female     Is Non-Hispanic African American: No     Diabetic: No     Tobacco smoker: No     Systolic Blood Pressure: 169 mmHg     Is BP treated: Yes     HDL Cholesterol: 57 mg/dL     Total Cholesterol: 202 mg/dL   3. Migraine without aura and without status migrainosus, not intractable Takes topamax daily which helps with prevention.  4. Gastroesophageal reflux disease without esophagitis Is on protonix and omeprazole. The combination keeps her symptoms under control.  5. Recurrent major depressive disorder, in full remission (HCC) Is on cymbalta for her fibromyalgia and that alo helps her depression.  6. Primary insomnia Is on ambien at night and says she cannot sleep without it  7. RLS (restless legs syndrome) I son mirapex which works well to keep her legs calm when she is sleeping or sitting.  8. Fibromyalgia Pain assessment: Cause of pain- *** Pain location- *** Pain on scale of 1-10- *** Frequency- *** What increases pain-*** What makes pain Better-*** Effects on ADL - *** Any change in general medical condition-***  Current opioids rx- *** # meds rx- *** Effectiveness of current meds-*** Adverse reactions from pain  meds-*** Morphine equivalent- ***  Pill count performed-{yes/ no:20286} Last drug screen - *** ( high risk q53m, moderate risk q17m, low risk yearly ) Urine drug screen today- {yes/no:20286} Was the NCCSR reviewed- ***  If yes were their any concerning findings? - ***   Overdose risk: 1 No flowsheet data found.   Pain contract signed on:   9. Severe obesity (BMI >= 40) (HCC) No recent weight changes     Outpatient Encounter Medications as of 06/14/2020  Medication Sig  . amLODipine (NORVASC) 5 MG tablet Take 1 tablet (5 mg total) by mouth daily.  [name] atorvastatin (LIPITOR) 40 MG tablet Take 1 tablet (40 mg total) by mouth daily.  . baclofen (LIORESAL) 10 MG tablet TAKE 1 TABLET THREE TIMES A DAY  . benzonatate (TESSALON) 100 MG capsule Take 1 capsule (100 mg total) by mouth 3 (three) times daily as needed for cough.  . DULoxetine (CYMBALTA) 60 MG capsule Take 1 capsule (60 mg total) by mouth daily.  [name] EPINEPHrine 0.3 mg/0.3 mL IJ SOAJ injection Inject 0.3 mLs (0.3 mg total) into the muscle once.  . fesoterodine (TOVIAZ) 8 MG TB24 tablet Take 1 tablet (8 mg total) by mouth daily.  [name] gabapentin (NEURONTIN) 300 MG capsule Take 1 capsule (300 mg total) by mouth 3 (three) times daily.  [name] lisinopril-hydrochlorothiazide (ZESTORETIC) 20-12.5  MG tablet Take 2 tablets by mouth daily.  . medroxyPROGESTERone (PROVERA) 10 MG tablet TAKE 1 TABLET DAILY  . omeprazole (PRILOSEC) 40 MG capsule Take 1 capsule (40 mg total) by mouth daily.  . pantoprazole (PROTONIX) 40 MG tablet Take 1 tablet (40 mg total) by mouth daily.  . pramipexole (MIRAPEX) 1 MG tablet Take 1 tablet (1 mg total) by mouth 2 (two) times daily.  . predniSONE (DELTASONE) 20 MG tablet 2 po at sametime daily for 5 days  . PROAIR HFA 108 (90 Base) MCG/ACT inhaler USE 2 INHALATIONS EVERY 6 HOURS AS NEEDED FOR WHEEZING  . topiramate (TOPAMAX) 50 MG tablet Take 1 tablet (50 mg total) by mouth 3 (three) times daily.  . traMADol (ULTRAM)  50 MG tablet Take 1 tablet (50 mg total) by mouth every 8 (eight) hours as needed.  . traMADol (ULTRAM) 50 MG tablet Take 1 tablet (50 mg total) by mouth every 8 (eight) hours as needed.  . traMADol (ULTRAM) 50 MG tablet Take 1 tablet (50 mg total) by mouth every 8 (eight) hours as needed.  . zolpidem (AMBIEN) 10 MG tablet Take 1 tablet (10 mg total) by mouth at bedtime.   No facility-administered encounter medications on file as of 06/14/2020.    Past Surgical History:  Procedure Laterality Date  . BREAST SURGERY     biopsy times 2  . JOINT REPLACEMENT Right   . JOINT REPLACEMENT Left   . SPINE SURGERY     cervical spine    Family History  Problem Relation Age of Onset  . Diabetes Mother   . Heart attack Father 67  . Diabetes Father   . Stroke Maternal Grandmother   . Colon cancer Other        family history     New complaints: ***  Social history: ***  Controlled substance contract: ***    Review of Systems     Objective:   Physical Exam        Assessment & Plan:

## 2020-06-26 ENCOUNTER — Encounter

## 2020-06-28 ENCOUNTER — Encounter: Payer: Self-pay | Admitting: Nurse Practitioner

## 2020-07-09 ENCOUNTER — Other Ambulatory Visit: Payer: Self-pay | Admitting: Nurse Practitioner

## 2020-07-09 DIAGNOSIS — I10 Essential (primary) hypertension: Secondary | ICD-10-CM

## 2020-07-11 ENCOUNTER — Other Ambulatory Visit: Payer: Self-pay

## 2020-07-11 ENCOUNTER — Ambulatory Visit (INDEPENDENT_AMBULATORY_CARE_PROVIDER_SITE_OTHER): Admitting: Nurse Practitioner

## 2020-07-11 ENCOUNTER — Encounter: Payer: Self-pay | Admitting: Nurse Practitioner

## 2020-07-11 VITALS — BP 151/91 | HR 84 | Temp 98.1°F | Ht 61.0 in | Wt 299.4 lb

## 2020-07-11 DIAGNOSIS — G2581 Restless legs syndrome: Secondary | ICD-10-CM

## 2020-07-11 DIAGNOSIS — I10 Essential (primary) hypertension: Secondary | ICD-10-CM

## 2020-07-11 DIAGNOSIS — E782 Mixed hyperlipidemia: Secondary | ICD-10-CM | POA: Diagnosis not present

## 2020-07-11 DIAGNOSIS — K219 Gastro-esophageal reflux disease without esophagitis: Secondary | ICD-10-CM | POA: Diagnosis not present

## 2020-07-11 DIAGNOSIS — M797 Fibromyalgia: Secondary | ICD-10-CM

## 2020-07-11 DIAGNOSIS — N950 Postmenopausal bleeding: Secondary | ICD-10-CM

## 2020-07-11 DIAGNOSIS — F3342 Major depressive disorder, recurrent, in full remission: Secondary | ICD-10-CM

## 2020-07-11 DIAGNOSIS — R3915 Urgency of urination: Secondary | ICD-10-CM

## 2020-07-11 DIAGNOSIS — F5101 Primary insomnia: Secondary | ICD-10-CM

## 2020-07-11 MED ORDER — PANTOPRAZOLE SODIUM 40 MG PO TBEC: 40.0000 mg | DELAYED_RELEASE_TABLET | Freq: Every day | ORAL | 1 refills | Status: DC

## 2020-07-11 MED ORDER — OMEPRAZOLE 40 MG PO CPDR: 40.0000 mg | DELAYED_RELEASE_CAPSULE | Freq: Every day | ORAL | 1 refills | Status: DC

## 2020-07-11 MED ORDER — GABAPENTIN 300 MG PO CAPS: 300.0000 mg | ORAL_CAPSULE | Freq: Three times a day (TID) | ORAL | 1 refills | Status: DC

## 2020-07-11 MED ORDER — TRAMADOL HCL 50 MG PO TABS: 50.0000 mg | ORAL_TABLET | Freq: Three times a day (TID) | ORAL | 0 refills | Status: DC | PRN

## 2020-07-11 MED ORDER — TOVIAZ 8 MG PO TB24
8.0000 mg | ORAL_TABLET | Freq: Every day | ORAL | 1 refills | Status: DC
Start: 2020-07-11 — End: 2021-01-07

## 2020-07-11 MED ORDER — AMLODIPINE BESYLATE 5 MG PO TABS: 5.0000 mg | ORAL_TABLET | Freq: Every day | ORAL | 1 refills | Status: DC

## 2020-07-11 MED ORDER — ATORVASTATIN CALCIUM 40 MG PO TABS: 40.0000 mg | ORAL_TABLET | Freq: Every day | ORAL | 1 refills | Status: DC

## 2020-07-11 MED ORDER — DULOXETINE HCL 60 MG PO CPEP: 60.0000 mg | ORAL_CAPSULE | Freq: Every day | ORAL | 1 refills | Status: DC

## 2020-07-11 MED ORDER — PRAMIPEXOLE DIHYDROCHLORIDE 1 MG PO TABS: 1.0000 mg | ORAL_TABLET | Freq: Two times a day (BID) | ORAL | 1 refills | Status: DC

## 2020-07-11 MED ORDER — ZOLPIDEM TARTRATE 10 MG PO TABS: 10.0000 mg | ORAL_TABLET | Freq: Every day | ORAL | 5 refills | Status: DC

## 2020-07-11 MED ORDER — LISINOPRIL-HYDROCHLOROTHIAZIDE 20-12.5 MG PO TABS
2.0000 | ORAL_TABLET | Freq: Every day | ORAL | 1 refills | Status: DC
Start: 2020-07-11 — End: 2021-07-03

## 2020-07-11 NOTE — Patient Instructions (Signed)
Muscle Pain, Adult Muscle pain, also called myalgia, is a condition in which a person has pain in one or more muscles in the body. Muscle pain may be mild, moderate, or severe. It may feel sharp, achy, or burning. In most cases, the pain lasts only a short time and goes away without treatment. Muscle pain can result from using muscles in a new or different way or after a period of inactivity. It is normal to feel some muscle pain after starting an exercise program. Muscles that have not been used often will be sore at first. What are the causes? This condition is caused by using muscles in a new or different way after a period of inactivity. Other causes may include:  Overuse or muscle strain, especially if you are not in shape. This is the most common cause of muscle pain.  Injury or bruising.  Infectious diseases, including diseases caused by viruses, such as the flu (influenza).  Fibromyalgia.This is a long-term, or chronic, condition that causes muscle tenderness, tiredness (fatigue), and headache.  Autoimmune or rheumatologic diseases. These are conditions, such as lupus, in which the body's defense system (immunesystem) attacks areas in the body.  Certain medicines, including ACE inhibitors and statins. What are the signs or symptoms? The main symptom of this condition is sore or painful muscles, including during activity and when stretching. You may also have slight swelling. How is this diagnosed? This condition is diagnosed with a physical exam. Your health care provider will ask questions about your pain and when it began. If you have not had muscle pain for very long, your health care provider may want to wait before doing much testing. If your muscle pain has lasted a long time, tests may be done right away. In some cases, this may include tests to rule out certain conditions or illnesses. How is this treated? Treatment for this condition depends on the cause. Home care is often  enough to relieve muscle pain. Your health care provider may also prescribe NSAIDs, such as ibuprofen. Follow these instructions at home: Medicines  Take over-the-counter and prescription medicines only as told by your health care provider.  Ask your health care provider if the medicine prescribed to you requires you to avoid driving or using machinery. Managing pain, swelling, and discomfort  If directed, put ice on the painful area. To do this: ? Put ice in a plastic bag. ? Place a towel between your skin and the bag. ? Leave the ice on for 20 minutes, 2-3 times a day.  For the first 2 days of muscle soreness, or if there is swelling: ? Do not soak in hot baths. ? Do not use a hot tub, steam room, sauna, heating pad, or other heat source.  After 48-72 hours, you may alternate between applying ice and applying heat as told by your health care provider. If directed, apply heat to the affected area as often as told by your health care provider. Use the heat source that your health care provider recommends, such as a moist heat pack or a heating pad. ? Place a towel between your skin and the heat source. ? Leave the heat on for 20-30 minutes. ? Remove the heat if your skin turns bright red. This is especially important if you are unable to feel pain, heat, or cold. You may have a greater risk of getting burned.  If you have an injury, raise (elevate) the injured area above the level of your heart while you   are sitting or lying down.      Activity  If overuse is causing your muscle pain: ? Slow down your activities until the pain goes away. ? Do regular, gentle exercises if you are not usually active. ? Warm up before exercising. Stretch before and after exercising. This can help lower the risk of muscle pain.  Do not continue working out if the pain is severe. Severe pain could mean that you have injured a muscle.  Do not lift anything that is heavier than 5-10 lb (2.3-4.5 kg), or  the limit that you are told, until your health care provider says that it is safe.  Return to your normal activities as told by your health care provider. Ask your health care provider what activities are safe for you.   General instructions  Do not use any products that contain nicotine or tobacco, such as cigarettes, e-cigarettes, and chewing tobacco. These can delay healing. If you need help quitting, ask your health care provider.  Keep all follow-up visits as told by your health care provider. This is important. Contact a health care provider if you have:  Muscle pain that gets worse and medicines do not help.  Muscle pain that lasts longer than 3 days.  A rash or fever along with muscle pain.  Muscle pain after a tick bite.  Muscle pain while working out, even though you are in good physical condition.  Redness, soreness, or swelling along with muscle pain.  Muscle pain after starting a new medicine or changing the dose of a medicine. Get help right away if you have:  Trouble breathing.  Trouble swallowing.  Muscle pain along with a stiff neck, fever, and vomiting.  Severe muscle weakness or you cannot move part of your body. These symptoms may represent a serious problem that is an emergency. Do not wait to see if the symptoms will go away. Get medical help right away. Call your local emergency services (911 in the [place] Do not drive yourself to the hospital. Summary  Muscle pain usually lasts only a short time and goes away without treatment.  This condition is caused by using muscles in a new or different way after a period of inactivity.  If your muscle pain lasts longer than 3 days, tell your health care provider. This information is not intended to replace advice given to you by your health care provider. Make sure you discuss any questions you have with your health care provider. Document Revised: 02/24/2019 Document Reviewed: 02/24/2019 Elsevier Patient  Education  2021 [employer]  

## 2020-07-11 NOTE — Progress Notes (Signed)
Subjective:    Patient ID: Marie Carter, female    DOB: May 06, 1961, 60 y.o.   MRN: [id number]   Chief Complaint: Medical Management of Chronic Issues    HPI:  1. Primary hypertension No c/o chest pain, or headache. Does not check blood pressure at home. BP Readings from Last 3 Encounters:  07/11/20 (!) 151/91  03/18/20 (!) 169/108  12/12/19 (!) 161/97     2. Mixed hyperlipidemia Doe snot watch diet and does no exercise. Lab Results  Component Value Date   CHOL 202 (H) 03/18/2020   HDL 57 03/18/2020   LDLCALC 132 (H) 03/18/2020   TRIG 74 03/18/2020   CHOLHDL 3.5 03/18/2020     3. Gastroesophageal reflux disease without esophagitis Is on protonix daily and is doing well.  4. RLS (restless legs syndrome) Is on mirapex nightly and is working well.  5. Recurrent major depressive disorder, in full remission ([place]) cymbalta helps with her depression. She is doing well she says. Depression screen [facility] 2/9 07/11/2020 03/18/2020 09/05/2019  Decreased Interest 0 0 0  Down, Depressed, Hopeless 0 0 0  PHQ - 2 Score 0 0 0  Altered sleeping 0 0 -  Tired, decreased energy 0 0 -  Change in appetite 0 0 -  Feeling bad or failure about yourself  0 0 -  Trouble concentrating 0 0 -  Moving slowly or fidgety/restless 0 0 -  Suicidal thoughts 0 0 -  PHQ-9 Score 0 0 -  Difficult doing work/chores Not difficult at all Not difficult at all -  Some recent data might be hidden     6. Fibromyalgia Pain assessment: Cause of pain- fibromyalgia Pain location- varies from day to day Pain on scale of 1-10- 7/10 currently Frequency- daily What increases pain-not sure anymore What makes pain Better-pain meds helps Effects on ADL - none Any change in general medical condition-none  Current opioids rx- ultram 57m TID # meds rx- 90 Effectiveness of current meds-helps but always has some pain Adverse reactions from pain meds-none Morphine equivalent- 15 MME  Pill count  performed-No Last drug screen - 03/07/19 ( high risk q327mmoderate risk q6m4mow risk yearly ) Urine drug screen today- No [place]  If yes were their any concerning findings? - no   Overdose risk: 1  Pain contract signed on: 03/25/20   7. Primary insomnia Is on ambien to sleep . She cannot sleep without it. Sleeps about 8 hours at night, getting up 1x to void.  8. Severe obesity (BMI >= 40) (HCC) No recent weight changes Wt Readings from Last 3 Encounters:  07/11/20 299 lb 6.4 oz (135.8 kg)  03/18/20 298 lb (135.2 kg)  12/12/19 294 lb (133.4 kg)   BMI Readings from Last 3 Encounters:  07/11/20 56.57 kg/m  03/18/20 56.31 kg/m  12/12/19 55.55 kg/m       Outpatient Encounter Medications as of 07/11/2020  Medication Sig  . amLODipine (NORVASC) 5 MG tablet TAKE 1 TABLET DAILY  . atorvastatin (LIPITOR) 40 MG tablet Take 1 tablet (40 mg total) by mouth daily.  . baclofen (LIORESAL) 10 MG tablet TAKE 1 TABLET THREE TIMES A DAY  . benzonatate (TESSALON) 100 MG capsule Take 1 capsule (100 mg total) by mouth 3 (three) times daily as needed for cough.  . DULoxetine (CYMBALTA) 60 MG capsule Take 1 capsule (60 mg total) by mouth daily.  . EMarland KitchenINEPHrine 0.3 mg/0.3 mL IJ SOAJ injection Inject 0.3 mLs (0.3 mg  total) into the muscle once.  . fesoterodine (TOVIAZ) 8 MG TB24 tablet Take 1 tablet (8 mg total) by mouth daily.  [name] gabapentin (NEURONTIN) 300 MG capsule Take 1 capsule (300 mg total) by mouth 3 (three) times daily.  [name] lisinopril-hydrochlorothiazide (ZESTORETIC) 20-12.5 MG tablet Take 2 tablets by mouth daily.  . medroxyPROGESTERone (PROVERA) 10 MG tablet TAKE 1 TABLET DAILY  . omeprazole (PRILOSEC) 40 MG capsule Take 1 capsule (40 mg total) by mouth daily.  . pantoprazole (PROTONIX) 40 MG tablet Take 1 tablet (40 mg total) by mouth daily.  . pramipexole (MIRAPEX) 1 MG tablet Take 1 tablet (1 mg total) by mouth 2 (two) times daily.  . predniSONE (DELTASONE) 20  MG tablet 2 po at sametime daily for 5 days  . PROAIR HFA 108 (90 Base) MCG/ACT inhaler USE 2 INHALATIONS EVERY 6 HOURS AS NEEDED FOR WHEEZING  . topiramate (TOPAMAX) 50 MG tablet Take 1 tablet (50 mg total) by mouth 3 (three) times daily.  . traMADol (ULTRAM) 50 MG tablet Take 1 tablet (50 mg total) by mouth every 8 (eight) hours as needed.  . traMADol (ULTRAM) 50 MG tablet Take 1 tablet (50 mg total) by mouth every 8 (eight) hours as needed.  . traMADol (ULTRAM) 50 MG tablet Take 1 tablet (50 mg total) by mouth every 8 (eight) hours as needed.  . zolpidem (AMBIEN) 10 MG tablet Take 1 tablet (10 mg total) by mouth at bedtime.   No facility-administered encounter medications on file as of 07/11/2020.    Past Surgical History:  Procedure Laterality Date  . BREAST SURGERY     biopsy times 2  . JOINT REPLACEMENT Right   . JOINT REPLACEMENT Left   . SPINE SURGERY     cervical spine    Family History  Problem Relation Age of Onset  . Diabetes Mother   . Heart attack Father 52  . Diabetes Father   . Stroke Maternal Grandmother   . Colon cancer Other        family history     New complaints: Patient has not had a period since she was 58years ole. Has had 2 months of periods.  Social history: Lives with her husband  Controlled substance contract: n/a    Review of Systems  Constitutional: Negative for diaphoresis.  Eyes: Negative for pain.  Respiratory: Negative for shortness of breath.   Cardiovascular: Negative for chest pain, palpitations and leg swelling.  Gastrointestinal: Negative for abdominal pain.  Endocrine: Negative for polydipsia.  Musculoskeletal: Positive for myalgias.  Skin: Negative for rash.  Neurological: Negative for dizziness, weakness and headaches.  Hematological: Does not bruise/bleed easily.  All other systems reviewed and are negative.      Objective:   Physical Exam Vitals and nursing note reviewed.  Constitutional:      General: She is not  in acute distress.    Appearance: Normal appearance. She is well-developed and well-nourished.  HENT:     Head: Normocephalic.     Nose: Nose normal.     Mouth/Throat:     Mouth: Oropharynx is clear and moist.  Eyes:     Extraocular Movements: EOM normal.     Pupils: Pupils are equal, round, and reactive to light.  Neck:     Vascular: No carotid bruit or JVD.  Cardiovascular:     Rate and Rhythm: Normal rate and regular rhythm.     Pulses: Intact distal pulses.     Heart sounds: Normal heart sounds.  Pulmonary:     Effort: Pulmonary effort is normal. No respiratory distress.     Breath sounds: Normal breath sounds. No wheezing or rales.  Chest:     Chest wall: No tenderness.  Abdominal:     General: Bowel sounds are normal. There is no distension or abdominal bruit. Aorta is normal.     Palpations: Abdomen is soft. There is no hepatomegaly, splenomegaly, mass or pulsatile mass.     Tenderness: There is no abdominal tenderness.  Musculoskeletal:        General: No edema. Normal range of motion.     Cervical back: Normal range of motion and neck supple.  Lymphadenopathy:     Cervical: No cervical adenopathy.  Skin:    General: Skin is warm and dry.  Neurological:     Mental Status: She is alert and oriented to person, place, and time.     Deep Tendon Reflexes: Reflexes are normal and symmetric.  Psychiatric:        Mood and Affect: Mood and affect normal.        Behavior: Behavior normal.        Thought Content: Thought content normal.        Judgment: Judgment normal.     BP (!) 151/91   Pulse 84   Temp 98.1 F (36.7 C) (Temporal)   Ht 5' 1"  (1.549 m)   Wt 299 lb 6.4 oz (135.8 kg)   LMP 09/13/2012   BMI 56.57 kg/m        Assessment & Plan:  Jorgia Manthei comes in today with chief complaint of Medical Management of Chronic Issues   Diagnosis and orders addressed:  1. Primary hypertension Low sodium diet - amLODipine (NORVASC) 5 MG tablet; Take 1 tablet (5  mg total) by mouth daily.  Dispense: 90 tablet; Refill: 1 - lisinopril-hydrochlorothiazide (ZESTORETIC) 20-12.5 MG tablet; Take 2 tablets by mouth daily.  Dispense: 180 tablet; Refill: 1  2. Mixed hyperlipidemia Low fat diet - atorvastatin (LIPITOR) 40 MG tablet; Take 1 tablet (40 mg total) by mouth daily.  Dispense: 90 tablet; Refill: 1  3. Gastroesophageal reflux disease without esophagitis Avoid spicy foods Do not eat 2 hours prior to bedtime - pantoprazole (PROTONIX) 40 MG tablet; Take 1 tablet (40 mg total) by mouth daily.  Dispense: 90 tablet; Refill: 1 - omeprazole (PRILOSEC) 40 MG capsule; Take 1 capsule (40 mg total) by mouth daily.  Dispense: 90 capsule; Refill: 1  4. RLS (restless legs syndrome) Keep legs warm at night - pramipexole (MIRAPEX) 1 MG tablet; Take 1 tablet (1 mg total) by mouth 2 (two) times daily.  Dispense: 180 tablet; Refill: 1  5. Recurrent major depressive disorder, in full remission [facility]) Stress management  6. Fibromyalgia Keep muscles warm - traMADol (ULTRAM) 50 MG tablet; Take 1 tablet (50 mg total) by mouth every 8 (eight) hours as needed.  Dispense: 90 tablet; Refill: 0 - traMADol (ULTRAM) 50 MG tablet; Take 1 tablet (50 mg total) by mouth every 8 (eight) hours as needed.  Dispense: 90 tablet; Refill: 0 - traMADol (ULTRAM) 50 MG tablet; Take 1 tablet (50 mg total) by mouth every 8 (eight) hours as needed.  Dispense: 90 tablet; Refill: 0 - DULoxetine (CYMBALTA) 60 MG capsule; Take 1 capsule (60 mg total) by mouth daily.  Dispense: 90 capsule; Refill: 1 - gabapentin (NEURONTIN) 300 MG capsule; Take 1 capsule (300 mg total) by mouth 3 (three) times daily.  Dispense: 540 capsule; Refill: 1  7.  Primary insomnia Bedtime routine - zolpidem (AMBIEN) 10 MG tablet; Take 1 tablet (10 mg total) by mouth at bedtime.  Dispense: 30 tablet; Refill: 5  8. Severe obesity (BMI >= 40) (HCC) Discussed diet and exercise for person with BMI >25 Will recheck weight in 3-6  months   9. Urinary urgency - fesoterodine (TOVIAZ) 8 MG TB24 tablet; Take 1 tablet (8 mg total) by mouth daily.  Dispense: 90 tablet; Refill: 1  10. Post-menopausal bleeding Patient told needs ot see her GYN- she will call and try to et an appointment  Orders Placed This Encounter  Procedures  . CBC with Differential/Platelet  . CMP14+EGFR  . Lipid panel  . ToxASSURE Select 13 (MW), Urine     Labs pending Health Maintenance reviewed Diet and exercise encouraged  Follow up plan: 3 months   Mary-Margaret Hassell Done, FNP

## 2020-07-12 LAB — CBC WITH DIFFERENTIAL/PLATELET
Basophils Absolute: 0 10*3/uL (ref 0.0–0.2)
Basos: 0 %
EOS (ABSOLUTE): 0.1 10*3/uL (ref 0.0–0.4)
Eos: 1 %
Hematocrit: 39.6 % (ref 34.0–46.6)
Hemoglobin: 13.3 g/dL (ref 11.1–15.9)
Immature Grans (Abs): 0 10*3/uL (ref 0.0–0.1)
Immature Granulocytes: 0 %
Lymphocytes Absolute: 2 10*3/uL (ref 0.7–3.1)
Lymphs: 28 %
MCH: 30.3 pg (ref 26.6–33.0)
MCHC: 33.6 g/dL (ref 31.5–35.7)
MCV: 90 fL (ref 79–97)
Monocytes Absolute: 0.6 10*3/uL (ref 0.1–0.9)
Monocytes: 8 %
Neutrophils Absolute: 4.5 10*3/uL (ref 1.4–7.0)
Neutrophils: 63 %
Platelets: 364 10*3/uL (ref 150–450)
RBC: 4.39 x10E6/uL (ref 3.77–5.28)
RDW: 12.3 % (ref 11.7–15.4)
WBC: 7.1 10*3/uL (ref 3.4–10.8)

## 2020-07-12 LAB — CMP14+EGFR
ALT: 11 IU/L (ref 0–32)
AST: 16 IU/L (ref 0–40)
Albumin/Globulin Ratio: 1.7 (ref 1.2–2.2)
Albumin: 4.5 g/dL (ref 3.8–4.8)
Alkaline Phosphatase: 100 IU/L (ref 44–121)
BUN/Creatinine Ratio: 11 — ABNORMAL LOW (ref 12–28)
BUN: 8 mg/dL (ref 8–27)
Bilirubin Total: 0.4 mg/dL (ref 0.0–1.2)
CO2: 21 mmol/L (ref 20–29)
Calcium: 9 mg/dL (ref 8.7–10.3)
Chloride: 104 mmol/L (ref 96–106)
Creatinine, Ser: 0.74 mg/dL (ref 0.57–1.00)
GFR calc Af Amer: 101 mL/min/{1.73_m2} (ref >59)
GFR calc non Af Amer: 88 mL/min/{1.73_m2} (ref >59)
Globulin, Total: 2.6 g/dL (ref 1.5–4.5)
Glucose: 88 mg/dL (ref 65–99)
Potassium: 4.2 mmol/L (ref 3.5–5.2)
Sodium: 138 mmol/L (ref 134–144)
Total Protein: 7.1 g/dL (ref 6.0–8.5)

## 2020-07-12 LAB — LIPID PANEL
Chol/HDL Ratio: 3 ratio (ref 0.0–4.4)
Cholesterol, Total: 110 mg/dL (ref 100–199)
HDL: 37 mg/dL — ABNORMAL LOW (ref >39)
LDL Chol Calc (NIH): 61 mg/dL (ref 0–99)
Triglycerides: 48 mg/dL (ref 0–149)
VLDL Cholesterol Cal: 12 mg/dL (ref 5–40)

## 2020-07-18 LAB — TOXASSURE SELECT 13 (MW), URINE

## 2020-10-08 ENCOUNTER — Encounter: Payer: Self-pay | Admitting: Nurse Practitioner

## 2020-10-08 ENCOUNTER — Ambulatory Visit (INDEPENDENT_AMBULATORY_CARE_PROVIDER_SITE_OTHER): Admitting: Nurse Practitioner

## 2020-10-08 ENCOUNTER — Other Ambulatory Visit: Payer: Self-pay

## 2020-10-08 VITALS — BP 158/94 | HR 92 | Temp 98.9°F | Resp 20 | Ht 61.0 in

## 2020-10-08 DIAGNOSIS — E782 Mixed hyperlipidemia: Secondary | ICD-10-CM

## 2020-10-08 DIAGNOSIS — G2581 Restless legs syndrome: Secondary | ICD-10-CM

## 2020-10-08 DIAGNOSIS — F3342 Major depressive disorder, recurrent, in full remission: Secondary | ICD-10-CM

## 2020-10-08 DIAGNOSIS — I10 Essential (primary) hypertension: Secondary | ICD-10-CM

## 2020-10-08 DIAGNOSIS — J4520 Mild intermittent asthma, uncomplicated: Secondary | ICD-10-CM

## 2020-10-08 DIAGNOSIS — G43009 Migraine without aura, not intractable, without status migrainosus: Secondary | ICD-10-CM

## 2020-10-08 DIAGNOSIS — K219 Gastro-esophageal reflux disease without esophagitis: Secondary | ICD-10-CM

## 2020-10-08 DIAGNOSIS — M797 Fibromyalgia: Secondary | ICD-10-CM

## 2020-10-08 DIAGNOSIS — F5101 Primary insomnia: Secondary | ICD-10-CM

## 2020-10-08 MED ORDER — TRAMADOL HCL 50 MG PO TABS: 50.0000 mg | ORAL_TABLET | Freq: Three times a day (TID) | ORAL | 0 refills | Status: DC | PRN

## 2020-10-08 NOTE — Progress Notes (Signed)
Subjective:    Patient ID: Marie Carter, female    DOB: 1961/03/13, 61 y.o.   MRN: [id number]   Chief Complaint: Medical Management of Chronic Issues    HPI:  1. Primary hypertension No c/o chest pain, sob or headache. Does not check blood pressure at home. BP Readings from Last 3 Encounters:  10/08/20 (!) 158/94  07/11/20 (!) 151/91  03/18/20 (!) 169/108     2. Mixed hyperlipidemia Does try to watch diet but does very little to no exercise. Lab Results  Component Value Date   CHOL 110 07/11/2020   HDL 37 (L) 07/11/2020   LDLCALC 61 07/11/2020   TRIG 48 07/11/2020   CHOLHDL 3.0 07/11/2020     3. Migraine without aura and without status migrainosus, not intractable Off and on. Are tolerable.  4. Mild intermittent chronic asthma without complication Does well if Marie Carter avoids going out in pollen.  5. Gastroesophageal reflux disease without esophagitis Is on prilosec and works well to keep symptoms under control  6. Recurrent major depressive disorder, in full remission (HCC) Is currently on cymbalta which helps. Marie Carter mainly takes it for Marie Carter fibromyalgia. Depression screen [facility] 2/9 10/08/2020 10/08/2020 07/11/2020  Decreased Interest 0 0 0  Down, Depressed, Hopeless 0 0 0  PHQ - 2 Score 0 0 0  Altered sleeping - - 0  Tired, decreased energy - - 0  Change in appetite - - 0  Feeling bad or failure about yourself  - - 0  Trouble concentrating - - 0  Moving slowly or fidgety/restless - - 0  Suicidal thoughts - - 0  PHQ-9 Score - - 0  Difficult doing work/chores - - Not difficult at all  Some recent data might be hidden     7. Fibromyalgia Does very little exercise Pain assessment: Cause of pain- fibrmyalgia Pain location- varies from day to day Pain on scale of 1-10- 5/10 currently Frequency- daily What increases pain-to much activity What makes pain Better-rest Effects on ADL - none Any change in general medical condition-none  Current opioids rx- ultram  50mg  TID # meds rx- 90 Effectiveness of current meds-helps Adverse reactions from pain meds-none Morphine equivalent- 15 MME  Pill count performed-No Last drug screen - 07/11/20 ( high risk q4m, moderate risk q35m, low risk yearly ) Urine drug screen today- No [place]- yes  If yes were their any concerning findings? - none   Overdose risk: 1  Pain contract signed on: 03/25/20   8. Primary insomnia Is on ambien and sleeps 6-7 hours anoght  9. RLS (restless legs syndrome) Is on mirapex which helsp Marie Carter legs be still at noght  10. Severe obesity (BMI >= 40) (HCC) No recent weight changes Wt Readings from Last 3 Encounters:  07/11/20 299 lb 6.4 oz (135.8 kg)  03/18/20 298 lb (135.2 kg)  12/12/19 294 lb (133.4 kg)   BMI Readings from Last 3 Encounters:  10/08/20 56.57 kg/m  07/11/20 56.57 kg/m  03/18/20 56.31 kg/m       Outpatient Encounter Medications as of 10/08/2020  Medication Sig  . amLODipine (NORVASC) 5 MG tablet Take 1 tablet (5 mg total) by mouth daily.  12/08/2020 atorvastatin (LIPITOR) 40 MG tablet Take 1 tablet (40 mg total) by mouth daily.  . baclofen (LIORESAL) 10 MG tablet TAKE 1 TABLET THREE TIMES A DAY  . DULoxetine (CYMBALTA) 60 MG capsule Take 1 capsule (60 mg total) by mouth daily.  [name] EPINEPHrine 0.3 mg/0.3 mL IJ  SOAJ injection Inject 0.3 mLs (0.3 mg total) into the muscle once.  . fesoterodine (TOVIAZ) 8 MG TB24 tablet Take 1 tablet (8 mg total) by mouth daily.  [name] gabapentin (NEURONTIN) 300 MG capsule Take 1 capsule (300 mg total) by mouth 3 (three) times daily.  [name] lisinopril-hydrochlorothiazide (ZESTORETIC) 20-12.5 MG tablet Take 2 tablets by mouth daily.  . medroxyPROGESTERone (PROVERA) 10 MG tablet TAKE 1 TABLET DAILY  . omeprazole (PRILOSEC) 40 MG capsule Take 1 capsule (40 mg total) by mouth daily.  . pantoprazole (PROTONIX) 40 MG tablet Take 1 tablet (40 mg total) by mouth daily.  . pramipexole (MIRAPEX) 1 MG tablet Take 1 tablet (1 mg  total) by mouth 2 (two) times daily.  [name] PROAIR HFA 108 (90 Base) MCG/ACT inhaler USE 2 INHALATIONS EVERY 6 HOURS AS NEEDED FOR WHEEZING  . topiramate (TOPAMAX) 50 MG tablet Take 1 tablet (50 mg total) by mouth 3 (three) times daily.  . traMADol (ULTRAM) 50 MG tablet Take 1 tablet (50 mg total) by mouth every 8 (eight) hours as needed.  . zolpidem (AMBIEN) 10 MG tablet Take 1 tablet (10 mg total) by mouth at bedtime.  . [DISCONTINUED] benzonatate (TESSALON) 100 MG capsule Take 1 capsule (100 mg total) by mouth 3 (three) times daily as needed for cough.  . traMADol (ULTRAM) 50 MG tablet Take 1 tablet (50 mg total) by mouth every 8 (eight) hours as needed.  . traMADol (ULTRAM) 50 MG tablet Take 1 tablet (50 mg total) by mouth every 8 (eight) hours as needed.   No facility-administered encounter medications on file as of 10/08/2020.    Past Surgical History:  Procedure Laterality Date  . BREAST SURGERY     biopsy times 2  . JOINT REPLACEMENT Right   . JOINT REPLACEMENT Left   . SPINE SURGERY     cervical spine    Family History  Problem Relation Age of Onset  . Diabetes Mother   . Heart attack Father 42  . Diabetes Father   . Stroke Maternal Grandmother   . Colon cancer Other        family history     New complaints: None today  Social history: Lives with Marie Carter husband     Review of Systems  Constitutional: Negative for diaphoresis.  Eyes: Negative for pain.  Respiratory: Negative for shortness of breath.   Cardiovascular: Negative for chest pain, palpitations and leg swelling.  Gastrointestinal: Negative for abdominal pain.  Endocrine: Negative for polydipsia.  Skin: Negative for rash.  Neurological: Negative for dizziness, weakness and headaches.  Hematological: Does not bruise/bleed easily.  All other systems reviewed and are negative.      Objective:   Physical Exam Vitals and nursing note reviewed.  Constitutional:      General: Marie Carter is not in acute distress.     Appearance: Normal appearance. Marie Carter is well-developed.  HENT:     Head: Normocephalic.     Nose: Nose normal.  Eyes:     Pupils: Pupils are equal, round, and reactive to light.  Neck:     Vascular: No carotid bruit or JVD.  Cardiovascular:     Rate and Rhythm: Normal rate and regular rhythm.     Heart sounds: Normal heart sounds.  Pulmonary:     Effort: Pulmonary effort is normal. No respiratory distress.     Breath sounds: Normal breath sounds. No wheezing or rales.  Chest:     Chest wall: No tenderness.  Abdominal:  General: Bowel sounds are normal. There is no distension or abdominal bruit.     Palpations: Abdomen is soft. There is no hepatomegaly, splenomegaly, mass or pulsatile mass.     Tenderness: There is no abdominal tenderness.  Musculoskeletal:        General: Normal range of motion.     Cervical back: Normal range of motion and neck supple.     Right lower leg: Edema (2+) present.     Left lower leg: Edema (2+) present.     Comments: Walking with walker- gait slow and steady  Lymphadenopathy:     Cervical: No cervical adenopathy.  Skin:    General: Skin is warm and dry.  Neurological:     Mental Status: Marie Carter is alert and oriented to person, place, and time.     Deep Tendon Reflexes: Reflexes are normal and symmetric.  Psychiatric:        Behavior: Behavior normal.        Thought Content: Thought content normal.        Judgment: Judgment normal.     BP (!) 158/94   Pulse 92   Temp 98.9 F (37.2 C) (Temporal)   Resp 20   Ht 5\' 1"  (1.549 m)   LMP 09/13/2012   SpO2 91%   BMI 56.57 kg/m        Assessment & Plan:  Marie Carter comes in today with chief complaint of Medical Management of Chronic Issues   Diagnosis and orders addressed:  1. Primary hypertension Low sodium diet  2. Mixed hyperlipidemia Low fat diet  3. Migraine without aura and without status migrainosus, not intractable Avoid caffeine  4. Mild intermittent chronic asthma  without complication Avoid being out in pollen  5. Gastroesophageal reflux disease without esophagitis Avoid spicy foods Do not eat 2 hours prior to bedtime  6. Recurrent major depressive disorder, in full remission [facility]) Stress management  7. Fibromyalgia  - traMADol (ULTRAM) 50 MG tablet; Take 1 tablet (50 mg total) by mouth every 8 (eight) hours as needed.  Dispense: 90 tablet; Refill: 0 - traMADol (ULTRAM) 50 MG tablet; Take 1 tablet (50 mg total) by mouth every 8 (eight) hours as needed.  Dispense: 90 tablet; Refill: 0 - traMADol (ULTRAM) 50 MG tablet; Take 1 tablet (50 mg total) by mouth every 8 (eight) hours as needed.  Dispense: 90 tablet; Refill: 0  8. Primary insomnia Bedtime routine  9. RLS (restless legs syndrome) Keep legs warm at night  10. Severe obesity (BMI >= 40) (HCC) Discussed diet and exercise for person with BMI >25 Will recheck weight in 3-6 months   Labs pending Health Maintenance reviewed Diet and exercise encouraged  Follow up plan: 3 months   Mary-Margaret [facility], FNP

## 2020-10-22 ENCOUNTER — Other Ambulatory Visit: Payer: Self-pay | Admitting: Nurse Practitioner

## 2020-11-27 ENCOUNTER — Ambulatory Visit
Admission: RE | Admit: 2020-11-27 | Discharge: 2020-11-27 | Disposition: A | Discharge status: Home / Self Care | Source: Ambulatory Visit | Attending: Nurse Practitioner | Admitting: Nurse Practitioner

## 2020-11-27 DIAGNOSIS — Z1231 Encounter for screening mammogram for malignant neoplasm of breast: Secondary | ICD-10-CM

## 2020-12-03 ENCOUNTER — Other Ambulatory Visit: Payer: Self-pay | Admitting: Nurse Practitioner

## 2020-12-03 DIAGNOSIS — R928 Other abnormal and inconclusive findings on diagnostic imaging of breast: Secondary | ICD-10-CM

## 2020-12-05 ENCOUNTER — Other Ambulatory Visit: Payer: Self-pay | Admitting: Nurse Practitioner

## 2020-12-05 DIAGNOSIS — M797 Fibromyalgia: Secondary | ICD-10-CM

## 2021-01-07 ENCOUNTER — Encounter: Payer: Self-pay | Admitting: Nurse Practitioner

## 2021-01-07 ENCOUNTER — Ambulatory Visit: Admitting: Nurse Practitioner

## 2021-01-07 ENCOUNTER — Other Ambulatory Visit: Payer: Self-pay

## 2021-01-07 VITALS — BP 138/101 | HR 94 | Temp 98.0°F | Resp 20 | Ht 61.0 in | Wt 299.0 lb

## 2021-01-07 DIAGNOSIS — F3342 Major depressive disorder, recurrent, in full remission: Secondary | ICD-10-CM

## 2021-01-07 DIAGNOSIS — G2581 Restless legs syndrome: Secondary | ICD-10-CM

## 2021-01-07 DIAGNOSIS — I10 Essential (primary) hypertension: Secondary | ICD-10-CM

## 2021-01-07 DIAGNOSIS — E782 Mixed hyperlipidemia: Secondary | ICD-10-CM | POA: Diagnosis not present

## 2021-01-07 DIAGNOSIS — F5101 Primary insomnia: Secondary | ICD-10-CM

## 2021-01-07 DIAGNOSIS — G43009 Migraine without aura, not intractable, without status migrainosus: Secondary | ICD-10-CM

## 2021-01-07 DIAGNOSIS — M797 Fibromyalgia: Secondary | ICD-10-CM

## 2021-01-07 DIAGNOSIS — K219 Gastro-esophageal reflux disease without esophagitis: Secondary | ICD-10-CM

## 2021-01-07 DIAGNOSIS — R3915 Urgency of urination: Secondary | ICD-10-CM

## 2021-01-07 MED ORDER — PRAMIPEXOLE DIHYDROCHLORIDE 1 MG PO TABS: 1.0000 mg | ORAL_TABLET | Freq: Two times a day (BID) | ORAL | 1 refills | Status: DC

## 2021-01-07 MED ORDER — AMLODIPINE BESYLATE 5 MG PO TABS: 5.0000 mg | ORAL_TABLET | Freq: Every day | ORAL | 1 refills | Status: DC

## 2021-01-07 MED ORDER — TOPIRAMATE 50 MG PO TABS: 50.0000 mg | ORAL_TABLET | Freq: Three times a day (TID) | ORAL | 1 refills | Status: DC

## 2021-01-07 MED ORDER — DULOXETINE HCL 60 MG PO CPEP: 60.0000 mg | ORAL_CAPSULE | Freq: Every day | ORAL | 1 refills | Status: DC

## 2021-01-07 MED ORDER — TRAMADOL HCL 50 MG PO TABS: 50.0000 mg | ORAL_TABLET | Freq: Three times a day (TID) | ORAL | 0 refills | Status: DC | PRN

## 2021-01-07 MED ORDER — OMEPRAZOLE 40 MG PO CPDR: 40.0000 mg | DELAYED_RELEASE_CAPSULE | Freq: Every day | ORAL | 1 refills | Status: DC

## 2021-01-07 MED ORDER — GABAPENTIN 300 MG PO CAPS: 300.0000 mg | ORAL_CAPSULE | Freq: Three times a day (TID) | ORAL | 1 refills | Status: DC

## 2021-01-07 MED ORDER — BACLOFEN 10 MG PO TABS: 10.0000 mg | ORAL_TABLET | Freq: Three times a day (TID) | ORAL | 11 refills | Status: DC

## 2021-01-07 MED ORDER — ZOLPIDEM TARTRATE 10 MG PO TABS: 10.0000 mg | ORAL_TABLET | Freq: Every day | ORAL | 5 refills | Status: DC

## 2021-01-07 MED ORDER — PANTOPRAZOLE SODIUM 40 MG PO TBEC: 40.0000 mg | DELAYED_RELEASE_TABLET | Freq: Every day | ORAL | 1 refills | Status: DC

## 2021-01-07 MED ORDER — FESOTERODINE FUMARATE ER 8 MG PO TB24: 8.0000 mg | ORAL_TABLET | Freq: Every day | ORAL | 1 refills | Status: DC

## 2021-01-07 MED ORDER — ATORVASTATIN CALCIUM 40 MG PO TABS: 40.0000 mg | ORAL_TABLET | Freq: Every day | ORAL | 1 refills | Status: DC

## 2021-01-07 NOTE — Progress Notes (Signed)
Subjective:    Patient ID: Marie Carter, female    DOB: [birth year], 60 y.o.   MRN: [id number]  Chief Complaint: Medical Management of Chronic Issues    HPI:  1. Primary hypertension No c/o chest pain, sob or headches. Does not check blood pressure at home. BP Readings from Last 3 Encounters:  10/08/20 (!) 158/94  07/11/20 (!) 151/91  03/18/20 (!) 169/108     2. Mixed hyperlipidemia Does not watch diet and does no exercise. Lab Results  Component Value Date   CHOL 110 07/11/2020   HDL 37 (L) 07/11/2020   LDLCALC 61 07/11/2020   TRIG 48 07/11/2020   CHOLHDL 3.0 07/11/2020     3. Migraine without aura and without status migrainosus, not intractable Is on topamax for prevention. Still has occasioanal headaches.  4. Gastroesophageal reflux disease without esophagitis Is on protonix daily and is doing well.  5. Recurrent major depressive disorder, in full remission ([facility]) Is on cymbalta and is doing well.  6. Fibromyalgia Pain assessment: Cause of pain- fibromyalgia Pain location- varies from day to day Pain on scale of 1-10- 5/10 currently Frequency- daily What increases pain-nothing really What makes pain Better-nothing but pain meds hlep Effects on ADL - none Any change in general medical condition-none  Current opioids rx- ultram tid # meds rx- 90 Effectiveness of current meds-helps Adverse reactions from pain meds-none Morphine equivalent- 15MME  Pill count performed-No Last drug screen - 07/11/20 ( high risk q44m moderate risk q657mlow risk yearly ) Urine drug screen today- No Was the NCEtheleviewed- yes  If yes were their any concerning findings? - no   Overdose risk: 1  Pain contract signed on:03/25/20   7. Primary insomnia Is on ambien nightly to sleep. Sleeps about 7 hours a night  8. RLS (restless legs syndrome) Is on mirapex and works well to keep legs at rest  9. Severe obesity (BMI >= 40) (HCC) No recent weight changes Wt Readings  from Last 3 Encounters:  01/07/21 299 lb (135.6 kg)  07/11/20 299 lb 6.4 oz (135.8 kg)  03/18/20 298 lb (135.2 kg)   BMI Readings from Last 3 Encounters:  01/07/21 56.50 kg/m  10/08/20 56.57 kg/m  07/11/20 56.57 kg/m       Outpatient Encounter Medications as of 01/07/2021  Medication Sig   amLODipine (NORVASC) 5 MG tablet Take 1 tablet (5 mg total) by mouth daily.   atorvastatin (LIPITOR) 40 MG tablet Take 1 tablet (40 mg total) by mouth daily.   baclofen (LIORESAL) 10 MG tablet TAKE 1 TABLET THREE TIMES A DAY   DULoxetine (CYMBALTA) 60 MG capsule Take 1 capsule (60 mg total) by mouth daily.   EPINEPHrine 0.3 mg/0.3 mL IJ SOAJ injection Inject 0.3 mLs (0.3 mg total) into the muscle once.   fesoterodine (TOVIAZ) 8 MG TB24 tablet Take 1 tablet (8 mg total) by mouth daily.   gabapentin (NEURONTIN) 300 MG capsule Take 1 capsule (300 mg total) by mouth 3 (three) times daily.   lisinopril-hydrochlorothiazide (ZESTORETIC) 20-12.5 MG tablet Take 2 tablets by mouth daily.   medroxyPROGESTERone (PROVERA) 10 MG tablet TAKE 1 TABLET DAILY   omeprazole (PRILOSEC) 40 MG capsule Take 1 capsule (40 mg total) by mouth daily.   pantoprazole (PROTONIX) 40 MG tablet Take 1 tablet (40 mg total) by mouth daily.   pramipexole (MIRAPEX) 1 MG tablet Take 1 tablet (1 mg total) by mouth 2 (two) times daily.   PROAIR HFA 108 (90 Base) MCG/ACT inhaler  USE 2 INHALATIONS EVERY 6 HOURS AS NEEDED FOR WHEEZING   topiramate (TOPAMAX) 50 MG tablet Take 1 tablet (50 mg total) by mouth 3 (three) times daily.   traMADol (ULTRAM) 50 MG tablet Take 1 tablet (50 mg total) by mouth every 8 (eight) hours as needed.   traMADol (ULTRAM) 50 MG tablet Take 1 tablet (50 mg total) by mouth every 8 (eight) hours as needed.   traMADol (ULTRAM) 50 MG tablet Take 1 tablet (50 mg total) by mouth every 8 (eight) hours as needed.   zolpidem (AMBIEN) 10 MG tablet Take 1 tablet (10 mg total) by mouth at bedtime.   No facility-administered  encounter medications on file as of 01/07/2021.    Past Surgical History:  Procedure Laterality Date   BREAST BIOPSY Left    x 2 / 10-12 years ago / benign   BREAST SURGERY     biopsy times 2   JOINT REPLACEMENT Right    JOINT REPLACEMENT Left    SPINE SURGERY     cervical spine    Family History  Problem Relation Age of Onset   Diabetes Mother    Heart attack Father 93   Diabetes Father    Breast cancer Maternal Grandmother    Stroke Maternal Grandmother    Colon cancer Other        family history     New complaints: None today  Social history: Lives with her husband  Controlled substance contract: n/a     Review of Systems  Constitutional:  Negative for diaphoresis.  Eyes:  Negative for pain.  Respiratory:  Negative for shortness of breath.   Cardiovascular:  Negative for chest pain, palpitations and leg swelling.  Gastrointestinal:  Negative for abdominal pain.  Endocrine: Negative for polydipsia.  Skin:  Negative for rash.  Neurological:  Negative for dizziness, weakness and headaches.  Hematological:  Does not bruise/bleed easily.  All other systems reviewed and are negative.     Objective:   Physical Exam Vitals and nursing note reviewed.  Constitutional:      General: She is not in acute distress.    Appearance: Normal appearance. She is well-developed.  HENT:     Head: Normocephalic.     Right Ear: Tympanic membrane normal.     Left Ear: Tympanic membrane normal.     Nose: Nose normal.     Mouth/Throat:     Mouth: Mucous membranes are moist.  Eyes:     Pupils: Pupils are equal, round, and reactive to light.  Neck:     Vascular: No carotid bruit or JVD.  Cardiovascular:     Rate and Rhythm: Normal rate and regular rhythm.     Heart sounds: Normal heart sounds.  Pulmonary:     Effort: Pulmonary effort is normal. No respiratory distress.     Breath sounds: Normal breath sounds. No wheezing or rales.  Chest:     Chest wall: No tenderness.   Abdominal:     General: Bowel sounds are normal. There is no distension or abdominal bruit.     Palpations: Abdomen is soft. There is no hepatomegaly, splenomegaly, mass or pulsatile mass.     Tenderness: There is no abdominal tenderness.  Musculoskeletal:        General: Normal range of motion.     Cervical back: Normal range of motion and neck supple.     Right lower leg: Edema (1+) present.     Left lower leg: Edema (1+) present.  Comments: walking with walker- gait slow and steady  Lymphadenopathy:     Cervical: No cervical adenopathy.  Skin:    General: Skin is warm and dry.  Neurological:     Mental Status: She is alert and oriented to person, place, and time.     Deep Tendon Reflexes: Reflexes are normal and symmetric.  Psychiatric:        Behavior: Behavior normal.        Thought Content: Thought content normal.        Judgment: Judgment normal.    BP (!) 138/101   Pulse 94   Temp 98 F (36.7 C) (Temporal)   Resp 20   Ht 5' 1"  (1.549 m)   Wt 299 lb (135.6 kg)   LMP 09/13/2012   SpO2 93%   BMI 56.50 kg/m        Assessment & Plan:  Toriann Spadoni comes in today with chief complaint of Medical Management of Chronic Issues   Diagnosis and orders addressed:  1. Primary hypertension Low sodium diet - amLODipine (NORVASC) 5 MG tablet; Take 1 tablet (5 mg total) by mouth daily.  Dispense: 90 tablet; Refill: 1 - CBC with Differential/Platelet - CMP14+EGFR  2. Mixed hyperlipidemia Low fat diet - atorvastatin (LIPITOR) 40 MG tablet; Take 1 tablet (40 mg total) by mouth daily.  Dispense: 90 tablet; Refill: 1 - Lipid panel  3. Migraine without aura and without status migrainosus, not intractable Avoid caffeine - topiramate (TOPAMAX) 50 MG tablet; Take 1 tablet (50 mg total) by mouth 3 (three) times daily.  Dispense: 270 tablet; Refill: 1  4. Gastroesophageal reflux disease without esophagitis Avoid spicy foods Do not eat 2 hours prior to bedtime -  pantoprazole (PROTONIX) 40 MG tablet; Take 1 tablet (40 mg total) by mouth daily.  Dispense: 90 tablet; Refill: 1 - omeprazole (PRILOSEC) 40 MG capsule; Take 1 capsule (40 mg total) by mouth daily.  Dispense: 90 capsule; Refill: 1  5. Recurrent major depressive disorder, in full remission [facility]) Stress management  6. Fibromyalgia Exercise to keep muscles warm - traMADol (ULTRAM) 50 MG tablet; Take 1 tablet (50 mg total) by mouth every 8 (eight) hours as needed.  Dispense: 90 tablet; Refill: 0 - traMADol (ULTRAM) 50 MG tablet; Take 1 tablet (50 mg total) by mouth every 8 (eight) hours as needed.  Dispense: 90 tablet; Refill: 0 - traMADol (ULTRAM) 50 MG tablet; Take 1 tablet (50 mg total) by mouth every 8 (eight) hours as needed.  Dispense: 90 tablet; Refill: 0 - DULoxetine (CYMBALTA) 60 MG capsule; Take 1 capsule (60 mg total) by mouth daily.  Dispense: 90 capsule; Refill: 1 - gabapentin (NEURONTIN) 300 MG capsule; Take 1 capsule (300 mg total) by mouth 3 (three) times daily.  Dispense: 540 capsule; Refill: 1 - baclofen (LIORESAL) 10 MG tablet; Take 1 tablet (10 mg total) by mouth 3 (three) times daily.  Dispense: 90 tablet; Refill: 11  7. Primary insomnia Bedtime routine - zolpidem (AMBIEN) 10 MG tablet; Take 1 tablet (10 mg total) by mouth at bedtime.  Dispense: 30 tablet; Refill: 5  8. RLS (restless legs syndrome) Keep legs warm at night - pramipexole (MIRAPEX) 1 MG tablet; Take 1 tablet (1 mg total) by mouth 2 (two) times daily.  Dispense: 180 tablet; Refill: 1  9. Severe obesity (BMI >= 40) (HCC) Discussed diet and exercise for person with BMI >25 Will recheck weight in 3-6 months   10. Urinary urgency - fesoterodine (TOVIAZ) 8 MG TB24  tablet; Take 1 tablet (8 mg total) by mouth daily.  Dispense: 90 tablet; Refill: 1   Labs pending Health Maintenance reviewed Diet and exercise encouraged  Follow up plan: 3 months   Mary-Margaret Hassell Done, FNP

## 2021-01-08 ENCOUNTER — Ambulatory Visit: Payer: Self-pay | Admitting: Nurse Practitioner

## 2021-01-08 LAB — CBC WITH DIFFERENTIAL/PLATELET
Basophils Absolute: 0.1 10*3/uL (ref 0.0–0.2)
Basos: 1 %
EOS (ABSOLUTE): 0.3 10*3/uL (ref 0.0–0.4)
Eos: 4 %
Hematocrit: 47.7 % — ABNORMAL HIGH (ref 34.0–46.6)
Hemoglobin: 15.6 g/dL (ref 11.1–15.9)
Immature Grans (Abs): 0 10*3/uL (ref 0.0–0.1)
Immature Granulocytes: 0 %
Lymphocytes Absolute: 2.6 10*3/uL (ref 0.7–3.1)
Lymphs: 35 %
MCH: 29.7 pg (ref 26.6–33.0)
MCHC: 32.7 g/dL (ref 31.5–35.7)
MCV: 91 fL (ref 79–97)
Monocytes Absolute: 0.4 10*3/uL (ref 0.1–0.9)
Monocytes: 6 %
Neutrophils Absolute: 4 10*3/uL (ref 1.4–7.0)
Neutrophils: 54 %
Platelets: 389 10*3/uL (ref 150–450)
RBC: 5.25 x10E6/uL (ref 3.77–5.28)
RDW: 13.1 % (ref 11.7–15.4)
WBC: 7.3 10*3/uL (ref 3.4–10.8)

## 2021-01-08 LAB — CMP14+EGFR
ALT: 57 IU/L — ABNORMAL HIGH (ref 0–32)
AST: 27 IU/L (ref 0–40)
Albumin/Globulin Ratio: 1.4 (ref 1.2–2.2)
Albumin: 4.4 g/dL (ref 3.8–4.8)
Alkaline Phosphatase: 142 IU/L — ABNORMAL HIGH (ref 44–121)
BUN/Creatinine Ratio: 20 (ref 12–28)
BUN: 20 mg/dL (ref 8–27)
Bilirubin Total: 0.3 mg/dL (ref 0.0–1.2)
CO2: 24 mmol/L (ref 20–29)
Calcium: 9.2 mg/dL (ref 8.7–10.3)
Chloride: 101 mmol/L (ref 96–106)
Creatinine, Ser: 1.01 mg/dL — ABNORMAL HIGH (ref 0.57–1.00)
Globulin, Total: 3.1 g/dL (ref 1.5–4.5)
Glucose: 109 mg/dL — ABNORMAL HIGH (ref 65–99)
Potassium: 4.5 mmol/L (ref 3.5–5.2)
Sodium: 142 mmol/L (ref 134–144)
Total Protein: 7.5 g/dL (ref 6.0–8.5)
eGFR: 63 mL/min/{1.73_m2} (ref >59)

## 2021-01-08 LAB — LIPID PANEL
Chol/HDL Ratio: 4.5 ratio — ABNORMAL HIGH (ref 0.0–4.4)
Cholesterol, Total: 248 mg/dL — ABNORMAL HIGH (ref 100–199)
HDL: 55 mg/dL (ref >39)
LDL Chol Calc (NIH): 171 mg/dL — ABNORMAL HIGH (ref 0–99)
Triglycerides: 125 mg/dL (ref 0–149)
VLDL Cholesterol Cal: 22 mg/dL (ref 5–40)

## 2021-01-08 MED ORDER — ROSUVASTATIN CALCIUM 20 MG PO TABS: 20.0000 mg | ORAL_TABLET | Freq: Every day | ORAL | 1 refills | Status: DC

## 2021-01-08 NOTE — Addendum Note (Signed)
Addended by: Bennie Pierini on: 01/08/2021 01:22 PM   Modules accepted: Orders

## 2021-02-05 ENCOUNTER — Other Ambulatory Visit: Payer: Self-pay

## 2021-02-05 ENCOUNTER — Ambulatory Visit
Admission: RE | Admit: 2021-02-05 | Discharge: 2021-02-05 | Disposition: A | Discharge status: Home / Self Care | Source: Ambulatory Visit | Attending: Nurse Practitioner | Admitting: Nurse Practitioner

## 2021-02-05 ENCOUNTER — Other Ambulatory Visit

## 2021-02-05 DIAGNOSIS — R928 Other abnormal and inconclusive findings on diagnostic imaging of breast: Secondary | ICD-10-CM

## 2021-02-17 ENCOUNTER — Other Ambulatory Visit: Payer: Self-pay | Admitting: Nurse Practitioner

## 2021-02-17 DIAGNOSIS — M797 Fibromyalgia: Secondary | ICD-10-CM

## 2021-04-10 ENCOUNTER — Ambulatory Visit: Payer: Self-pay | Admitting: Nurse Practitioner

## 2021-04-15 ENCOUNTER — Ambulatory Visit: Admitting: Nurse Practitioner

## 2021-04-15 ENCOUNTER — Other Ambulatory Visit: Payer: Self-pay

## 2021-04-15 ENCOUNTER — Encounter: Payer: Self-pay | Admitting: Nurse Practitioner

## 2021-04-15 VITALS — BP 136/92 | HR 81 | Temp 97.6°F | Resp 20 | Ht 61.0 in | Wt 300.0 lb

## 2021-04-15 DIAGNOSIS — M797 Fibromyalgia: Secondary | ICD-10-CM

## 2021-04-15 MED ORDER — TRAMADOL HCL 50 MG PO TABS: 50.0000 mg | ORAL_TABLET | Freq: Three times a day (TID) | ORAL | 0 refills | Status: DC | PRN

## 2021-04-15 NOTE — Progress Notes (Signed)
Subjective:    Patient ID: Marie Carter, female    DOB: 1960/12/09, 60 y.o.   MRN: [id number]  Chief Complaint: Pain Management   HPI Pain assessment: Cause of pain- fibromyalgia Pain location- varies form day to day Pain on scale of 1-10- 5/10 Frequency- daily What increases pain-to much standing or walking What makes pain Better-pain meds help and rest helps Effects on ADL - none Any change in general medical condition-none  Current opioids rx- ultram TID # meds rx- 90 Effectiveness of current meds-none Adverse reactions from pain meds-none Morphine equivalent-  Pill count performed-No Last drug screen - 07/11/20 ( high risk q66m, moderate risk q8m, low risk yearly ) Urine drug screen today- Yes Was the NCCSR reviewed- yes  If yes were their any concerning findings? - no   Overdose risk: 1   Pain contract signed on: 04/15/21     Review of Systems  Constitutional:  Negative for diaphoresis.  Eyes:  Negative for pain.  Respiratory:  Negative for shortness of breath.   Cardiovascular:  Negative for chest pain, palpitations and leg swelling.  Gastrointestinal:  Negative for abdominal pain.  Endocrine: Negative for polydipsia.  Skin:  Negative for rash.  Neurological:  Negative for dizziness, weakness and headaches.  Hematological:  Does not bruise/bleed easily.  All other systems reviewed and are negative.     Objective:   Physical Exam Vitals and nursing note reviewed.  Constitutional:      General: She is not in acute distress.    Appearance: Normal appearance. She is well-developed.  Neck:     Vascular: No carotid bruit or JVD.  Cardiovascular:     Rate and Rhythm: Normal rate and regular rhythm.     Heart sounds: Normal heart sounds.  Pulmonary:     Effort: Pulmonary effort is normal. No respiratory distress.     Breath sounds: Normal breath sounds. No wheezing or rales.  Chest:     Chest wall: No tenderness.  Abdominal:     General:  Bowel sounds are normal. There is no distension or abdominal bruit.     Palpations: Abdomen is soft. There is no hepatomegaly, splenomegaly, mass or pulsatile mass.     Tenderness: There is no abdominal tenderness.  Musculoskeletal:        General: Normal range of motion.  Lymphadenopathy:     Cervical: No cervical adenopathy.  Skin:    General: Skin is warm and dry.  Neurological:     Mental Status: She is alert and oriented to person, place, and time.     Deep Tendon Reflexes: Reflexes are normal and symmetric.  Psychiatric:        Behavior: Behavior normal.        Thought Content: Thought content normal.        Judgment: Judgment normal.    BP (!) 136/92   Pulse 81   Temp 97.6 F (36.4 C) (Temporal)   Resp 20   Ht 5\' 1"  (1.549 m)   Wt 300 lb (136.1 kg)   LMP 09/13/2012   SpO2 100%   BMI 56.68 kg/m        Assessment & Plan:   Marie Carter in today with chief complaint of Pain Management   1. Fibromyalgia Keep muscles warm - traMADol (ULTRAM) 50 MG tablet; Take 1 tablet (50 mg total) by mouth every 8 (eight) hours as needed.  Dispense: 90 tablet; Refill: 0 - traMADol (ULTRAM) 50 MG tablet; Take 1 tablet (50  mg total) by mouth every 8 (eight) hours as needed.  Dispense: 90 tablet; Refill: 0 - traMADol (ULTRAM) 50 MG tablet; Take 1 tablet (50 mg total) by mouth every 8 (eight) hours as needed.  Dispense: 90 tablet; Refill: 0    The above assessment and management plan was discussed with the patient. The patient verbalized understanding of and has agreed to the management plan. Patient is aware to call the clinic if symptoms persist or worsen. Patient is aware when to return to the clinic for a follow-up visit. Patient educated on when it is appropriate to go to the emergency department.   Mary-Margaret Daphine Deutscher, FNP

## 2021-04-28 ENCOUNTER — Encounter: Payer: Self-pay | Admitting: Nurse Practitioner

## 2021-04-28 ENCOUNTER — Ambulatory Visit: Admitting: Nurse Practitioner

## 2021-04-28 DIAGNOSIS — J40 Bronchitis, not specified as acute or chronic: Secondary | ICD-10-CM | POA: Diagnosis not present

## 2021-04-28 MED ORDER — AZITHROMYCIN 250 MG PO TABS: ORAL_TABLET | ORAL | 0 refills | Status: DC

## 2021-04-28 MED ORDER — PROMETHAZINE-DM 6.25-15 MG/5ML PO SYRP
5.0000 mL | ORAL_SOLUTION | Freq: Four times a day (QID) | ORAL | 0 refills | Status: DC | PRN
Start: 2021-04-28 — End: 2021-07-03

## 2021-04-28 MED ORDER — PREDNISONE 20 MG PO TABS: 40.0000 mg | ORAL_TABLET | Freq: Every day | ORAL | 0 refills | Status: AC

## 2021-04-28 NOTE — Progress Notes (Signed)
Virtual Visit  Note Due to COVID-19 pandemic this visit was conducted virtually. This visit type was conducted due to national recommendations for restrictions regarding the COVID-19 Pandemic (e.g. social distancing, sheltering in place) in an effort to limit this patient's exposure and mitigate transmission in our community. All issues noted in this document were discussed and addressed.  A physical exam was not performed with this format.  I connected with Marie Carter on 04/28/21 at 1:02 by telephone and verified that I am speaking with the correct person using two identifiers. Marie Carter is currently located at home and no one is currently with her during visit. The provider, Mary-Margaret Daphine Deutscher, FNP is located in their office at time of visit.  I discussed the limitations, risks, security and privacy concerns of performing an evaluation and management service by telephone and the availability of in person appointments. I also discussed with the patient that there may be a patient responsible charge related to this service. The patient expressed understanding and agreed to proceed.   History and Present Illness:  Patient has cough and congestion that started bad on Sunday. Has been graudaully coming on for over a week. She has bene using OTC cough meds .     Review of Systems  Constitutional:  Positive for malaise/fatigue. Negative for chills and fever.  HENT:  Positive for congestion and sore throat.   Respiratory:  Positive for cough, sputum production and wheezing. Negative for shortness of breath.   Neurological:  Positive for dizziness and headaches.    Observations/Objective: Alert and oriented- answers all questions appropriately No distress Can hardly hear voice Deep wet cough  Assessment and Plan: Marie Carter in today with chief complaint of No chief complaint on file.   1. Bronchitis 1. Take meds as prescribed 2. Use a cool mist humidifier especially during the  winter months and when heat has been humid. 3. Use saline nose sprays frequently 4. Saline irrigations of the nose can be very helpful if done frequently.  * 4X daily for 1 week*  * Use of a nettie pot can be helpful with this. Follow directions with this* 5. Drink plenty of fluids 6. Keep thermostat turn down low 7.For any cough or congestion-promethazine as ordered- sedation precautions. 8. For fever or aces or pains- take tylenol or ibuprofen appropriate for age and weight.  * for fevers greater than 101 orally you may alternate ibuprofen and tylenol every  3 hours.    - azithromycin (ZITHROMAX Z-PAK) 250 MG tablet; As directed  Dispense: 6 tablet; Refill: 0 - predniSONE (DELTASONE) 20 MG tablet; Take 2 tablets (40 mg total) by mouth daily with breakfast for 5 days. 2 po daily for 5 days  Dispense: 10 tablet; Refill: 0 - promethazine-dextromethorphan (PROMETHAZINE-DM) 6.25-15 MG/5ML syrup; Take 5 mLs by mouth 4 (four) times daily as needed for cough.  Dispense: 118 mL; Refill: 0    Follow Up Instructions: prn    I discussed the assessment and treatment plan with the patient. The patient was provided an opportunity to ask questions and all were answered. The patient agreed with the plan and demonstrated an understanding of the instructions.   The patient was advised to call back or seek an in-person evaluation if the symptoms worsen or if the condition fails to improve as anticipated.  The above assessment and management plan was discussed with the patient. The patient verbalized understanding of and has agreed to the management plan. Patient is aware to call the  clinic if symptoms persist or worsen. Patient is aware when to return to the clinic for a follow-up visit. Patient educated on when it is appropriate to go to the emergency department.   Time call ended:  1:15  I provided 13 minutes of  non face-to-face time during this encounter.    Mary-Margaret Daphine Deutscher, FNP

## 2021-06-19 ENCOUNTER — Other Ambulatory Visit: Payer: Self-pay | Admitting: Nurse Practitioner

## 2021-07-01 ENCOUNTER — Telehealth: Payer: Self-pay | Admitting: Nurse Practitioner

## 2021-07-01 ENCOUNTER — Other Ambulatory Visit: Payer: Self-pay | Admitting: Nurse Practitioner

## 2021-07-01 DIAGNOSIS — F5101 Primary insomnia: Secondary | ICD-10-CM

## 2021-07-01 NOTE — Telephone Encounter (Signed)
°  Prescription Request  07/01/2021  Is this a "Controlled Substance" medicine? yes  Have you seen your PCP in the last 2 weeks? No, has appt 2/13  If YES, route message to pool  -  If NO, patient needs to be scheduled for appointment.  What is the name of the medication or equipment? zolpidem (AMBIEN) 10 MG tablet  Have you contacted your pharmacy to request a refill? yes   Which pharmacy would you like this sent to? [facility] in [place]    Patient notified that their request is being sent to the clinical staff for review and that they should receive a response within 2 business days.

## 2021-07-01 NOTE — Telephone Encounter (Signed)
Explained to pt why refill was denied, controlled medications as a practice policy are not to be refilled in between visits. She was given 6 mos during her 01/07/21 visit. Appt made for 07/03/21 and explained to try to make her 6 mos follow-ups closer to the day that she was here so that she does not run out.

## 2021-07-03 ENCOUNTER — Ambulatory Visit: Admitting: Nurse Practitioner

## 2021-07-03 ENCOUNTER — Encounter: Payer: Self-pay | Admitting: Nurse Practitioner

## 2021-07-03 VITALS — BP 142/78 | HR 102 | Temp 97.9°F | Resp 20 | Ht 61.0 in | Wt 297.0 lb

## 2021-07-03 DIAGNOSIS — F3342 Major depressive disorder, recurrent, in full remission: Secondary | ICD-10-CM

## 2021-07-03 DIAGNOSIS — R3915 Urgency of urination: Secondary | ICD-10-CM

## 2021-07-03 DIAGNOSIS — M797 Fibromyalgia: Secondary | ICD-10-CM

## 2021-07-03 DIAGNOSIS — E782 Mixed hyperlipidemia: Secondary | ICD-10-CM

## 2021-07-03 DIAGNOSIS — G43009 Migraine without aura, not intractable, without status migrainosus: Secondary | ICD-10-CM

## 2021-07-03 DIAGNOSIS — G2581 Restless legs syndrome: Secondary | ICD-10-CM

## 2021-07-03 DIAGNOSIS — I10 Essential (primary) hypertension: Secondary | ICD-10-CM | POA: Diagnosis not present

## 2021-07-03 DIAGNOSIS — K219 Gastro-esophageal reflux disease without esophagitis: Secondary | ICD-10-CM

## 2021-07-03 DIAGNOSIS — J4520 Mild intermittent asthma, uncomplicated: Secondary | ICD-10-CM | POA: Diagnosis not present

## 2021-07-03 DIAGNOSIS — F5101 Primary insomnia: Secondary | ICD-10-CM

## 2021-07-03 MED ORDER — DULOXETINE HCL 60 MG PO CPEP: 60.0000 mg | ORAL_CAPSULE | Freq: Every day | ORAL | 1 refills | Status: DC

## 2021-07-03 MED ORDER — GABAPENTIN 300 MG PO CAPS: 300.0000 mg | ORAL_CAPSULE | Freq: Three times a day (TID) | ORAL | 1 refills | Status: DC

## 2021-07-03 MED ORDER — PANTOPRAZOLE SODIUM 40 MG PO TBEC: 40.0000 mg | DELAYED_RELEASE_TABLET | Freq: Every day | ORAL | 1 refills | Status: DC

## 2021-07-03 MED ORDER — TOPIRAMATE 50 MG PO TABS: 50.0000 mg | ORAL_TABLET | Freq: Three times a day (TID) | ORAL | 1 refills | Status: DC

## 2021-07-03 MED ORDER — PRAMIPEXOLE DIHYDROCHLORIDE 1 MG PO TABS: 1.0000 mg | ORAL_TABLET | Freq: Two times a day (BID) | ORAL | 1 refills | Status: DC

## 2021-07-03 MED ORDER — ROSUVASTATIN CALCIUM 20 MG PO TABS: ORAL_TABLET | ORAL | 1 refills | Status: DC

## 2021-07-03 MED ORDER — TRAMADOL HCL 50 MG PO TABS: 50.0000 mg | ORAL_TABLET | Freq: Three times a day (TID) | ORAL | 0 refills | Status: DC | PRN

## 2021-07-03 MED ORDER — AMLODIPINE BESYLATE 5 MG PO TABS: 5.0000 mg | ORAL_TABLET | Freq: Every day | ORAL | 1 refills | Status: DC

## 2021-07-03 MED ORDER — OMEPRAZOLE 40 MG PO CPDR: 40.0000 mg | DELAYED_RELEASE_CAPSULE | Freq: Every day | ORAL | 1 refills | Status: DC

## 2021-07-03 MED ORDER — FESOTERODINE FUMARATE ER 8 MG PO TB24: 8.0000 mg | ORAL_TABLET | Freq: Every day | ORAL | 1 refills | Status: DC

## 2021-07-03 MED ORDER — LISINOPRIL-HYDROCHLOROTHIAZIDE 20-12.5 MG PO TABS: 2.0000 | ORAL_TABLET | Freq: Every day | ORAL | 1 refills | Status: DC

## 2021-07-03 MED ORDER — BACLOFEN 10 MG PO TABS: 10.0000 mg | ORAL_TABLET | Freq: Three times a day (TID) | ORAL | 11 refills | Status: DC

## 2021-07-03 MED ORDER — ZOLPIDEM TARTRATE 10 MG PO TABS: 10.0000 mg | ORAL_TABLET | Freq: Every day | ORAL | 5 refills | Status: DC

## 2021-07-03 NOTE — Patient Instructions (Signed)

## 2021-07-03 NOTE — Progress Notes (Signed)
Subjective:    Patient ID: Marciel Offenberger, female    DOB: 1961/01/24, 61 y.o.   MRN: [id number]  Chief Complaint: medical management of chronic issues     HPI:  Amaiyah Nordhoff is a 61 y.o. who identifies as a female who was assigned female at birth.   Social history: Lives with: husband Work history: disability   Comes in today for follow up of the following chronic medical issues:  1. Primary hypertension No c/o chest bpain, sob or  headache. Doe snot check blood pressure at home. BP Readings from Last 3 Encounters:  04/15/21 (!) 136/92  01/07/21 (!) 138/101  10/08/20 (!) 158/94     2. Mixed hyperlipidemia Does not watch diet and does little to no exercise. Lab Results  Component Value Date   CHOL 248 (H) 01/07/2021   HDL 55 01/07/2021   LDLCALC 171 (H) 01/07/2021   TRIG 125 01/07/2021   CHOLHDL 4.5 (H) 01/07/2021     3. Migraine without aura and without status migrainosus, not intractable Still has weekly migraines. But they are better on preventative meds   4. Mild intermittent chronic asthma without complication Has not needed albuterol much. She doe snot get out of the house very much so therefore her breathing is good.  5. Gastroesophageal reflux disease without esophagitis I son protonix and omepraxole. That seems to work well for her.  6. Recurrent major depressive disorder, in full remission ([place]) Cymbalta helps with her depression. Depression screen [facility] 2/9 07/03/2021 04/15/2021 10/08/2020  Decreased Interest 1 0 0  Down, Depressed, Hopeless 1 0 0  PHQ - 2 Score 2 0 0  Altered sleeping 1 0 -  Tired, decreased energy 0 0 -  Change in appetite 2 1 -  Feeling bad or failure about yourself  0 1 -  Trouble concentrating 0 0 -  Moving slowly or fidgety/restless 1 0 -  Suicidal thoughts 0 0 -  PHQ-9 Score 6 2 -  Difficult doing work/chores Not difficult at all Not difficult at all -  Some recent data might be hidden    7. RLS (restless legs  syndrome) Is on mirapex nightly and is doing well  8. Primary insomnia Is on ambien to sleep at night. Sleeps about 6-7 hours a night.  9. Fibromyalgia Is on cymbalta and neurontin which help but still has to have her pain medication. Pain assessment: Cause of pain- fibromyalgis Pain location- lower back Pain on scale of 1-10- mainly in lower back but can move to other areas. Frequency- daily What increases pain-to much activity makes her pain worse. What makes pain Better-meds help Effects on ADL - none on most days Any change in general medical condition-none  Current opioids rx- ultram # meds rx- 90 Effectiveness of current meds-helps Adverse reactions from pain meds-none Morphine equivalent- 15MME  Pill count performed-No Last drug screen - 07/11/20 ( high risk q33m moderate risk q673mlow risk yearly ) Urine drug screen today- No [place]  If yes were their any concerning findings? - no   Overdose risk: 1    Pain contract signed on: 04/17/21   10. Severe obesity (BMI >= 40) (HCC) No recent weight changes   New complaints: None today  Allergies  Allergen Reactions   Bee Pollen    Butrans [Buprenorphine] Nausea And Vomiting    patch   Coconut Oil    Mucinex [Guaifenesin Er] Rash   Outpatient Encounter Medications as of 07/03/2021  Medication  Sig   amLODipine (NORVASC) 5 MG tablet Take 1 tablet (5 mg total) by mouth daily.   azithromycin (ZITHROMAX Z-PAK) 250 MG tablet As directed   baclofen (LIORESAL) 10 MG tablet Take 1 tablet (10 mg total) by mouth 3 (three) times daily.   DULoxetine (CYMBALTA) 60 MG capsule Take 1 capsule (60 mg total) by mouth daily.   EPINEPHrine 0.3 mg/0.3 mL IJ SOAJ injection Inject 0.3 mLs (0.3 mg total) into the muscle once.   fesoterodine (TOVIAZ) 8 MG TB24 tablet Take 1 tablet (8 mg total) by mouth daily.   gabapentin (NEURONTIN) 300 MG capsule Take 1 capsule (300 mg total) by mouth 3 (three) times daily.    lisinopril-hydrochlorothiazide (ZESTORETIC) 20-12.5 MG tablet Take 2 tablets by mouth daily.   medroxyPROGESTERone (PROVERA) 10 MG tablet TAKE 1 TABLET DAILY   omeprazole (PRILOSEC) 40 MG capsule Take 1 capsule (40 mg total) by mouth daily.   pantoprazole (PROTONIX) 40 MG tablet Take 1 tablet (40 mg total) by mouth daily.   pramipexole (MIRAPEX) 1 MG tablet Take 1 tablet (1 mg total) by mouth 2 (two) times daily.   PROAIR HFA 108 (90 Base) MCG/ACT inhaler USE 2 INHALATIONS EVERY 6 HOURS AS NEEDED FOR WHEEZING   promethazine-dextromethorphan (PROMETHAZINE-DM) 6.25-15 MG/5ML syrup Take 5 mLs by mouth 4 (four) times daily as needed for cough.   rosuvastatin (CRESTOR) 20 MG tablet TAKE 1 TABLET DAILY. REPLACES LIPITOR   topiramate (TOPAMAX) 50 MG tablet Take 1 tablet (50 mg total) by mouth 3 (three) times daily.   traMADol (ULTRAM) 50 MG tablet Take 1 tablet (50 mg total) by mouth every 8 (eight) hours as needed.   traMADol (ULTRAM) 50 MG tablet Take 1 tablet (50 mg total) by mouth every 8 (eight) hours as needed.   traMADol (ULTRAM) 50 MG tablet Take 1 tablet (50 mg total) by mouth every 8 (eight) hours as needed.   zolpidem (AMBIEN) 10 MG tablet Take 1 tablet (10 mg total) by mouth at bedtime.   No facility-administered encounter medications on file as of 07/03/2021.    Past Surgical History:  Procedure Laterality Date   BREAST BIOPSY Left    x 2 / 10-12 years ago / benign   BREAST SURGERY     biopsy times 2   JOINT REPLACEMENT Right    JOINT REPLACEMENT Left    SPINE SURGERY     cervical spine    Family History  Problem Relation Age of Onset   Diabetes Mother    Heart attack Father 73   Diabetes Father    Breast cancer Maternal Grandmother    Stroke Maternal Grandmother    Colon cancer Other        family history         Review of Systems  Constitutional:  Negative for diaphoresis.  Eyes:  Negative for pain.  Respiratory:  Negative for shortness of breath.    Cardiovascular:  Positive for leg swelling. Negative for chest pain and palpitations.  Gastrointestinal:  Negative for abdominal pain.  Endocrine: Negative for polydipsia.  Musculoskeletal:  Positive for myalgias.  Skin:  Negative for rash.  Neurological:  Negative for dizziness, weakness and headaches.  Hematological:  Does not bruise/bleed easily.  All other systems reviewed and are negative.     Objective:   Physical Exam Vitals and nursing note reviewed.  Constitutional:      General: She is not in acute distress.    Appearance: Normal appearance. She is well-developed.  HENT:     Head: Normocephalic.     Right Ear: Tympanic membrane normal.     Left Ear: Tympanic membrane normal.     Nose: Nose normal.     Mouth/Throat:     Mouth: Mucous membranes are moist.  Eyes:     Pupils: Pupils are equal, round, and reactive to light.  Neck:     Vascular: No carotid bruit or JVD.  Cardiovascular:     Rate and Rhythm: Normal rate and regular rhythm.     Heart sounds: Normal heart sounds.  Pulmonary:     Effort: Pulmonary effort is normal. No respiratory distress.     Breath sounds: Normal breath sounds. No wheezing or rales.  Chest:     Chest wall: No tenderness.  Abdominal:     General: Bowel sounds are normal. There is no distension or abdominal bruit.     Palpations: Abdomen is soft. There is no hepatomegaly, splenomegaly, mass or pulsatile mass.     Tenderness: There is no abdominal tenderness.  Musculoskeletal:        General: Normal range of motion.     Cervical back: Normal range of motion and neck supple.     Comments: Walking with walker  Lymphadenopathy:     Cervical: No cervical adenopathy.  Skin:    General: Skin is warm and dry.  Neurological:     Mental Status: She is alert and oriented to person, place, and time.     Deep Tendon Reflexes: Reflexes are normal and symmetric.  Psychiatric:        Behavior: Behavior normal.        Thought Content: Thought  content normal.        Judgment: Judgment normal.    BP (!) 142/78    Pulse (!) 102    Temp 97.9 F (36.6 C) (Temporal)    Resp 20    Ht 5' 1"  (1.549 m)    Wt 297 lb (134.7 kg)    LMP 09/13/2012    SpO2 96%    BMI 56.12 kg/m         Assessment & Plan:  Halana Deisher comes in today with chief complaint of Medical Management of Chronic Issues   Diagnosis and orders addressed:  1. Primary hypertension Low sodium diet - lisinopril-hydrochlorothiazide (ZESTORETIC) 20-12.5 MG tablet; Take 2 tablets by mouth daily.  Dispense: 180 tablet; Refill: 1 - amLODipine (NORVASC) 5 MG tablet; Take 1 tablet (5 mg total) by mouth daily.  Dispense: 90 tablet; Refill: 1 - CBC with Differential/Platelet - CMP14+EGFR  2. Mixed hyperlipidemia Low fat diet - rosuvastatin (CRESTOR) 20 MG tablet; TAKE 1 TABLET DAILY. REPLACES LIPITOR  Dispense: 90 tablet; Refill: 1 - Lipid panel  3. Migraine without aura and without status migrainosus, not intractable Avoid caffeine - topiramate (TOPAMAX) 50 MG tablet; Take 1 tablet (50 mg total) by mouth 3 (three) times daily.  Dispense: 270 tablet; Refill: 1  4. Mild intermittent chronic asthma without complication  5. Gastroesophageal reflux disease without esophagitis Avoid spicy foods Do not eat 2 hours prior to bedtime - pantoprazole (PROTONIX) 40 MG tablet; Take 1 tablet (40 mg total) by mouth daily.  Dispense: 90 tablet; Refill: 1 - omeprazole (PRILOSEC) 40 MG capsule; Take 1 capsule (40 mg total) by mouth daily.  Dispense: 90 capsule; Refill: 1  6. Recurrent major depressive disorder, in full remission [facility]) Stress management  7. RLS (restless legs syndrome) Keep leg swarm at night - pramipexole (MIRAPEX)  1 MG tablet; Take 1 tablet (1 mg total) by mouth 2 (two) times daily.  Dispense: 180 tablet; Refill: 1  8. Primary insomnia Bedtime routine - zolpidem (AMBIEN) 10 MG tablet; Take 1 tablet (10 mg total) by mouth at bedtime.  Dispense: 30 tablet;  Refill: 5  9. Fibromyalgia Keep muscles warm - traMADol (ULTRAM) 50 MG tablet; Take 1 tablet (50 mg total) by mouth every 8 (eight) hours as needed.  Dispense: 90 tablet; Refill: 0 - traMADol (ULTRAM) 50 MG tablet; Take 1 tablet (50 mg total) by mouth every 8 (eight) hours as needed.  Dispense: 90 tablet; Refill: 0 - traMADol (ULTRAM) 50 MG tablet; Take 1 tablet (50 mg total) by mouth every 8 (eight) hours as needed.  Dispense: 90 tablet; Refill: 0 - DULoxetine (CYMBALTA) 60 MG capsule; Take 1 capsule (60 mg total) by mouth daily.  Dispense: 90 capsule; Refill: 1 - gabapentin (NEURONTIN) 300 MG capsule; Take 1 capsule (300 mg total) by mouth 3 (three) times daily.  Dispense: 540 capsule; Refill: 1 - baclofen (LIORESAL) 10 MG tablet; Take 1 tablet (10 mg total) by mouth 3 (three) times daily.  Dispense: 90 tablet; Refill: 11  10. Severe obesity (BMI >= 40) (HCC) Discussed diet and exercise for person with BMI >25 Will recheck weight in 3-6 months   11. Urinary urgency - fesoterodine (TOVIAZ) 8 MG TB24 tablet; Take 1 tablet (8 mg total) by mouth daily.  Dispense: 90 tablet; Refill: 1   Labs pending Health Maintenance reviewed Diet and exercise encouraged  Follow up plan: 3 months pain management   Mary-Margaret Hassell Done, FNP

## 2021-07-04 LAB — CMP14+EGFR
ALT: 49 IU/L — ABNORMAL HIGH (ref 0–32)
AST: 23 IU/L (ref 0–40)
Albumin/Globulin Ratio: 1.8 (ref 1.2–2.2)
Albumin: 4.9 g/dL — ABNORMAL HIGH (ref 3.8–4.8)
Alkaline Phosphatase: 132 IU/L — ABNORMAL HIGH (ref 44–121)
BUN/Creatinine Ratio: 18 (ref 12–28)
BUN: 17 mg/dL (ref 8–27)
Bilirubin Total: 0.4 mg/dL (ref 0.0–1.2)
CO2: 23 mmol/L (ref 20–29)
Calcium: 9.5 mg/dL (ref 8.7–10.3)
Chloride: 102 mmol/L (ref 96–106)
Creatinine, Ser: 0.93 mg/dL (ref 0.57–1.00)
Globulin, Total: 2.8 g/dL (ref 1.5–4.5)
Glucose: 119 mg/dL — ABNORMAL HIGH (ref 70–99)
Potassium: 4.3 mmol/L (ref 3.5–5.2)
Sodium: 138 mmol/L (ref 134–144)
Total Protein: 7.7 g/dL (ref 6.0–8.5)
eGFR: 69 mL/min/{1.73_m2} (ref >59)

## 2021-07-04 LAB — CBC WITH DIFFERENTIAL/PLATELET
Basophils Absolute: 0.1 10*3/uL (ref 0.0–0.2)
Basos: 1 %
EOS (ABSOLUTE): 0.2 10*3/uL (ref 0.0–0.4)
Eos: 2 %
Hematocrit: 45.3 % (ref 34.0–46.6)
Hemoglobin: 15.3 g/dL (ref 11.1–15.9)
Immature Grans (Abs): 0 10*3/uL (ref 0.0–0.1)
Immature Granulocytes: 0 %
Lymphocytes Absolute: 2.9 10*3/uL (ref 0.7–3.1)
Lymphs: 33 %
MCH: 31 pg (ref 26.6–33.0)
MCHC: 33.8 g/dL (ref 31.5–35.7)
MCV: 92 fL (ref 79–97)
Monocytes Absolute: 0.5 10*3/uL (ref 0.1–0.9)
Monocytes: 6 %
Neutrophils Absolute: 5 10*3/uL (ref 1.4–7.0)
Neutrophils: 58 %
Platelets: 441 10*3/uL (ref 150–450)
RBC: 4.94 x10E6/uL (ref 3.77–5.28)
RDW: 12.9 % (ref 11.7–15.4)
WBC: 8.7 10*3/uL (ref 3.4–10.8)

## 2021-07-04 LAB — LIPID PANEL
Chol/HDL Ratio: 4.3 ratio (ref 0.0–4.4)
Cholesterol, Total: 265 mg/dL — ABNORMAL HIGH (ref 100–199)
HDL: 61 mg/dL (ref >39)
LDL Chol Calc (NIH): 185 mg/dL — ABNORMAL HIGH (ref 0–99)
Triglycerides: 110 mg/dL (ref 0–149)
VLDL Cholesterol Cal: 19 mg/dL (ref 5–40)

## 2021-07-14 ENCOUNTER — Ambulatory Visit: Admitting: Nurse Practitioner

## 2021-07-21 ENCOUNTER — Other Ambulatory Visit: Payer: Self-pay | Admitting: Nurse Practitioner

## 2021-10-02 ENCOUNTER — Ambulatory Visit: Admitting: Nurse Practitioner

## 2021-10-07 ENCOUNTER — Ambulatory Visit: Admitting: Nurse Practitioner

## 2021-10-13 ENCOUNTER — Ambulatory Visit (INDEPENDENT_AMBULATORY_CARE_PROVIDER_SITE_OTHER): Admitting: Nurse Practitioner

## 2021-10-13 ENCOUNTER — Encounter: Payer: Self-pay | Admitting: Nurse Practitioner

## 2021-10-13 VITALS — BP 149/93 | HR 100 | Temp 97.8°F | Resp 20 | Ht 61.0 in | Wt 298.0 lb

## 2021-10-13 DIAGNOSIS — G2581 Restless legs syndrome: Secondary | ICD-10-CM

## 2021-10-13 DIAGNOSIS — I10 Essential (primary) hypertension: Secondary | ICD-10-CM | POA: Diagnosis not present

## 2021-10-13 DIAGNOSIS — F5101 Primary insomnia: Secondary | ICD-10-CM

## 2021-10-13 DIAGNOSIS — G43009 Migraine without aura, not intractable, without status migrainosus: Secondary | ICD-10-CM

## 2021-10-13 DIAGNOSIS — M797 Fibromyalgia: Secondary | ICD-10-CM

## 2021-10-13 DIAGNOSIS — K219 Gastro-esophageal reflux disease without esophagitis: Secondary | ICD-10-CM

## 2021-10-13 DIAGNOSIS — F3342 Major depressive disorder, recurrent, in full remission: Secondary | ICD-10-CM

## 2021-10-13 DIAGNOSIS — E782 Mixed hyperlipidemia: Secondary | ICD-10-CM | POA: Diagnosis not present

## 2021-10-13 DIAGNOSIS — J4520 Mild intermittent asthma, uncomplicated: Secondary | ICD-10-CM

## 2021-10-13 MED ORDER — GABAPENTIN 300 MG PO CAPS: 300.0000 mg | ORAL_CAPSULE | Freq: Three times a day (TID) | ORAL | 1 refills | Status: DC

## 2021-10-13 MED ORDER — TRAMADOL HCL 50 MG PO TABS: 50.0000 mg | ORAL_TABLET | Freq: Three times a day (TID) | ORAL | 0 refills | Status: DC | PRN

## 2021-10-13 MED ORDER — PRAMIPEXOLE DIHYDROCHLORIDE 1 MG PO TABS: 1.0000 mg | ORAL_TABLET | Freq: Two times a day (BID) | ORAL | 1 refills | Status: DC

## 2021-10-13 MED ORDER — DULOXETINE HCL 60 MG PO CPEP: 60.0000 mg | ORAL_CAPSULE | Freq: Every day | ORAL | 1 refills | Status: DC

## 2021-10-13 MED ORDER — TOPIRAMATE 50 MG PO TABS: 50.0000 mg | ORAL_TABLET | Freq: Three times a day (TID) | ORAL | 1 refills | Status: DC

## 2021-10-13 MED ORDER — PANTOPRAZOLE SODIUM 40 MG PO TBEC: 40.0000 mg | DELAYED_RELEASE_TABLET | Freq: Every day | ORAL | 1 refills | Status: DC

## 2021-10-13 MED ORDER — ROSUVASTATIN CALCIUM 20 MG PO TABS: ORAL_TABLET | ORAL | 1 refills | Status: DC

## 2021-10-13 MED ORDER — OMEPRAZOLE 40 MG PO CPDR: 40.0000 mg | DELAYED_RELEASE_CAPSULE | Freq: Every day | ORAL | 1 refills | Status: DC

## 2021-10-13 MED ORDER — LISINOPRIL-HYDROCHLOROTHIAZIDE 20-12.5 MG PO TABS: 2.0000 | ORAL_TABLET | Freq: Every day | ORAL | 1 refills | Status: DC

## 2021-10-13 MED ORDER — BACLOFEN 10 MG PO TABS: 10.0000 mg | ORAL_TABLET | Freq: Three times a day (TID) | ORAL | 11 refills | Status: DC

## 2021-10-13 MED ORDER — ZOLPIDEM TARTRATE 10 MG PO TABS: 10.0000 mg | ORAL_TABLET | Freq: Every day | ORAL | 5 refills | Status: DC

## 2021-10-13 MED ORDER — AMLODIPINE BESYLATE 5 MG PO TABS: 5.0000 mg | ORAL_TABLET | Freq: Every day | ORAL | 1 refills | Status: DC

## 2021-10-13 NOTE — Patient Instructions (Signed)

## 2021-10-13 NOTE — Progress Notes (Signed)
? ?Subjective:  ? ? Patient ID: Marie Carter, female    DOB: 1960/10/29, 61 y.o.   MRN: [id number] ? ? ?Chief Complaint: medical management of chronic issues  ?  ? ?HPI: ? ?Marie Carter is a 61 y.o. who identifies as a female who was assigned female at birth.  ? ?Social history: ?Lives with: husband ?Work history: disability ? ? ?Comes in today for follow up of the following chronic medical issues: ? ?1. Primary hypertension ?No c/o chest pain, or sob. She does not check her blood pressure very often at home. ?BP Readings from Last 3 Encounters:  ?07/03/21 (!) 142/78  ?04/15/21 (!) 136/92  ?01/07/21 (!) 138/101  ? ? ? ?2. Mixed hyperlipidemia ?Does not watch diet and does no exercise. ?Lab Results  ?Component Value Date  ? CHOL 265 (H) 07/03/2021  ? HDL 61 07/03/2021  ? LDLCALC 185 (H) 07/03/2021  ? TRIG 110 07/03/2021  ? CHOLHDL 4.3 07/03/2021  ? ? ? ?3. Gastroesophageal reflux disease without esophagitis ?Is on protonix daily and that usually keeps symptoms under control. ? ?4. Migraine without aura and without status migrainosus, not intractable ?Has about 1 a month. Is tolerable ? ?5. Mild intermittent chronic asthma without complication ?Has been good lalely. Has not need to use albuterol. ? ?6. Recurrent major depressive disorder, in full remission ([place]) ?Is on cymbalta and is doing well. ? ?  10/13/2021  ?  3:33 PM 07/03/2021  ?  2:39 PM 04/15/2021  ? 11:06 AM  ?Depression screen PHQ 2/9  ?Decreased Interest 0 1 0  ?Down, Depressed, Hopeless 0 1 0  ?PHQ - 2 Score 0 2 0  ?Altered sleeping 0 1 0  ?Tired, decreased energy 0 0 0  ?Change in appetite 0 2 1  ?Feeling bad or failure about yourself  1 0 1  ?Trouble concentrating 0 0 0  ?Moving slowly or fidgety/restless 0 1 0  ?Suicidal thoughts 0 0 0  ?PHQ-9 Score 1 6 2   ?Difficult doing work/chores Not difficult at all Not difficult at all Not difficult at all  ? ? ? ?7. Primary insomnia ?Is on ambien to sleep= sleeps about 8 hours a night ? ?8. RLS (restless legs  syndrome) ?Mirapex is working well for her ? ?9. Fibromyalgia ?Pain assessment: ?Cause of pain- fibromyalgia ?Pain location- varies from day to day ?Pain on scale of 1-10- 8/10 currently ?Frequency- daily ?What increases pain-to much activity ?What makes pain Better-rest ?Effects on ADL - none ?Any change in general medical condition-none ? ?Current opioids rx- ultram ?# meds rx- 32 ?Effectiveness of current meds- helps- but always has some pain ?Adverse reactions from pain meds-none ?Morphine equivalent- 15MME ? ?Pill count performed-No ?Last drug screen - 07/11/20 ?( high risk q85m, moderate risk q66m, low risk yearly ) ?Urine drug screen today- Yes ?Was the [place] reviewed- yes ? If yes were their any concerning findings? - no ? ? ?Overdose risk: 1 ? ? ?Pain contract signed on:04/17/21- will do drug screen when signs next contract ? ? ?10. Severe obesity (BMI >= 40) (HCC) ?Wt Readings from Last 3 Encounters:  ?10/13/21 298 lb (135.2 kg)  ?07/03/21 297 lb (134.7 kg)  ?04/15/21 300 lb (136.1 kg)  ? ?BMI Readings from Last 3 Encounters:  ?10/13/21 56.31 kg/m?  ?07/03/21 56.12 kg/m?  ?04/15/21 56.68 kg/m?  ? ? ? ? ?New complaints: ?None today ? ?Allergies  ?Allergen Reactions  ? Bee Pollen   ? Butrans [Buprenorphine] Nausea And Vomiting  ?  patch  ? Coconut Oil   ? Mucinex [Guaifenesin Er] Rash  ? ?Outpatient Encounter Medications as of 10/13/2021  ?Medication Sig  ? albuterol (VENTOLIN HFA) 108 (90 Base) MCG/ACT inhaler USE 2 INHALATIONS EVERY 6 HOURS AS NEEDED FOR WHEEZING  ? amLODipine (NORVASC) 5 MG tablet Take 1 tablet (5 mg total) by mouth daily.  ? baclofen (LIORESAL) 10 MG tablet Take 1 tablet (10 mg total) by mouth 3 (three) times daily.  ? DULoxetine (CYMBALTA) 60 MG capsule Take 1 capsule (60 mg total) by mouth daily.  ? EPINEPHrine 0.3 mg/0.3 mL IJ SOAJ injection Inject 0.3 mLs (0.3 mg total) into the muscle once.  ? fesoterodine (TOVIAZ) 8 MG TB24 tablet Take 1 tablet (8 mg total) by mouth daily.  ?  gabapentin (NEURONTIN) 300 MG capsule Take 1 capsule (300 mg total) by mouth 3 (three) times daily.  ? lisinopril-hydrochlorothiazide (ZESTORETIC) 20-12.5 MG tablet Take 2 tablets by mouth daily.  ? medroxyPROGESTERone (PROVERA) 10 MG tablet TAKE 1 TABLET DAILY  ? omeprazole (PRILOSEC) 40 MG capsule Take 1 capsule (40 mg total) by mouth daily.  ? pantoprazole (PROTONIX) 40 MG tablet Take 1 tablet (40 mg total) by mouth daily.  ? pramipexole (MIRAPEX) 1 MG tablet Take 1 tablet (1 mg total) by mouth 2 (two) times daily.  ? rosuvastatin (CRESTOR) 20 MG tablet TAKE 1 TABLET DAILY. REPLACES LIPITOR  ? topiramate (TOPAMAX) 50 MG tablet Take 1 tablet (50 mg total) by mouth 3 (three) times daily.  ? traMADol (ULTRAM) 50 MG tablet Take 1 tablet (50 mg total) by mouth every 8 (eight) hours as needed.  ? traMADol (ULTRAM) 50 MG tablet Take 1 tablet (50 mg total) by mouth every 8 (eight) hours as needed.  ? traMADol (ULTRAM) 50 MG tablet Take 1 tablet (50 mg total) by mouth every 8 (eight) hours as needed.  ? zolpidem (AMBIEN) 10 MG tablet Take 1 tablet (10 mg total) by mouth at bedtime.  ? ?No facility-administered encounter medications on file as of 10/13/2021.  ? ? ?Past Surgical History:  ?Procedure Laterality Date  ? BREAST BIOPSY Left   ? x 2 / 10-12 years ago / benign  ? BREAST SURGERY    ? biopsy times 2  ? JOINT REPLACEMENT Right   ? JOINT REPLACEMENT Left   ? SPINE SURGERY    ? cervical spine  ? ? ?Family History  ?Problem Relation Age of Onset  ? Diabetes Mother   ? Heart attack Father 70  ? Diabetes Father   ? Breast cancer Maternal Grandmother   ? Stroke Maternal Grandmother   ? Colon cancer Other   ?     family history   ? ? ? ? ? ? ?Review of Systems  ?Constitutional:  Negative for diaphoresis.  ?Eyes:  Negative for pain.  ?Respiratory:  Negative for shortness of breath.   ?Cardiovascular:  Negative for chest pain, palpitations and leg swelling.  ?Gastrointestinal:  Negative for abdominal pain.  ?Endocrine:  Negative for polydipsia.  ?Skin:  Negative for rash.  ?Neurological:  Negative for dizziness, weakness and headaches.  ?Hematological:  Does not bruise/bleed easily.  ?All other systems reviewed and are negative. ? ?   ?Objective:  ? Physical Exam ?Vitals and nursing note reviewed.  ?Constitutional:   ?   General: She is not in acute distress. ?   Appearance: Normal appearance. She is well-developed.  ?HENT:  ?   Head: Normocephalic.  ?   Right Ear: Tympanic membrane  normal.  ?   Left Ear: Tympanic membrane normal.  ?   Nose: Nose normal.  ?   Mouth/Throat:  ?   Mouth: Mucous membranes are moist.  ?Eyes:  ?   Pupils: Pupils are equal, round, and reactive to light.  ?Neck:  ?   Vascular: No carotid bruit or JVD.  ?Cardiovascular:  ?   Rate and Rhythm: Normal rate and regular rhythm.  ?   Heart sounds: Normal heart sounds.  ?Pulmonary:  ?   Effort: Pulmonary effort is normal. No respiratory distress.  ?   Breath sounds: Normal breath sounds. No wheezing or rales.  ?Chest:  ?   Chest wall: No tenderness.  ?Abdominal:  ?   General: Bowel sounds are normal. There is no distension or abdominal bruit.  ?   Palpations: Abdomen is soft. There is no hepatomegaly, splenomegaly, mass or pulsatile mass.  ?   Tenderness: There is no abdominal tenderness.  ?Musculoskeletal:     ?   General: Normal range of motion.  ?   Cervical back: Normal range of motion and neck supple.  ?   Right lower leg: Edema (1+) present.  ?   Left lower leg: Edema (1+) present.  ?   Comments: Walking with walker  ?Lymphadenopathy:  ?   Cervical: No cervical adenopathy.  ?Skin: ?   General: Skin is warm and dry.  ?Neurological:  ?   Mental Status: She is alert and oriented to person, place, and time.  ?   Deep Tendon Reflexes: Reflexes are normal and symmetric.  ?Psychiatric:     ?   Behavior: Behavior normal.     ?   Thought Content: Thought content normal.     ?   Judgment: Judgment normal.  ? ? ?BP (!) 149/93   Pulse 100   Temp 97.8 ?F (36.6 ?C)  (Temporal)   Resp 20   Ht 5\' 1"  (1.549 m)   Wt 298 lb (135.2 kg)   LMP 09/13/2012   SpO2 91%   BMI 56.31 kg/m?  ? ? ? ?   ?Assessment & Plan:  ? ?Lilija Pittard comes in today with chief complaint of Medical Management of [place]

## 2021-12-17 ENCOUNTER — Other Ambulatory Visit: Payer: Self-pay | Admitting: Nurse Practitioner

## 2021-12-17 DIAGNOSIS — R3915 Urgency of urination: Secondary | ICD-10-CM

## 2022-01-13 ENCOUNTER — Ambulatory Visit: Admitting: Nurse Practitioner

## 2022-01-13 ENCOUNTER — Encounter: Payer: Self-pay | Admitting: Nurse Practitioner

## 2022-01-13 VITALS — BP 136/82 | HR 100 | Temp 98.0°F | Resp 20 | Ht 61.0 in | Wt 294.0 lb

## 2022-01-13 DIAGNOSIS — I10 Essential (primary) hypertension: Secondary | ICD-10-CM

## 2022-01-13 DIAGNOSIS — G2581 Restless legs syndrome: Secondary | ICD-10-CM

## 2022-01-13 DIAGNOSIS — K219 Gastro-esophageal reflux disease without esophagitis: Secondary | ICD-10-CM | POA: Diagnosis not present

## 2022-01-13 DIAGNOSIS — G43009 Migraine without aura, not intractable, without status migrainosus: Secondary | ICD-10-CM | POA: Diagnosis not present

## 2022-01-13 DIAGNOSIS — F5101 Primary insomnia: Secondary | ICD-10-CM

## 2022-01-13 DIAGNOSIS — M797 Fibromyalgia: Secondary | ICD-10-CM

## 2022-01-13 DIAGNOSIS — E782 Mixed hyperlipidemia: Secondary | ICD-10-CM | POA: Diagnosis not present

## 2022-01-13 DIAGNOSIS — F3342 Major depressive disorder, recurrent, in full remission: Secondary | ICD-10-CM

## 2022-01-13 MED ORDER — TRAMADOL HCL 50 MG PO TABS: 50.0000 mg | ORAL_TABLET | Freq: Three times a day (TID) | ORAL | 0 refills | Status: DC | PRN

## 2022-01-13 NOTE — Patient Instructions (Signed)
Insomnia Insomnia is a sleep disorder that makes it difficult to fall asleep or stay asleep. Insomnia can cause fatigue, low energy, difficulty concentrating, mood swings, and poor performance at work or school. There are three different ways to classify insomnia: Difficulty falling asleep. Difficulty staying asleep. Waking up too early in the morning. Any type of insomnia can be long-term (chronic) or short-term (acute). Both are common. Short-term insomnia usually lasts for 3 months or less. Chronic insomnia occurs at least three times a week for longer than 3 months. What are the causes? Insomnia may be caused by another condition, situation, or substance, such as: Having certain mental health conditions, such as anxiety and depression. Using caffeine, alcohol, tobacco, or drugs. Having gastrointestinal conditions, such as gastroesophageal reflux disease (GERD). Having certain medical conditions. These include: Asthma. Alzheimer's disease. Stroke. Chronic pain. An overactive thyroid gland (hyperthyroidism). Other sleep disorders, such as restless legs syndrome and sleep apnea. Menopause. Sometimes, the cause of insomnia may not be known. What increases the risk? Risk factors for insomnia include: Gender. Females are affected more often than males. Age. Insomnia is more common as people get older. Stress and certain medical and mental health conditions. Lack of exercise. Having an irregular work schedule. This may include working night shifts and traveling between different time zones. What are the signs or symptoms? If you have insomnia, the main symptom is having trouble falling asleep or having trouble staying asleep. This may lead to other symptoms, such as: Feeling tired or having low energy. Feeling nervous about going to sleep. Not feeling rested in the morning. Having trouble concentrating. Feeling irritable, anxious, or depressed. How is this diagnosed? This condition  may be diagnosed based on: Your symptoms and medical history. Your health care provider may ask about: Your sleep habits. Any medical conditions you have. Your mental health. A physical exam. How is this treated? Treatment for insomnia depends on the cause. Treatment may focus on treating an underlying condition that is causing the insomnia. Treatment may also include: Medicines to help you sleep. Counseling or therapy. Lifestyle adjustments to help you sleep better. Follow these instructions at home: Eating and drinking  Limit or avoid alcohol, caffeinated beverages, and products that contain nicotine and tobacco, especially close to bedtime. These can disrupt your sleep. Do not eat a large meal or eat spicy foods right before bedtime. This can lead to digestive discomfort that can make it hard for you to sleep. Sleep habits  Keep a sleep diary to help you and your health care provider figure out what could be causing your insomnia. Write down: When you sleep. When you wake up during the night. How well you sleep and how rested you feel the next day. Any side effects of medicines you are taking. What you eat and drink. Make your bedroom a dark, comfortable place where it is easy to fall asleep. Put up shades or blackout curtains to block light from outside. Use a white noise machine to block noise. Keep the temperature cool. Limit screen use before bedtime. This includes: Not watching TV. Not using your smartphone, tablet, or computer. Stick to a routine that includes going to bed and waking up at the same times every day and night. This can help you fall asleep faster. Consider making a quiet activity, such as reading, part of your nighttime routine. Try to avoid taking naps during the day so that you sleep better at night. Get out of bed if you are still awake after   15 minutes of trying to sleep. Keep the lights down, but try reading or doing a quiet activity. When you feel  sleepy, go back to bed. General instructions Take over-the-counter and prescription medicines only as told by your health care provider. Exercise regularly as told by your health care provider. However, avoid exercising in the hours right before bedtime. Use relaxation techniques to manage stress. Ask your health care provider to suggest some techniques that may work well for you. These may include: Breathing exercises. Routines to release muscle tension. Visualizing peaceful scenes. Make sure that you drive carefully. Do not drive if you feel very sleepy. Keep all follow-up visits. This is important. Contact a health care provider if: You are tired throughout the day. You have trouble in your daily routine due to sleepiness. You continue to have sleep problems, or your sleep problems get worse. Get help right away if: You have thoughts about hurting yourself or someone else. Get help right away if you feel like you may hurt yourself or others, or have thoughts about taking your own life. Go to your nearest emergency room or: Call 911. Call the National Suicide Prevention Lifeline at 1-800-273-8255 or 988. This is open 24 hours a day. Text the Crisis Text Line at 741741. Summary Insomnia is a sleep disorder that makes it difficult to fall asleep or stay asleep. Insomnia can be long-term (chronic) or short-term (acute). Treatment for insomnia depends on the cause. Treatment may focus on treating an underlying condition that is causing the insomnia. Keep a sleep diary to help you and your health care provider figure out what could be causing your insomnia. This information is not intended to replace advice given to you by your health care provider. Make sure you discuss any questions you have with your health care provider. Document Revised: 04/28/2021 Document Reviewed: 04/28/2021 Elsevier Patient Education  2023 [employer]  

## 2022-01-13 NOTE — Progress Notes (Signed)
Subjective:    Patient ID: Marie Carter, female    DOB: 1960-10-03, 61 y.o.   MRN: [id number]   Chief Complaint: medical management of chronic issues     HPI:  Marie Carter is a 61 y.o. who identifies as a female who was assigned female at birth.   Social history: Lives with: husband Work history: disability   Comes in today for follow up of the following chronic medical issues:  1. Primary hypertension No c/o chest pain or SOB. Does not check her blood pressure at home BP Readings from Last 3 Encounters:  10/13/21 (!) 149/93  07/03/21 (!) 142/78  04/15/21 (!) 136/92     2. Mixed hyperlipidemia Does not watch diet and does no dedicated exercise. Lab Results  Component Value Date   CHOL 265 (H) 07/03/2021   HDL 61 07/03/2021   LDLCALC 185 (H) 07/03/2021   TRIG 110 07/03/2021   CHOLHDL 4.3 07/03/2021   The 10-year ASCVD risk score (Arnett DK, et al., 2019) is: 8%   3. Migraine without aura and without status migrainosus, not intractable HAS ABOUT 1 A MOUNTH. IS TOLERABLE  4. Gastroesophageal reflux disease without esophagitis Is on protonix and omeprazole daily. Still has occasional symptoms.  5. Fibromyalgia Has constant pain Pain assessment: Cause of pain- fibromyalgia Pain location- varies from day to day Pain on scale of 1-10- 7-8/10 Frequency- daily What increases pain-to much activity What makes pain Better-rest Effects on ADL - none Any change in general medical condition-none  Current opioids rx- ultram TID # meds rx- 9 Effectiveness of current meds-helps Adverse reactions from pain meds-none Morphine equivalent- 15 MME  Pill count performed-No Last drug screen - 07/11/20 ( high risk q25m, moderate risk q61m, low risk yearly ) Urine drug screen today- Yes Was the [facility] reviewed- yes  If yes were their any concerning findings? - none   Overdose risk: 1  Pain contract signed on:04/17/21   6. Recurrent major depressive disorder, in full  remission (HCC) Takes cymbalta for her fibromyalgia which also helps with her depression.    01/13/2022    2:11 PM 10/13/2021    3:33 PM 07/03/2021    2:39 PM  Depression screen PHQ 2/9  Decreased Interest 0 0 1  Down, Depressed, Hopeless 0 0 1  PHQ - 2 Score 0 0 2  Altered sleeping 0 0 1  Tired, decreased energy 0 0 0  Change in appetite 0 0 2  Feeling bad or failure about yourself  0 1 0  Trouble concentrating 0 0 0  Moving slowly or fidgety/restless 0 0 1  Suicidal thoughts 0 0 0  PHQ-9 Score 0 1 6  Difficult doing work/chores Not difficult at all Not difficult at all Not difficult at all     7. Primary insomnia Takes ambien to sleep at night. Does not sleep well without meds,  8. RLS (restless legs syndrome) Takes mirapex when she is not able to keep her legs still  9. Severe obesity (BMI >= 40) (HCC) No recent weight changes Wt Readings from Last 3 Encounters:  01/13/22 294 lb (133.4 kg)  10/13/21 298 lb (135.2 kg)  07/03/21 297 lb (134.7 kg)   BMI Readings from Last 3 Encounters:  01/13/22 55.55 kg/m  10/13/21 56.31 kg/m  07/03/21 56.12 kg/m      New complaints: None today  Allergies  Allergen Reactions   Bee Pollen    Butrans [Buprenorphine] Nausea And Vomiting    patch  Coconut (Cocos Nucifera)    Mucinex [Guaifenesin Er] Rash   Outpatient Encounter Medications as of 01/13/2022  Medication Sig   albuterol (VENTOLIN HFA) 108 (90 Base) MCG/ACT inhaler USE 2 INHALATIONS EVERY 6 HOURS AS NEEDED FOR WHEEZING   amLODipine (NORVASC) 5 MG tablet Take 1 tablet (5 mg total) by mouth daily.   baclofen (LIORESAL) 10 MG tablet Take 1 tablet (10 mg total) by mouth 3 (three) times daily.   DULoxetine (CYMBALTA) 60 MG capsule Take 1 capsule (60 mg total) by mouth daily.   EPINEPHrine 0.3 mg/0.3 mL IJ SOAJ injection Inject 0.3 mLs (0.3 mg total) into the muscle once.   gabapentin (NEURONTIN) 300 MG capsule Take 1 capsule (300 mg total) by mouth 3 (three) times  daily.   lisinopril-hydrochlorothiazide (ZESTORETIC) 20-12.5 MG tablet Take 2 tablets by mouth daily.   medroxyPROGESTERone (PROVERA) 10 MG tablet TAKE 1 TABLET DAILY   omeprazole (PRILOSEC) 40 MG capsule Take 1 capsule (40 mg total) by mouth daily.   pantoprazole (PROTONIX) 40 MG tablet Take 1 tablet (40 mg total) by mouth daily.   pramipexole (MIRAPEX) 1 MG tablet Take 1 tablet (1 mg total) by mouth 2 (two) times daily.   rosuvastatin (CRESTOR) 20 MG tablet TAKE 1 TABLET DAILY. REPLACES LIPITOR   topiramate (TOPAMAX) 50 MG tablet Take 1 tablet (50 mg total) by mouth 3 (three) times daily.   TOVIAZ 8 MG TB24 tablet TAKE 1 TABLET DAILY   traMADol (ULTRAM) 50 MG tablet Take 1 tablet (50 mg total) by mouth every 8 (eight) hours as needed.   traMADol (ULTRAM) 50 MG tablet Take 1 tablet (50 mg total) by mouth every 8 (eight) hours as needed.   traMADol (ULTRAM) 50 MG tablet Take 1 tablet (50 mg total) by mouth every 8 (eight) hours as needed.   zolpidem (AMBIEN) 10 MG tablet Take 1 tablet (10 mg total) by mouth at bedtime.   No facility-administered encounter medications on file as of 01/13/2022.    Past Surgical History:  Procedure Laterality Date   BREAST BIOPSY Left    x 2 / 10-12 years ago / benign   BREAST SURGERY     biopsy times 2   JOINT REPLACEMENT Right    JOINT REPLACEMENT Left    SPINE SURGERY     cervical spine    Family History  Problem Relation Age of Onset   Diabetes Mother    Heart attack Father 27   Diabetes Father    Breast cancer Maternal Grandmother    Stroke Maternal Grandmother    Colon cancer Other        family history         Review of Systems  Constitutional:  Negative for diaphoresis.  Eyes:  Negative for pain.  Respiratory:  Negative for shortness of breath.   Cardiovascular:  Negative for chest pain, palpitations and leg swelling.  Gastrointestinal:  Negative for abdominal pain.  Endocrine: Negative for polydipsia.  Skin:  Negative for rash.   Neurological:  Negative for dizziness, weakness and headaches.  Hematological:  Does not bruise/bleed easily.  All other systems reviewed and are negative.      Objective:   Physical Exam Vitals and nursing note reviewed.  Constitutional:      General: She is not in acute distress.    Appearance: Normal appearance. She is well-developed.  HENT:     Head: Normocephalic.     Right Ear: Tympanic membrane normal.     Left Ear:  Tympanic membrane normal.     Nose: Nose normal.     Mouth/Throat:     Mouth: Mucous membranes are moist.  Eyes:     Pupils: Pupils are equal, round, and reactive to light.  Neck:     Vascular: No carotid bruit or JVD.  Cardiovascular:     Rate and Rhythm: Normal rate and regular rhythm.     Heart sounds: Normal heart sounds.  Pulmonary:     Effort: Pulmonary effort is normal. No respiratory distress.     Breath sounds: Normal breath sounds. No wheezing or rales.  Chest:     Chest wall: No tenderness.  Abdominal:     General: Bowel sounds are normal. There is no distension or abdominal bruit.     Palpations: Abdomen is soft. There is no hepatomegaly, splenomegaly, mass or pulsatile mass.     Tenderness: There is no abdominal tenderness.  Musculoskeletal:        General: Normal range of motion.     Cervical back: Normal range of motion and neck supple.  Lymphadenopathy:     Cervical: No cervical adenopathy.  Skin:    General: Skin is warm and dry.  Neurological:     Mental Status: She is alert and oriented to person, place, and time.     Deep Tendon Reflexes: Reflexes are normal and symmetric.  Psychiatric:        Behavior: Behavior normal.        Thought Content: Thought content normal.        Judgment: Judgment normal.    BP 136/82   Pulse 100   Temp 98 F (36.7 C) (Temporal)   Resp 20   Ht 5\' 1"  (1.549 m)   Wt 294 lb (133.4 kg)   LMP 09/13/2012   SpO2 91%   BMI 55.55 kg/m        Assessment & Plan:  Konstantina Nachreiner comes in today  with chief complaint of Medical Management of Chronic Issues   Diagnosis and orders addressed:  1. Primary hypertension Low sodium diet  2. Mixed hyperlipidemia Lfat diet  3. Migraine without aura and without status migrainosus, not intractable Avoid caffeine  4. Gastroesophageal reflux disease without esophagitis Avoid spicy foods Do not eat 2 hours prior to bedtime  5. Fibromyalgia Moist heat rest - traMADol (ULTRAM) 50 MG tablet; Take 1 tablet (50 mg total) by mouth every 8 (eight) hours as needed.  Dispense: 90 tablet; Refill: 0 - traMADol (ULTRAM) 50 MG tablet; Take 1 tablet (50 mg total) by mouth every 8 (eight) hours as needed.  Dispense: 90 tablet; Refill: 0 - traMADol (ULTRAM) 50 MG tablet; Take 1 tablet (50 mg total) by mouth every 8 (eight) hours as needed.  Dispense: 90 tablet; Refill: 0  6. Recurrent major depressive disorder, in full remission [facility]) Stress management  7. Primary insomnia Bedtime routine  8. RLS (restless legs syndrome) Keep legs warm at n ight  9. Severe obesity (BMI >= 40) (HCC) Discussed diet and exercise for person with BMI >25 Will recheck weight in 3-6 months    Labs pending Health Maintenance reviewed Diet and exercise encouraged  Follow up plan: 3 months   [facility], FNP

## 2022-02-13 ENCOUNTER — Other Ambulatory Visit: Payer: Self-pay | Admitting: Nurse Practitioner

## 2022-02-13 DIAGNOSIS — M797 Fibromyalgia: Secondary | ICD-10-CM

## 2022-04-13 ENCOUNTER — Other Ambulatory Visit: Payer: Self-pay | Admitting: Nurse Practitioner

## 2022-04-13 DIAGNOSIS — G2581 Restless legs syndrome: Secondary | ICD-10-CM

## 2022-04-14 ENCOUNTER — Ambulatory Visit: Admitting: Nurse Practitioner

## 2022-04-14 ENCOUNTER — Encounter: Payer: Self-pay | Admitting: Nurse Practitioner

## 2022-04-14 VITALS — BP 135/91 | HR 93 | Temp 98.3°F | Resp 20 | Ht 61.0 in | Wt 293.0 lb

## 2022-04-14 DIAGNOSIS — E782 Mixed hyperlipidemia: Secondary | ICD-10-CM | POA: Diagnosis not present

## 2022-04-14 DIAGNOSIS — G43009 Migraine without aura, not intractable, without status migrainosus: Secondary | ICD-10-CM

## 2022-04-14 DIAGNOSIS — K219 Gastro-esophageal reflux disease without esophagitis: Secondary | ICD-10-CM

## 2022-04-14 DIAGNOSIS — M797 Fibromyalgia: Secondary | ICD-10-CM

## 2022-04-14 DIAGNOSIS — I10 Essential (primary) hypertension: Secondary | ICD-10-CM | POA: Diagnosis not present

## 2022-04-14 DIAGNOSIS — G2581 Restless legs syndrome: Secondary | ICD-10-CM

## 2022-04-14 DIAGNOSIS — R3915 Urgency of urination: Secondary | ICD-10-CM

## 2022-04-14 DIAGNOSIS — F3342 Major depressive disorder, recurrent, in full remission: Secondary | ICD-10-CM | POA: Diagnosis not present

## 2022-04-14 DIAGNOSIS — F5101 Primary insomnia: Secondary | ICD-10-CM

## 2022-04-14 MED ORDER — TRAMADOL HCL 50 MG PO TABS: 50.0000 mg | ORAL_TABLET | Freq: Three times a day (TID) | ORAL | 0 refills | Status: DC | PRN

## 2022-04-14 MED ORDER — OMEPRAZOLE 40 MG PO CPDR: 40.0000 mg | DELAYED_RELEASE_CAPSULE | Freq: Every day | ORAL | 1 refills | Status: DC

## 2022-04-14 MED ORDER — GABAPENTIN 300 MG PO CAPS: 300.0000 mg | ORAL_CAPSULE | Freq: Three times a day (TID) | ORAL | 1 refills | Status: DC

## 2022-04-14 MED ORDER — PRAMIPEXOLE DIHYDROCHLORIDE 1 MG PO TABS: 1.0000 mg | ORAL_TABLET | Freq: Two times a day (BID) | ORAL | 0 refills | Status: DC

## 2022-04-14 MED ORDER — TOPIRAMATE 50 MG PO TABS: 50.0000 mg | ORAL_TABLET | Freq: Three times a day (TID) | ORAL | 1 refills | Status: DC

## 2022-04-14 MED ORDER — LISINOPRIL-HYDROCHLOROTHIAZIDE 20-12.5 MG PO TABS: 2.0000 | ORAL_TABLET | Freq: Every day | ORAL | 1 refills | Status: DC

## 2022-04-14 MED ORDER — ROSUVASTATIN CALCIUM 20 MG PO TABS: ORAL_TABLET | ORAL | 1 refills | Status: DC

## 2022-04-14 MED ORDER — FESOTERODINE FUMARATE ER 8 MG PO TB24: 8.0000 mg | ORAL_TABLET | Freq: Every day | ORAL | 1 refills | Status: DC

## 2022-04-14 MED ORDER — PANTOPRAZOLE SODIUM 40 MG PO TBEC: 40.0000 mg | DELAYED_RELEASE_TABLET | Freq: Every day | ORAL | 1 refills | Status: DC

## 2022-04-14 MED ORDER — AMLODIPINE BESYLATE 5 MG PO TABS: 5.0000 mg | ORAL_TABLET | Freq: Every day | ORAL | 1 refills | Status: DC

## 2022-04-14 MED ORDER — ZOLPIDEM TARTRATE 10 MG PO TABS: 10.0000 mg | ORAL_TABLET | Freq: Every day | ORAL | 5 refills | Status: DC

## 2022-04-14 MED ORDER — DULOXETINE HCL 60 MG PO CPEP: 60.0000 mg | ORAL_CAPSULE | Freq: Every day | ORAL | 1 refills | Status: DC

## 2022-04-14 NOTE — Progress Notes (Signed)
Subjective:    Patient ID: Marie Carter, female    DOB: 08-Feb-1961, 61 y.o.   MRN: [id number]   Chief Complaint: medical management of chronic issues     HPI:  Marie Carter is a 61 y.o. who identifies as a female who was assigned female at birth.   Social history: Lives with: husband Work history: disability   Comes in today for follow up of the following chronic medical issues:  1. Primary hypertension No c/o chest pain, sob or headache. Doe snot check blood pressure at home. BP Readings from Last 3 Encounters:  01/13/22 136/82  10/13/21 (!) 149/93  07/03/21 (!) 142/78     2. Mixed hyperlipidemia Does not watch diet and does no exercise at all. Lab Results  Component Value Date   CHOL 265 (H) 07/03/2021   HDL 61 07/03/2021   LDLCALC 185 (H) 07/03/2021   TRIG 110 07/03/2021   CHOLHDL 4.3 07/03/2021     3. Gastroesophageal reflux disease without esophagitis Is on protonix and also takes omeprazole when needed.  4. Recurrent major depressive disorder, in full remission ([place]) Is on cymbalta daily and is doing well.    04/14/2022    2:25 PM 01/13/2022    2:11 PM 10/13/2021    3:33 PM  Depression screen PHQ 2/9  Decreased Interest 0 0 0  Down, Depressed, Hopeless 1 0 0  PHQ - 2 Score 1 0 0  Altered sleeping 0 0 0  Tired, decreased energy 1 0 0  Change in appetite 0 0 0  Feeling bad or failure about yourself  0 0 1  Trouble concentrating 0 0 0  Moving slowly or fidgety/restless 0 0 0  Suicidal thoughts 0 0 0  PHQ-9 Score 2 0 1  Difficult doing work/chores Not difficult at all Not difficult at all Not difficult at all      04/14/2022    2:26 PM 01/13/2022    2:11 PM 10/13/2021    3:33 PM 07/03/2021    2:40 PM  GAD 7 : Generalized Anxiety Score  Nervous, Anxious, on Edge 0 0 0 0  Control/stop worrying 0 0 1 1  Worry too much - different things 0 0 0 0  Trouble relaxing 0 0 0 1  Restless 0 0 0 0  Easily annoyed or irritable 0 0 0 2  Afraid - awful  might happen 0 0 1 0  Total GAD 7 Score 0 0 2 4  Anxiety Difficulty Not difficult at all Not difficult at all Not difficult at all Not difficult at all     5. Primary insomnia Is on ambien to sleep at night. Sleeps about 7-8hours a night  6. Fibromyalgia Pain assessment: Cause of pain- fibromyalgia Pain location- pain varies in location from day to day Pain on scale of 1-10- 7-8/10 Frequency- daily What increases pain-to much activity What makes pain Better-rest helps her Effects on ADL - none Any change in general medical condition-none  Current opioids rx- ultram 92m TID # meds rx- 90 Effectiveness of current meds-helps Adverse reactions from pain meds-none Morphine equivalent- 15 MME  Pill count performed-No Last drug screen - 07/11/20 ( high risk [id number] risk [id number] risk yearly ) Urine drug screen today- Yes [place] [facility]  If yes were their any concerning findings? - no   Overdose risk: 1    Pain contract signed on 04/17/21   7. RLS (restless legs syndrome) Is on mirapex and neurontin.  Helps   8. Severe obesity (BMI >= 40) (HCC) No recent weight changes Wt Readings from Last 3 Encounters:  04/14/22 293 lb (132.9 kg)  01/13/22 294 lb (133.4 kg)  10/13/21 298 lb (135.2 kg)   BMI Readings from Last 3 Encounters:  04/14/22 55.36 kg/m  01/13/22 55.55 kg/m  10/13/21 56.31 kg/m     New complaints: None today  Allergies  Allergen Reactions   Bee Pollen    Butrans [Buprenorphine] Nausea And Vomiting    patch   Coconut (Cocos Nucifera)    Mucinex [Guaifenesin Er] Rash   Outpatient Encounter Medications as of 04/14/2022  Medication Sig   albuterol (VENTOLIN HFA) 108 (90 Base) MCG/ACT inhaler USE 2 INHALATIONS EVERY 6 HOURS AS NEEDED FOR WHEEZING   amLODipine (NORVASC) 5 MG tablet Take 1 tablet (5 mg total) by mouth daily.   baclofen (LIORESAL) 10 MG tablet Take 1 tablet (10 mg total) by mouth 3 (three) times daily.    DULoxetine (CYMBALTA) 60 MG capsule Take 1 capsule (60 mg total) by mouth daily.   EPINEPHrine 0.3 mg/0.3 mL IJ SOAJ injection Inject 0.3 mLs (0.3 mg total) into the muscle once.   gabapentin (NEURONTIN) 300 MG capsule Take 1 capsule (300 mg total) by mouth 3 (three) times daily.   lisinopril-hydrochlorothiazide (ZESTORETIC) 20-12.5 MG tablet Take 2 tablets by mouth daily.   medroxyPROGESTERone (PROVERA) 10 MG tablet TAKE 1 TABLET DAILY   omeprazole (PRILOSEC) 40 MG capsule Take 1 capsule (40 mg total) by mouth daily.   pantoprazole (PROTONIX) 40 MG tablet Take 1 tablet (40 mg total) by mouth daily.   pramipexole (MIRAPEX) 1 MG tablet TAKE 1 TABLET TWICE A DAY   rosuvastatin (CRESTOR) 20 MG tablet TAKE 1 TABLET DAILY. REPLACES LIPITOR   topiramate (TOPAMAX) 50 MG tablet Take 1 tablet (50 mg total) by mouth 3 (three) times daily.   TOVIAZ 8 MG TB24 tablet TAKE 1 TABLET DAILY   traMADol (ULTRAM) 50 MG tablet Take 1 tablet (50 mg total) by mouth every 8 (eight) hours as needed.   traMADol (ULTRAM) 50 MG tablet Take 1 tablet (50 mg total) by mouth every 8 (eight) hours as needed.   traMADol (ULTRAM) 50 MG tablet Take 1 tablet (50 mg total) by mouth every 8 (eight) hours as needed.   zolpidem (AMBIEN) 10 MG tablet Take 1 tablet (10 mg total) by mouth at bedtime.   No facility-administered encounter medications on file as of 04/14/2022.    Past Surgical History:  Procedure Laterality Date   BREAST BIOPSY Left    x 2 / 10-12 years ago / benign   BREAST SURGERY     biopsy times 2   JOINT REPLACEMENT Right    JOINT REPLACEMENT Left    SPINE SURGERY     cervical spine    Family History  Problem Relation Age of Onset   Diabetes Mother    Heart attack Father 24   Diabetes Father    Breast cancer Maternal Grandmother    Stroke Maternal Grandmother    Colon cancer Other        family history       Controlled substance contract: 04/14/22     Review of Systems  Constitutional:   Negative for diaphoresis.  Eyes:  Negative for pain.  Respiratory:  Negative for shortness of breath.   Cardiovascular:  Negative for chest pain, palpitations and leg swelling.  Gastrointestinal:  Negative for abdominal pain.  Endocrine: Negative for polydipsia.  Skin:  Negative for rash.  Neurological:  Negative for dizziness, weakness and headaches.  Hematological:  Does not bruise/bleed easily.  All other systems reviewed and are negative.      Objective:   Physical Exam Vitals and nursing note reviewed.  Constitutional:      General: She is not in acute distress.    Appearance: Normal appearance. She is well-developed.  HENT:     Head: Normocephalic.     Right Ear: Tympanic membrane normal.     Left Ear: Tympanic membrane normal.     Nose: Nose normal.     Mouth/Throat:     Mouth: Mucous membranes are moist.  Eyes:     Pupils: Pupils are equal, round, and reactive to light.  Neck:     Vascular: No carotid bruit or JVD.  Cardiovascular:     Rate and Rhythm: Normal rate and regular rhythm.     Heart sounds: Normal heart sounds.  Pulmonary:     Effort: Pulmonary effort is normal. No respiratory distress.     Breath sounds: Normal breath sounds. No wheezing or rales.  Chest:     Chest wall: No tenderness.  Abdominal:     General: Bowel sounds are normal. There is no distension or abdominal bruit.     Palpations: Abdomen is soft. There is no hepatomegaly, splenomegaly, mass or pulsatile mass.     Tenderness: There is no abdominal tenderness.  Musculoskeletal:        General: Normal range of motion.     Cervical back: Normal range of motion and neck supple.  Lymphadenopathy:     Cervical: No cervical adenopathy.  Skin:    General: Skin is warm and dry.  Neurological:     Mental Status: She is alert and oriented to person, place, and time.     Deep Tendon Reflexes: Reflexes are normal and symmetric.  Psychiatric:        Behavior: Behavior normal.        Thought  Content: Thought content normal.        Judgment: Judgment normal.    BP (!) 135/91   Pulse 93   Temp 98.3 F (36.8 C) (Temporal)   Resp 20   Ht 5' 1" (1.549 m)   Wt 293 lb (132.9 kg)   LMP 09/13/2012   SpO2 94%   BMI 55.36 kg/m         Assessment & Plan:  Marie Carter comes in today with chief complaint of Medical Management of Chronic Issues   Diagnosis and orders addressed:  1. Primary hypertension Low sodium - amLODipine (NORVASC) 5 MG tablet; Take 1 tablet (5 mg total) by mouth daily.  Dispense: 90 tablet; Refill: 1 - lisinopril-hydrochlorothiazide (ZESTORETIC) 20-12.5 MG tablet; Take 2 tablets by mouth daily.  Dispense: 180 tablet; Refill: 1 - CBC with Differential/Platelet - CMP14+EGFR  2. Mixed hyperlipidemia Low fat diet - rosuvastatin (CRESTOR) 20 MG tablet; TAKE 1 TABLET DAILY. REPLACES LIPITOR  Dispense: 90 tablet; Refill: 1 - Lipid panel  3. Gastroesophageal reflux disease without esophagitis Avoid spicy foods Do not eat 2 hours prior to bedtime  - omeprazole (PRILOSEC) 40 MG capsule; Take 1 capsule (40 mg total) by mouth daily.  Dispense: 90 capsule; Refill: 1 - pantoprazole (PROTONIX) 40 MG tablet; Take 1 tablet (40 mg total) by mouth daily.  Dispense: 90 tablet; Refill: 1  4. Recurrent major depressive disorder, in full remission ([place]) Stress management  5. Primary insomnia Bedtime routine - zolpidem (AMBIEN) 10  MG tablet; Take 1 tablet (10 mg total) by mouth at bedtime.  Dispense: 30 tablet; Refill: 5  6. Fibromyalgia Exercise encouraged - traMADol (ULTRAM) 50 MG tablet; Take 1 tablet (50 mg total) by mouth every 8 (eight) hours as needed.  Dispense: 90 tablet; Refill: 0 - ToxASSURE Select 13 (MW), Urine - traMADol (ULTRAM) 50 MG tablet; Take 1 tablet (50 mg total) by mouth every 8 (eight) hours as needed.  Dispense: 90 tablet; Refill: 0 - traMADol (ULTRAM) 50 MG tablet; Take 1 tablet (50 mg total) by mouth every 8 (eight) hours as needed.   Dispense: 90 tablet; Refill: 0 - DULoxetine (CYMBALTA) 60 MG capsule; Take 1 capsule (60 mg total) by mouth daily.  Dispense: 90 capsule; Refill: 1 - gabapentin (NEURONTIN) 300 MG capsule; Take 1 capsule (300 mg total) by mouth 3 (three) times daily.  Dispense: 540 capsule; Refill: 1  7. RLS (restless legs syndrome) Keep legs warm at night - pramipexole (MIRAPEX) 1 MG tablet; Take 1 tablet (1 mg total) by mouth 2 (two) times daily.  Dispense: 180 tablet; Refill: 0  8. Severe obesity (BMI >= 40) (HCC) Discussed diet and exercise for person with BMI >25 Will recheck weight in 3-6 months   9. Migraine without aura and without status migrainosus, not intractable - topiramate (TOPAMAX) 50 MG tablet; Take 1 tablet (50 mg total) by mouth 3 (three) times daily.  Dispense: 270 tablet; Refill: 1  10. Urinary urgency - fesoterodine (TOVIAZ) 8 MG TB24 tablet; Take 1 tablet (8 mg total) by mouth daily.  Dispense: 90 tablet; Refill: 1   Labs pending Health Maintenance reviewed Diet and exercise encouraged  Follow up plan: 3 months   Mary-Margaret Hassell Done, FNP

## 2022-04-14 NOTE — Patient Instructions (Signed)
Restless Legs Syndrome Restless legs syndrome is a condition that causes uncomfortable feelings or sensations in the legs, especially while sitting or lying down. The sensations usually cause an overwhelming urge to move the legs. The arms can also sometimes be affected. The condition can range from mild to severe. The symptoms often interfere with a person's ability to sleep. What are the causes? The cause of this condition is not known. What increases the risk? The following factors may make you more likely to develop this condition: Being older than 50. Pregnancy. Being a woman. In general, the condition is more common in women than in men. A family history of the condition. Having iron deficiency. Overuse of caffeine, nicotine, or alcohol. Certain medical conditions, such as kidney disease, Parkinson's disease, or nerve damage. Certain medicines, such as those for high blood pressure, nausea, colds, allergies, depression, and some heart conditions. What are the signs or symptoms? The main symptom of this condition is uncomfortable sensations in the legs, such as: Pulling. Tingling. Prickling. Throbbing. Crawling. Burning. Usually, the sensations: Affect both sides of the body. Are worse when you sit or lie down. Are worse at night. These may make it difficult to fall asleep. Make you have a strong urge to move your legs. Are temporarily relieved by moving your legs or standing. The arms can also be affected, but this is rare. People who have this condition often have tiredness during the day because of their lack of sleep at night. How is this diagnosed? This condition may be diagnosed based on: Your symptoms. Blood tests. In some cases, you may be monitored in a sleep lab by a specialist (a sleep study). This can detect any disruptions in your sleep. How is this treated? This condition is treated by managing the symptoms. This may include: Lifestyle changes, such as  exercising, using relaxation techniques, and avoiding caffeine, alcohol, or tobacco. Iron supplements. Medicines. Parkinson's medications may be tried first. Anti-seizure medications can also be helpful. Follow these instructions at home: General instructions Take over-the-counter and prescription medicines only as told by your health care provider. Use methods to help relieve the uncomfortable sensations, such as: Massaging your legs. Walking or stretching. Taking a cold or hot bath. Keep all follow-up visits. This is important. Lifestyle     Practice good sleep habits. For example, go to bed and get up at the same time every day. Most adults should get 7-9 hours of sleep each night. Exercise regularly. Try to get at least 30 minutes of exercise most days of the week. Practice ways of relaxing, such as yoga or meditation. Avoid caffeine and alcohol. Do not use any products that contain nicotine or tobacco. These products include cigarettes, chewing tobacco, and vaping devices, such as e-cigarettes. If you need help quitting, ask your health care provider. Where to find more information National Institute of Neurological Disorders and Stroke: www.ninds.nih.gov Contact a health care provider if: Your symptoms get worse or they do not improve with treatment. Summary Restless legs syndrome is a condition that causes uncomfortable feelings or sensations in the legs, especially while sitting or lying down. The symptoms often interfere with your ability to sleep. This condition is treated by managing the symptoms. You may need to make lifestyle changes or take medicines. This information is not intended to replace advice given to you by your health care provider. Make sure you discuss any questions you have with your health care provider. Document Revised: 12/29/2020 Document Reviewed: 12/29/2020 Elsevier Patient Education    2023 Elsevier Inc.  

## 2022-04-14 NOTE — Addendum Note (Signed)
Addended by: Cleda Daub on: 04/14/2022 03:02 PM   Modules accepted: Orders

## 2022-04-16 LAB — CBC WITH DIFFERENTIAL/PLATELET
Basophils Absolute: 0.1 10*3/uL (ref 0.0–0.2)
Basos: 1 %
EOS (ABSOLUTE): 0.1 10*3/uL (ref 0.0–0.4)
Eos: 1 %
Hematocrit: 45.9 % (ref 34.0–46.6)
Hemoglobin: 14.8 g/dL (ref 11.1–15.9)
Immature Grans (Abs): 0 10*3/uL (ref 0.0–0.1)
Immature Granulocytes: 0 %
Lymphocytes Absolute: 2.5 10*3/uL (ref 0.7–3.1)
Lymphs: 41 %
MCH: 29.7 pg (ref 26.6–33.0)
MCHC: 32.2 g/dL (ref 31.5–35.7)
MCV: 92 fL (ref 79–97)
Monocytes Absolute: 0.3 10*3/uL (ref 0.1–0.9)
Monocytes: 6 %
Neutrophils Absolute: 3.2 10*3/uL (ref 1.4–7.0)
Neutrophils: 51 %
Platelets: 376 10*3/uL (ref 150–450)
RBC: 4.98 x10E6/uL (ref 3.77–5.28)
RDW: 12.9 % (ref 11.7–15.4)
WBC: 6.2 10*3/uL (ref 3.4–10.8)

## 2022-04-16 LAB — DRUG SCREEN 10 W/CONF, SERUM
Amphetamines, IA: NEGATIVE ng/mL
Barbiturates, IA: NEGATIVE ug/mL
Benzodiazepines, IA: NEGATIVE ng/mL
Cocaine & Metabolite, IA: NEGATIVE ng/mL
Methadone, IA: NEGATIVE ng/mL
Opiates, IA: NEGATIVE ng/mL
Oxycodones, IA: NEGATIVE ng/mL
Phencyclidine, IA: NEGATIVE ng/mL
Propoxyphene, IA: NEGATIVE ng/mL
THC(Marijuana) Metabolite, IA: NEGATIVE ng/mL

## 2022-04-16 LAB — LIPID PANEL
Chol/HDL Ratio: 4 ratio (ref 0.0–4.4)
Cholesterol, Total: 234 mg/dL — ABNORMAL HIGH (ref 100–199)
HDL: 58 mg/dL (ref >39)
LDL Chol Calc (NIH): 157 mg/dL — ABNORMAL HIGH (ref 0–99)
Triglycerides: 105 mg/dL (ref 0–149)
VLDL Cholesterol Cal: 19 mg/dL (ref 5–40)

## 2022-04-16 LAB — CMP14+EGFR
ALT: 31 IU/L (ref 0–32)
AST: 17 IU/L (ref 0–40)
Albumin/Globulin Ratio: 2 (ref 1.2–2.2)
Albumin: 4.8 g/dL (ref 3.9–4.9)
Alkaline Phosphatase: 115 IU/L (ref 44–121)
BUN/Creatinine Ratio: 14 (ref 12–28)
BUN: 14 mg/dL (ref 8–27)
Bilirubin Total: 0.4 mg/dL (ref 0.0–1.2)
CO2: 22 mmol/L (ref 20–29)
Calcium: 8.4 mg/dL — ABNORMAL LOW (ref 8.7–10.3)
Chloride: 102 mmol/L (ref 96–106)
Creatinine, Ser: 0.98 mg/dL (ref 0.57–1.00)
Globulin, Total: 2.4 g/dL (ref 1.5–4.5)
Glucose: 100 mg/dL — ABNORMAL HIGH (ref 70–99)
Potassium: 3.9 mmol/L (ref 3.5–5.2)
Sodium: 142 mmol/L (ref 134–144)
Total Protein: 7.2 g/dL (ref 6.0–8.5)
eGFR: 65 mL/min/{1.73_m2} (ref >59)

## 2022-04-21 ENCOUNTER — Other Ambulatory Visit: Payer: Self-pay | Admitting: Nurse Practitioner

## 2022-04-21 DIAGNOSIS — F5101 Primary insomnia: Secondary | ICD-10-CM

## 2022-05-22 ENCOUNTER — Other Ambulatory Visit: Payer: Self-pay | Admitting: Nurse Practitioner

## 2022-05-22 DIAGNOSIS — M797 Fibromyalgia: Secondary | ICD-10-CM

## 2022-06-15 ENCOUNTER — Telehealth: Admitting: Nurse Practitioner

## 2022-06-15 ENCOUNTER — Encounter: Payer: Self-pay | Admitting: Nurse Practitioner

## 2022-06-15 DIAGNOSIS — J4521 Mild intermittent asthma with (acute) exacerbation: Secondary | ICD-10-CM

## 2022-06-15 MED ORDER — PROMETHAZINE-DM 6.25-15 MG/5ML PO SYRP: 5.0000 mL | ORAL_SOLUTION | Freq: Four times a day (QID) | ORAL | 0 refills | Status: DC | PRN

## 2022-06-15 MED ORDER — AZITHROMYCIN 250 MG PO TABS: ORAL_TABLET | ORAL | 0 refills | Status: DC

## 2022-06-15 MED ORDER — PREDNISONE 20 MG PO TABS: 40.0000 mg | ORAL_TABLET | Freq: Every day | ORAL | 0 refills | Status: AC

## 2022-06-15 NOTE — Patient Instructions (Signed)
1. Take meds as prescribed 2. Use a cool mist humidifier especially during the winter months and when heat has been humid. 3. Use saline nose sprays frequently 4. Saline irrigations of the nose can be very helpful if done frequently.  * 4X daily for 1 week*  * Use of a nettie pot can be helpful with this. Follow directions with this* 5. Drink plenty of fluids 6. Keep thermostat turn down low 7.For any cough or congestion- promethazine dm 8. For fever or aces or pains- take tylenol or ibuprofen appropriate for age and weight.  * for fevers greater than 101 orally you may alternate ibuprofen and tylenol every  3 hours.    

## 2022-06-15 NOTE — Progress Notes (Signed)
Virtual Visit Consent   [name], you are scheduled for a virtual visit with Mary-Margaret Hassell Done, [place], a [facility] provider, today.     Just as with appointments in the office, your consent must be obtained to participate.  Your consent will be active for this visit and any virtual visit you may have with one of our providers in the next 365 days.     If you have a MyChart account, a copy of this consent can be sent to you electronically.  All virtual visits are billed to your insurance company just like a traditional visit in the office.    As this is a virtual visit, video technology does not allow for your provider to perform a traditional examination.  This may limit your provider's ability to fully assess your condition.  If your provider identifies any concerns that need to be evaluated in person or the need to arrange testing (such as labs, EKG, etc.), we will make arrangements to do so.     Although advances in technology are sophisticated, we cannot ensure that it will always work on either your end or our end.  If the connection with a video visit is poor, the visit may have to be switched to a telephone visit.  With either a video or telephone visit, we are not always able to ensure that we have a secure connection.     I need to obtain your verbal consent now.   Are you willing to proceed with your visit today? YES   Marie Carter has provided verbal consent on 06/15/2022 for a virtual visit (video or telephone).   Mary-Margaret Hassell Done, FNP   Date: 06/15/2022 9:52 AM   Virtual Visit via Video Note   I, Mary-Margaret Hassell Done, connected with Marie Carter ([id number], Jun 02, 1960) on 06/15/22 at  5:15 PM EST by a video-enabled telemedicine application and verified that I am speaking with the correct person using two identifiers.  Location: Patient: Virtual Visit Location Patient: Home Provider: Virtual Visit Location Provider: Mobile   I discussed the limitations of  evaluation and management by telemedicine and the availability of in person appointments. The patient expressed understanding and agreed to proceed.    History of Present Illness: Marie Carter is a 62 y.o. who identifies as a female who was assigned female at birth, and is being seen today for cough.  HPI: Cough This is a new problem. The current episode started 1 to 4 weeks ago. The problem has been waxing and waning. The problem occurs every few minutes. The cough is Productive of sputum. Associated symptoms include rhinorrhea. Pertinent negatives include no ear congestion or ear pain. Nothing aggravates the symptoms. Treatments tried: cold and flu meds. The treatment provided mild relief. Her past medical history is significant for asthma. There is no history of COPD.    Review of Systems  HENT:  Positive for rhinorrhea. Negative for ear pain.   Respiratory:  Positive for cough.     Problems:  Patient Active Problem List   Diagnosis Date Noted   Migraine without aura and without status migrainosus, not intractable 11/05/2014   Severe obesity (BMI >= 40) (HCC) 07/23/2014   RLS (restless legs syndrome) 01/15/2014   GERD (gastroesophageal reflux disease) 10/05/2012   Hyperlipidemia 10/05/2012   Fibromyalgia 10/05/2012   Depression 10/05/2012   Asthma, chronic 10/05/2012   Hypertension 10/05/2012   Insomnia 10/05/2012    Allergies:  Allergies  Allergen Reactions   Bee Pollen  Butrans [Buprenorphine] Nausea And Vomiting    patch   Coconut (Cocos Nucifera)    Mucinex [Guaifenesin Er] Rash   Medications:  Current Outpatient Medications:    albuterol (VENTOLIN HFA) 108 (90 Base) MCG/ACT inhaler, USE 2 INHALATIONS EVERY 6 HOURS AS NEEDED FOR WHEEZING, Disp: 51 g, Rfl: 3   amLODipine (NORVASC) 5 MG tablet, Take 1 tablet (5 mg total) by mouth daily., Disp: 90 tablet, Rfl: 1   baclofen (LIORESAL) 10 MG tablet, Take 1 tablet (10 mg total) by mouth 3 (three) times daily., Disp: 90  tablet, Rfl: 11   DULoxetine (CYMBALTA) 60 MG capsule, Take 1 capsule (60 mg total) by mouth daily., Disp: 90 capsule, Rfl: 1   EPINEPHrine 0.3 mg/0.3 mL IJ SOAJ injection, Inject 0.3 mLs (0.3 mg total) into the muscle once., Disp: 2 Device, Rfl: 2   fesoterodine (TOVIAZ) 8 MG TB24 tablet, Take 1 tablet (8 mg total) by mouth daily., Disp: 90 tablet, Rfl: 1   gabapentin (NEURONTIN) 300 MG capsule, Take 1 capsule (300 mg total) by mouth 3 (three) times daily., Disp: 540 capsule, Rfl: 1   lisinopril-hydrochlorothiazide (ZESTORETIC) 20-12.5 MG tablet, Take 2 tablets by mouth daily., Disp: 180 tablet, Rfl: 1   medroxyPROGESTERone (PROVERA) 10 MG tablet, TAKE 1 TABLET DAILY, Disp: 90 tablet, Rfl: 0   omeprazole (PRILOSEC) 40 MG capsule, Take 1 capsule (40 mg total) by mouth daily., Disp: 90 capsule, Rfl: 1   pantoprazole (PROTONIX) 40 MG tablet, Take 1 tablet (40 mg total) by mouth daily., Disp: 90 tablet, Rfl: 1   pramipexole (MIRAPEX) 1 MG tablet, Take 1 tablet (1 mg total) by mouth 2 (two) times daily., Disp: 180 tablet, Rfl: 0   rosuvastatin (CRESTOR) 20 MG tablet, TAKE 1 TABLET DAILY. REPLACES LIPITOR, Disp: 90 tablet, Rfl: 1   topiramate (TOPAMAX) 50 MG tablet, Take 1 tablet (50 mg total) by mouth 3 (three) times daily., Disp: 270 tablet, Rfl: 1   traMADol (ULTRAM) 50 MG tablet, Take 1 tablet (50 mg total) by mouth every 8 (eight) hours as needed., Disp: 90 tablet, Rfl: 0   traMADol (ULTRAM) 50 MG tablet, Take 1 tablet (50 mg total) by mouth every 8 (eight) hours as needed., Disp: 90 tablet, Rfl: 0   traMADol (ULTRAM) 50 MG tablet, Take 1 tablet (50 mg total) by mouth every 8 (eight) hours as needed., Disp: 90 tablet, Rfl: 0   zolpidem (AMBIEN) 10 MG tablet, Take 1 tablet (10 mg total) by mouth at bedtime., Disp: 30 tablet, Rfl: 5  Observations/Objective: Patient is well-developed, well-nourished in no acute distress.  Resting comfortably  at home.  Head is normocephalic, atraumatic.  No labored  breathing.  Speech is clear and coherent with logical content.  Patient is alert and oriented at baseline.  Raspy voice Deep tight cough  Assessment and Plan:  Marie Carter in today with chief complaint of Cough   1. Mild intermittent asthmatic bronchitis with acute exacerbation 1. Take meds as prescribed 2. Use a cool mist humidifier especially during the winter months and when heat has been humid. 3. Use saline nose sprays frequently 4. Saline irrigations of the nose can be very helpful if done frequently.  * 4X daily for 1 week*  * Use of a nettie pot can be helpful with this. Follow directions with this* 5. Drink plenty of fluids 6. Keep thermostat turn down low 7.For any cough or congestion- promethazine DM 8. For fever or aces or pains- take tylenol or ibuprofen appropriate  for age and weight.  * for fevers greater than 101 orally you may alternate ibuprofen and tylenol every  3 hours.    - azithromycin (ZITHROMAX Z-PAK) 250 MG tablet; As directed  Dispense: 6 tablet; Refill: 0 - promethazine-dextromethorphan (PROMETHAZINE-DM) 6.25-15 MG/5ML syrup; Take 5 mLs by mouth 4 (four) times daily as needed.  Dispense: 118 mL; Refill: 0 - predniSONE (DELTASONE) 20 MG tablet; Take 2 tablets (40 mg total) by mouth daily with breakfast for 5 days. 2 po daily for 5 days  Dispense: 10 tablet; Refill: 0   Follow Up Instructions: I discussed the assessment and treatment plan with the patient. The patient was provided an opportunity to ask questions and all were answered. The patient agreed with the plan and demonstrated an understanding of the instructions.  A copy of instructions were sent to the patient via MyChart.  The patient was advised to call back or seek an in-person evaluation if the symptoms worsen or if the condition fails to improve as anticipated.  Time:  I spent 6 minutes with the patient via telehealth technology discussing the above problems/concerns.    Mary-Margaret  Daphine Deutscher, FNP

## 2022-07-10 ENCOUNTER — Other Ambulatory Visit: Payer: Self-pay | Admitting: Nurse Practitioner

## 2022-07-10 DIAGNOSIS — G2581 Restless legs syndrome: Secondary | ICD-10-CM

## 2022-07-14 ENCOUNTER — Other Ambulatory Visit: Payer: Self-pay | Admitting: Nurse Practitioner

## 2022-07-16 ENCOUNTER — Ambulatory Visit: Admitting: Nurse Practitioner

## 2022-07-16 NOTE — Progress Notes (Deleted)
Subjective:    Patient ID: Marie Carter, female    DOB: 08-28-1960, 62 y.o.   MRN: [id number]   Chief Complaint: .med   HPI:  Marie Carter is a 62 y.o. who identifies as a female who was assigned female at birth.   Social history: Lives with: husband Work history: disability   Comes in today for follow up of the following chronic medical issues:  1. Primary hypertension No c/o chest pain, sob or headache. Does not check blood pressure at home. BP Readings from Last 3 Encounters:  04/14/22 (!) 135/91  01/13/22 136/82  10/13/21 (!) 149/93     2. Mixed hyperlipidemia Does not watch diet and does no dedicated exercise. Lab Results  Component Value Date   CHOL 234 (H) 04/14/2022   HDL 58 04/14/2022   LDLCALC 157 (H) 04/14/2022   TRIG 105 04/14/2022   CHOLHDL 4.0 04/14/2022     3. Gastroesophageal reflux disease without esophagitis Is on omeprazole daily and that helps with her symptoms.  4. Migraine without aura and without status migrainosus, not intractable Is on topamax. Has 1 migraine a month usually  5. Fibromyalgia Is on ultram daily. Also on cymbalta and neurontin  Pain assessment: Cause of pain- fibromyalgia Pain location- varies from day to day Pain on scale of 1-10- 7/10 Frequency- daily What increases pain-to much activity What makes pain Better-rest helps her Effects on ADL - none Any change in general medical condition-none  Current opioids rx- ultram # meds rx- 60 Effectiveness of current meds-helps Adverse reactions from pain meds-none Morphine equivalent- 10MME  Pill count performed-No Last drug screen - 07/11/20- she could not void at last visit ( high risk q32m moderate risk q687mlow risk yearly ) Urine drug screen today- Yes [place]- yes  If yes were their any concerning findings? - no   Overdose risk: 1    Pain contract signed on:   6. RLS (restless legs syndrome) Takes mirapex nightly- helps keep legs calm  at night  7. Recurrent major depressive disorder, in full remission ([place] currently on no antidepressants ***  8. Primary insomnia Is on ambien to sleep- sleeps about 7-8 ours a night  9. Severe obesity (BMI >= 40) (HCC) No recent weight gains   New complaints: None  today  Allergies  Allergen Reactions   Bee Pollen    Butrans [Buprenorphine] Nausea And Vomiting    patch   Coconut (Cocos Nucifera)    Mucinex [Guaifenesin Er] Rash   Outpatient Encounter Medications as of 07/16/2022  Medication Sig   albuterol (VENTOLIN HFA) 108 (90 Base) MCG/ACT inhaler USE 2 INHALATIONS EVERY 6 HOURS AS NEEDED FOR WHEEZING   amLODipine (NORVASC) 5 MG tablet Take 1 tablet (5 mg total) by mouth daily.   azithromycin (ZITHROMAX Z-PAK) 250 MG tablet As directed   baclofen (LIORESAL) 10 MG tablet Take 1 tablet (10 mg total) by mouth 3 (three) times daily.   DULoxetine (CYMBALTA) 60 MG capsule Take 1 capsule (60 mg total) by mouth daily.   EPINEPHrine 0.3 mg/0.3 mL IJ SOAJ injection Inject 0.3 mLs (0.3 mg total) into the muscle once.   fesoterodine (TOVIAZ) 8 MG TB24 tablet Take 1 tablet (8 mg total) by mouth daily.   gabapentin (NEURONTIN) 300 MG capsule Take 1 capsule (300 mg total) by mouth 3 (three) times daily.   lisinopril-hydrochlorothiazide (ZESTORETIC) 20-12.5 MG tablet Take 2 tablets by mouth daily.   medroxyPROGESTERone (PROVERA) 10 MG tablet TAKE 1  TABLET DAILY   omeprazole (PRILOSEC) 40 MG capsule Take 1 capsule (40 mg total) by mouth daily.   pantoprazole (PROTONIX) 40 MG tablet Take 1 tablet (40 mg total) by mouth daily.   pramipexole (MIRAPEX) 1 MG tablet TAKE 1 TABLET TWICE A DAY   promethazine-dextromethorphan (PROMETHAZINE-DM) 6.25-15 MG/5ML syrup Take 5 mLs by mouth 4 (four) times daily as needed.   rosuvastatin (CRESTOR) 20 MG tablet TAKE 1 TABLET DAILY. REPLACES LIPITOR   topiramate (TOPAMAX) 50 MG tablet Take 1 tablet (50 mg total) by mouth 3 (three) times daily.    traMADol (ULTRAM) 50 MG tablet Take 1 tablet (50 mg total) by mouth every 8 (eight) hours as needed.   traMADol (ULTRAM) 50 MG tablet Take 1 tablet (50 mg total) by mouth every 8 (eight) hours as needed.   traMADol (ULTRAM) 50 MG tablet Take 1 tablet (50 mg total) by mouth every 8 (eight) hours as needed.   zolpidem (AMBIEN) 10 MG tablet Take 1 tablet (10 mg total) by mouth at bedtime.   No facility-administered encounter medications on file as of 07/16/2022.    Past Surgical History:  Procedure Laterality Date   BREAST BIOPSY Left    x 2 / 10-12 years ago / benign   BREAST SURGERY     biopsy times 2   JOINT REPLACEMENT Right    JOINT REPLACEMENT Left    SPINE SURGERY     cervical spine    Family History  Problem Relation Age of Onset   Diabetes Mother    Heart attack Father 15   Diabetes Father    Breast cancer Maternal Grandmother    Stroke Maternal Grandmother    Colon cancer Other        family history       Controlled substance contract: ***     Review of Systems     Objective:   Physical Exam        Assessment & Plan:

## 2022-07-17 ENCOUNTER — Other Ambulatory Visit: Payer: Self-pay | Admitting: Nurse Practitioner

## 2022-07-17 ENCOUNTER — Encounter: Payer: Self-pay | Admitting: Nurse Practitioner

## 2022-07-17 DIAGNOSIS — M797 Fibromyalgia: Secondary | ICD-10-CM

## 2022-07-17 NOTE — Telephone Encounter (Signed)
Pt says she has a refill left at the pharmacy but they can't fill it because they say it is expired. Wants to know if MMM can send refill in? Pt only takes this medicine PRN.

## 2022-07-24 ENCOUNTER — Ambulatory Visit: Admitting: Nurse Practitioner

## 2022-07-24 ENCOUNTER — Encounter: Payer: Self-pay | Admitting: Nurse Practitioner

## 2022-07-24 VITALS — BP 132/78 | HR 86 | Temp 97.9°F | Resp 20 | Ht 61.0 in | Wt 292.0 lb

## 2022-07-24 DIAGNOSIS — Z0001 Encounter for general adult medical examination with abnormal findings: Secondary | ICD-10-CM

## 2022-07-24 DIAGNOSIS — G43009 Migraine without aura, not intractable, without status migrainosus: Secondary | ICD-10-CM | POA: Diagnosis not present

## 2022-07-24 DIAGNOSIS — I10 Essential (primary) hypertension: Secondary | ICD-10-CM

## 2022-07-24 DIAGNOSIS — K219 Gastro-esophageal reflux disease without esophagitis: Secondary | ICD-10-CM

## 2022-07-24 DIAGNOSIS — Z0000 Encounter for general adult medical examination without abnormal findings: Secondary | ICD-10-CM

## 2022-07-24 DIAGNOSIS — F5101 Primary insomnia: Secondary | ICD-10-CM

## 2022-07-24 DIAGNOSIS — J4520 Mild intermittent asthma, uncomplicated: Secondary | ICD-10-CM

## 2022-07-24 DIAGNOSIS — E782 Mixed hyperlipidemia: Secondary | ICD-10-CM

## 2022-07-24 DIAGNOSIS — G2581 Restless legs syndrome: Secondary | ICD-10-CM

## 2022-07-24 DIAGNOSIS — M797 Fibromyalgia: Secondary | ICD-10-CM

## 2022-07-24 DIAGNOSIS — F3342 Major depressive disorder, recurrent, in full remission: Secondary | ICD-10-CM

## 2022-07-24 MED ORDER — TRAMADOL HCL 50 MG PO TABS: 50.0000 mg | ORAL_TABLET | Freq: Three times a day (TID) | ORAL | 0 refills | Status: DC | PRN

## 2022-07-24 MED ORDER — ZOLPIDEM TARTRATE 10 MG PO TABS: 10.0000 mg | ORAL_TABLET | Freq: Every day | ORAL | 5 refills | Status: DC

## 2022-07-24 NOTE — Progress Notes (Signed)
Subjective:    Patient ID: Marie Carter, female    DOB: July 11, 1960, 62 y.o.   MRN: [id number]   Chief Complaint: annual physical   HPI:  Marie Carter is a 62 y.o. who identifies as a female who was assigned female at birth.   Social history: Lives with: husband Work history: disabiity   Comes in today for follow up of the following chronic medical issues:  1. Primary hypertension No c/o chest pain, sob or headache. Does not check blood pressure at ome. BP Readings from Last 3 Encounters:  04/14/22 (!) 135/91  01/13/22 136/82  10/13/21 (!) 149/93     2. Migraine without aura and without status migrainosus, not intractable Is on topamax which really helps. As maybe one migraine a month  3. Mild intermittent chronic asthma without complication Usually doing well. Only needs albuterol when she is sick  4. Gastroesophageal reflux disease without esophagitis Is on omperazle and protonix. Does not have symptoms very often.  5. Mixed hyperlipidemia Does not watch diet and does no exercise Lab Results  Component Value Date   CHOL 234 (H) 04/14/2022   HDL 58 04/14/2022   LDLCALC 157 (H) 04/14/2022   TRIG 105 04/14/2022   CHOLHDL 4.0 04/14/2022     6. Recurrent major depressive disorder, in full remission ([place]) Is on cymbalta for her pain but it also helps with her depression.    07/24/2022    2:02 PM 04/14/2022    2:25 PM 01/13/2022    2:11 PM  Depression screen PHQ 2/9  Decreased Interest 0 0 0  Down, Depressed, Hopeless 1 1 0  PHQ - 2 Score 1 1 0  Altered sleeping 0 0 0  Tired, decreased energy 1 1 0  Change in appetite 0 0 0  Feeling bad or failure about yourself  0 0 0  Trouble concentrating 0 0 0  Moving slowly or fidgety/restless 0 0 0  Suicidal thoughts 0 0 0  PHQ-9 Score 2 2 0  Difficult doing work/chores Not difficult at all Not difficult at all Not difficult at all     7. Primary insomnia Is on ambien to sleep. Sleeps about 7-8 ours a  night  8. RLS (restless legs syndrome) Is on mirapex which helps  9. Fibromyalgia Patient takes baclofen and neurontin along with her pain medication Pain assessment: Cause of pain- fibromyaglia Pain location- varies from day to day Pain on scale of 1-10- 7-8/10 Frequency- daily What increases pain-to much activity What makes pain Better-rest helps Effects on ADL - none Any change in general medical condition-none  Current opioids rx- ultram TID # meds rx- 90 Effectiveness of current meds-nothing Adverse reactions from pain meds-none Morphine equivalent- 15MME  Pill count performed-Yes Last drug screen - 07/11/20 ( high risk q42m moderate risk q645mlow risk yearly ) Urine drug screen today- Yes Was the [place]- yes  If yes were their any concerning findings? - no   Overdose risk: 1   Pain contract signed on:04/29/22   10. Severe obesity (BMI >= 40) (HCC) No recent weight changes Wt Readings from Last 3 Encounters:  07/24/22 292 lb (132.5 kg)  04/14/22 293 lb (132.9 kg)  01/13/22 294 lb (133.4 kg)   BMI Readings from Last 3 Encounters:  07/24/22 55.17 kg/m  04/14/22 55.36 kg/m  01/13/22 55.55 kg/m     New complaints: None today  Allergies  Allergen Reactions   Bee Pollen    Butrans [Buprenorphine] Nausea And Vomiting  patch   Coconut (Cocos Nucifera)    Mucinex [Guaifenesin Er] Rash   Outpatient Encounter Medications as of 07/24/2022  Medication Sig   albuterol (VENTOLIN HFA) 108 (90 Base) MCG/ACT inhaler USE 2 INHALATIONS EVERY 6 HOURS AS NEEDED FOR WHEEZING   amLODipine (NORVASC) 5 MG tablet Take 1 tablet (5 mg total) by mouth daily.   baclofen (LIORESAL) 10 MG tablet Take 1 tablet (10 mg total) by mouth 3 (three) times daily.   DULoxetine (CYMBALTA) 60 MG capsule Take 1 capsule (60 mg total) by mouth daily.   EPINEPHrine 0.3 mg/0.3 mL IJ SOAJ injection Inject 0.3 mLs (0.3 mg total) into the muscle once.   fesoterodine (TOVIAZ) 8 MG TB24  tablet Take 1 tablet (8 mg total) by mouth daily.   gabapentin (NEURONTIN) 300 MG capsule Take 1 capsule (300 mg total) by mouth 3 (three) times daily.   lisinopril-hydrochlorothiazide (ZESTORETIC) 20-12.5 MG tablet Take 2 tablets by mouth daily.   medroxyPROGESTERone (PROVERA) 10 MG tablet TAKE 1 TABLET DAILY   omeprazole (PRILOSEC) 40 MG capsule Take 1 capsule (40 mg total) by mouth daily.   pantoprazole (PROTONIX) 40 MG tablet Take 1 tablet (40 mg total) by mouth daily.   pramipexole (MIRAPEX) 1 MG tablet TAKE 1 TABLET TWICE A DAY   rosuvastatin (CRESTOR) 20 MG tablet TAKE 1 TABLET DAILY. REPLACES LIPITOR   topiramate (TOPAMAX) 50 MG tablet Take 1 tablet (50 mg total) by mouth 3 (three) times daily.   traMADol (ULTRAM) 50 MG tablet Take 1 tablet (50 mg total) by mouth every 8 (eight) hours as needed.   traMADol (ULTRAM) 50 MG tablet Take 1 tablet (50 mg total) by mouth every 8 (eight) hours as needed.   traMADol (ULTRAM) 50 MG tablet Take 1 tablet (50 mg total) by mouth every 8 (eight) hours as needed.   traMADol (ULTRAM) 50 MG tablet TAKE 1 TABLET BY MOUTH EVERY 8 HOURS AS NEEDED.   zolpidem (AMBIEN) 10 MG tablet Take 1 tablet (10 mg total) by mouth at bedtime.   No facility-administered encounter medications on file as of 07/24/2022.    Past Surgical History:  Procedure Laterality Date   BREAST BIOPSY Left    x 2 / 10-12 years ago / benign   BREAST SURGERY     biopsy times 2   JOINT REPLACEMENT Right    JOINT REPLACEMENT Left    SPINE SURGERY     cervical spine    Family History  Problem Relation Age of Onset   Diabetes Mother    Heart attack Father 86   Diabetes Father    Breast cancer Maternal Grandmother    Stroke Maternal Grandmother    Colon cancer Other        family history        Review of Systems  Constitutional:  Negative for diaphoresis.  Eyes:  Negative for pain.  Respiratory:  Negative for shortness of breath.   Cardiovascular:  Negative for chest  pain, palpitations and leg swelling.  Gastrointestinal:  Negative for abdominal pain.  Endocrine: Negative for polydipsia.  Skin:  Negative for rash.  Neurological:  Negative for dizziness, weakness and headaches.  Hematological:  Does not bruise/bleed easily.  All other systems reviewed and are negative.      Objective:   Physical Exam Vitals and nursing note reviewed.  Constitutional:      General: She is not in acute distress.    Appearance: Normal appearance. She is well-developed.  HENT:  Head: Normocephalic.     Right Ear: Tympanic membrane normal.     Left Ear: Tympanic membrane normal.     Nose: Nose normal.     Mouth/Throat:     Mouth: Mucous membranes are moist.  Eyes:     Pupils: Pupils are equal, round, and reactive to light.  Neck:     Vascular: No carotid bruit or JVD.  Cardiovascular:     Rate and Rhythm: Normal rate and regular rhythm.     Heart sounds: Normal heart sounds.  Pulmonary:     Effort: Pulmonary effort is normal. No respiratory distress.     Breath sounds: Normal breath sounds. No wheezing or rales.  Chest:     Chest wall: No tenderness.  Abdominal:     General: Bowel sounds are normal. There is no distension or abdominal bruit.     Palpations: Abdomen is soft. There is no hepatomegaly, splenomegaly, mass or pulsatile mass.     Tenderness: There is no abdominal tenderness.  Musculoskeletal:        General: Normal range of motion.     Cervical back: Normal range of motion and neck supple.     Right lower leg: Edema (2+) present.     Left lower leg: Edema (2+) present.     Comments: Walking with walker  Lymphadenopathy:     Cervical: No cervical adenopathy.  Skin:    General: Skin is warm and dry.  Neurological:     Mental Status: She is alert and oriented to person, place, and time.     Deep Tendon Reflexes: Reflexes are normal and symmetric.  Psychiatric:        Behavior: Behavior normal.        Thought Content: Thought content  normal.        Judgment: Judgment normal.     BP 132/78   Pulse 86   Temp 97.9 F (36.6 C) (Temporal)   Resp 20   Ht '5\' 1"'$  (1.549 m)   Wt 292 lb (132.5 kg)   LMP 09/13/2012   SpO2 96%   BMI 55.17 kg/m         Assessment & Plan:   Kamarea Feland in today with chief complaint of Annual Exam   1. Annual physical exam  2. Primary hypertension Low sodium diet - CBC with Differential/Platelet - CMP14+EGFR  3. Migraine without aura and without status migrainosus, not intractable Continue topamax Avoid caffeine  4. Mild intermittent chronic asthma without complication  5. Gastroesophageal reflux disease without esophagitis Avoid spicy foods Do not eat 2 hours prior to bedtime  6. Mixed hyperlipidemia Low fta diet - Lipid panel  7. Recurrent major depressive disorder, in full remission ([place]) Stress management  8. Primary insomnia Bedtime routine - zolpidem (AMBIEN) 10 MG tablet; Take 1 tablet (10 mg total) by mouth at bedtime.  Dispense: 30 tablet; Refill: 5  9. RLS (restless legs syndrome) Keep legs warm at night  10. Fibromyalgia Moist heat Encouraged to exercise - traMADol (ULTRAM) 50 MG tablet; Take 1 tablet (50 mg total) by mouth every 8 (eight) hours as needed.  Dispense: 90 tablet; Refill: 0 - traMADol (ULTRAM) 50 MG tablet; Take 1 tablet (50 mg total) by mouth every 8 (eight) hours as needed.  Dispense: 90 tablet; Refill: 0 - traMADol (ULTRAM) 50 MG tablet; Take 1 tablet (50 mg total) by mouth every 8 (eight) hours as needed.  Dispense: 90 tablet; Refill: 0  11. Severe obesity (BMI >= 40) (  [place]) Discussed diet and exercise for person with BMI >25 Will recheck weight in 3-6 months     The above assessment and management plan was discussed with the patient. The patient verbalized understanding of and has agreed to the management plan. Patient is aware to call the clinic if symptoms persist or worsen. Patient is aware when to return to the clinic for a  follow-up visit. Patient educated on when it is appropriate to go to the emergency department.   Mary-Margaret Hassell Done, FNP

## 2022-07-24 NOTE — Patient Instructions (Signed)
Fall Prevention in the Home, Adult Falls can cause injuries and can happen to people of all ages. There are many things you can do to make your home safer and to help prevent falls. What actions can I take to prevent falls? General information Use good lighting in all rooms. Make sure to: Replace any light bulbs that burn out. Turn on the lights in dark areas and use night-lights. Keep items that you use often in easy-to-reach places. Lower the shelves around your home if needed. Move furniture so that there are clear paths around it. Do not use throw rugs or other things on the floor that can make you trip. If any of your floors are uneven, fix them. Add color or contrast paint or tape to clearly mark and help you see: Grab bars or handrails. First and last steps of staircases. Where the edge of each step is. If you use a ladder or stepladder: Make sure that it is fully opened. Do not climb a closed ladder. Make sure the sides of the ladder are locked in place. Have someone hold the ladder while you use it. Know where your pets are as you move through your home. What can I do in the bathroom?     Keep the floor dry. Clean up any water on the floor right away. Remove soap buildup in the bathtub or shower. Buildup makes bathtubs and showers slippery. Use non-skid mats or decals on the floor of the bathtub or shower. Attach bath mats securely with double-sided, non-slip rug tape. If you need to sit down in the shower, use a non-slip stool. Install grab bars by the toilet and in the bathtub and shower. Do not use towel bars as grab bars. What can I do in the bedroom? Make sure that you have a light by your bed that is easy to reach. Do not use any sheets or blankets on your bed that hang to the floor. Have a firm chair or bench with side arms that you can use for support when you get dressed. What can I do in the kitchen? Clean up any spills right away. If you need to reach something  above you, use a step stool with a grab bar. Keep electrical cords out of the way. Do not use floor polish or wax that makes floors slippery. What can I do with my stairs? Do not leave anything on the stairs. Make sure that you have a light switch at the top and the bottom of the stairs. Make sure that there are handrails on both sides of the stairs. Fix handrails that are broken or loose. Install non-slip stair treads on all your stairs if they do not have carpet. Avoid having throw rugs at the top or bottom of the stairs. Choose a carpet that does not hide the edge of the steps on the stairs. Make sure that the carpet is firmly attached to the stairs. Fix carpet that is loose or worn. What can I do on the outside of my home? Use bright outdoor lighting. Fix the edges of walkways and driveways and fix any cracks. Clear paths of anything that can make you trip, such as tools or rocks. Add color or contrast paint or tape to clearly mark and help you see anything that might make you trip as you walk through a door, such as a raised step or threshold. Trim any bushes or trees on paths to your home. Check to see if handrails are loose  or broken and that both sides of all steps have handrails. Install guardrails along the edges of any raised decks and porches. Have leaves, snow, or ice cleared regularly. Use sand, salt, or ice melter on paths if you live where there is ice and snow during the winter. Clean up any spills in your garage right away. This includes grease or oil spills. What other actions can I take? Review your medicines with your doctor. Some medicines can cause dizziness or changes in blood pressure, which increase your risk of falling. Wear shoes that: Have a low heel. Do not wear high heels. Have rubber bottoms and are closed at the toe. Feel good on your feet and fit well. Use tools that help you move around if needed. These include: Canes. Walkers. Scooters. Crutches. Ask  your doctor what else you can do to help prevent falls. This may include seeing a physical therapist to learn to do exercises to move better and get stronger. Where to find more information Centers for Disease Control and Prevention, [place]: StoreMirror.com.cy [place] on Aging: AquariamThe[employer].co.nz National Institute on Aging: AquariamTheater.co.nz Contact a doctor if: You are afraid of falling at home. You feel weak, drowsy, or dizzy at home. You fall at home. Get help right away if you: Lose consciousness or have trouble moving after a fall. Have a fall that causes a head injury. These symptoms may be an emergency. Get help right away. Call 911. Do not wait to see if the symptoms will go away. Do not drive yourself to the hospital. This information is not intended to replace advice given to you by your health care provider. Make sure you discuss any questions you have with your health care provider. Document Revised: 01/19/2022 Document Reviewed: 01/19/2022 Elsevier Patient Education  [place].

## 2022-07-29 LAB — DRUG SCREEN 10 W/CONF, SERUM
Amphetamines, IA: NEGATIVE ng/mL
Barbiturates, IA: NEGATIVE ug/mL
Benzodiazepines, IA: NEGATIVE ng/mL
Cocaine & Metabolite, IA: NEGATIVE ng/mL
Methadone, IA: NEGATIVE ng/mL
Opiates, IA: NEGATIVE ng/mL
Oxycodones, IA: NEGATIVE ng/mL
Phencyclidine, IA: NEGATIVE ng/mL
Propoxyphene, IA: NEGATIVE ng/mL
THC(Marijuana) Metabolite, IA: NEGATIVE ng/mL

## 2022-07-29 LAB — LIPID PANEL
Chol/HDL Ratio: 4 ratio (ref 0.0–4.4)
Cholesterol, Total: 245 mg/dL — ABNORMAL HIGH (ref 100–199)
HDL: 61 mg/dL (ref >39)
LDL Chol Calc (NIH): 164 mg/dL — ABNORMAL HIGH (ref 0–99)
Triglycerides: 113 mg/dL (ref 0–149)
VLDL Cholesterol Cal: 20 mg/dL (ref 5–40)

## 2022-07-29 LAB — CBC WITH DIFFERENTIAL/PLATELET
Basophils Absolute: 0.1 10*3/uL (ref 0.0–0.2)
Basos: 1 %
EOS (ABSOLUTE): 0.1 10*3/uL (ref 0.0–0.4)
Eos: 2 %
Hematocrit: 44 % (ref 34.0–46.6)
Hemoglobin: 14.8 g/dL (ref 11.1–15.9)
Immature Grans (Abs): 0 10*3/uL (ref 0.0–0.1)
Immature Granulocytes: 0 %
Lymphocytes Absolute: 2.4 10*3/uL (ref 0.7–3.1)
Lymphs: 30 %
MCH: 30.7 pg (ref 26.6–33.0)
MCHC: 33.6 g/dL (ref 31.5–35.7)
MCV: 91 fL (ref 79–97)
Monocytes Absolute: 0.4 10*3/uL (ref 0.1–0.9)
Monocytes: 5 %
Neutrophils Absolute: 4.9 10*3/uL (ref 1.4–7.0)
Neutrophils: 62 %
Platelets: 388 10*3/uL (ref 150–450)
RBC: 4.82 x10E6/uL (ref 3.77–5.28)
RDW: 12.6 % (ref 11.7–15.4)
WBC: 7.9 10*3/uL (ref 3.4–10.8)

## 2022-07-29 LAB — CMP14+EGFR
ALT: 31 IU/L (ref 0–32)
AST: 17 IU/L (ref 0–40)
Albumin/Globulin Ratio: 1.8 (ref 1.2–2.2)
Albumin: 4.8 g/dL (ref 3.9–4.9)
Alkaline Phosphatase: 127 IU/L — ABNORMAL HIGH (ref 44–121)
BUN/Creatinine Ratio: 18 (ref 12–28)
BUN: 14 mg/dL (ref 8–27)
Bilirubin Total: 0.3 mg/dL (ref 0.0–1.2)
CO2: 23 mmol/L (ref 20–29)
Calcium: 9.1 mg/dL (ref 8.7–10.3)
Chloride: 105 mmol/L (ref 96–106)
Creatinine, Ser: 0.76 mg/dL (ref 0.57–1.00)
Globulin, Total: 2.7 g/dL (ref 1.5–4.5)
Glucose: 102 mg/dL — ABNORMAL HIGH (ref 70–99)
Potassium: 3.8 mmol/L (ref 3.5–5.2)
Sodium: 145 mmol/L — ABNORMAL HIGH (ref 134–144)
Total Protein: 7.5 g/dL (ref 6.0–8.5)
eGFR: 88 mL/min/{1.73_m2} (ref >59)

## 2022-10-08 ENCOUNTER — Other Ambulatory Visit: Payer: Self-pay | Admitting: Nurse Practitioner

## 2022-10-08 DIAGNOSIS — G2581 Restless legs syndrome: Secondary | ICD-10-CM

## 2022-10-12 ENCOUNTER — Other Ambulatory Visit: Payer: Self-pay | Admitting: Nurse Practitioner

## 2022-10-12 DIAGNOSIS — E782 Mixed hyperlipidemia: Secondary | ICD-10-CM

## 2022-10-20 ENCOUNTER — Other Ambulatory Visit: Payer: Self-pay

## 2022-10-20 ENCOUNTER — Ambulatory Visit: Admitting: Nurse Practitioner

## 2022-10-20 ENCOUNTER — Encounter: Payer: Self-pay | Admitting: Nurse Practitioner

## 2022-10-20 ENCOUNTER — Telehealth: Payer: Self-pay | Admitting: Nurse Practitioner

## 2022-10-20 VITALS — BP 156/82 | HR 101 | Temp 98.1°F | Resp 20 | Ht 61.0 in | Wt 298.0 lb

## 2022-10-20 DIAGNOSIS — I10 Essential (primary) hypertension: Secondary | ICD-10-CM

## 2022-10-20 DIAGNOSIS — G2581 Restless legs syndrome: Secondary | ICD-10-CM

## 2022-10-20 DIAGNOSIS — R3915 Urgency of urination: Secondary | ICD-10-CM

## 2022-10-20 DIAGNOSIS — G43009 Migraine without aura, not intractable, without status migrainosus: Secondary | ICD-10-CM

## 2022-10-20 DIAGNOSIS — E782 Mixed hyperlipidemia: Secondary | ICD-10-CM | POA: Diagnosis not present

## 2022-10-20 DIAGNOSIS — K219 Gastro-esophageal reflux disease without esophagitis: Secondary | ICD-10-CM

## 2022-10-20 DIAGNOSIS — F5101 Primary insomnia: Secondary | ICD-10-CM

## 2022-10-20 DIAGNOSIS — M797 Fibromyalgia: Secondary | ICD-10-CM

## 2022-10-20 DIAGNOSIS — F3342 Major depressive disorder, recurrent, in full remission: Secondary | ICD-10-CM

## 2022-10-20 MED ORDER — TRAMADOL HCL 50 MG PO TABS
50.0000 mg | ORAL_TABLET | Freq: Three times a day (TID) | ORAL | 0 refills | Status: DC | PRN
Start: 2022-12-19 — End: 2023-01-22

## 2022-10-20 MED ORDER — GABAPENTIN 300 MG PO CAPS
300.0000 mg | ORAL_CAPSULE | Freq: Three times a day (TID) | ORAL | 1 refills | Status: DC
Start: 2022-10-20 — End: 2023-04-26

## 2022-10-20 MED ORDER — TOPIRAMATE 50 MG PO TABS
50.0000 mg | ORAL_TABLET | Freq: Three times a day (TID) | ORAL | 1 refills | Status: DC
Start: 2022-10-20 — End: 2023-04-26

## 2022-10-20 MED ORDER — GABAPENTIN 300 MG PO CAPS
300.0000 mg | ORAL_CAPSULE | Freq: Three times a day (TID) | ORAL | 1 refills | Status: DC
Start: 2022-10-20 — End: 2022-10-20

## 2022-10-20 MED ORDER — TRAMADOL HCL 50 MG PO TABS: 50.0000 mg | ORAL_TABLET | Freq: Three times a day (TID) | ORAL | 0 refills | Status: DC | PRN

## 2022-10-20 MED ORDER — ZOLPIDEM TARTRATE 10 MG PO TABS
10.0000 mg | ORAL_TABLET | Freq: Every day | ORAL | 5 refills | Status: DC
Start: 2022-10-20 — End: 2023-02-05

## 2022-10-20 MED ORDER — AMLODIPINE BESYLATE 5 MG PO TABS
5.0000 mg | ORAL_TABLET | Freq: Every day | ORAL | 1 refills | Status: DC
Start: 2022-10-20 — End: 2023-04-26

## 2022-10-20 MED ORDER — ROSUVASTATIN CALCIUM 20 MG PO TABS
ORAL_TABLET | ORAL | 0 refills | Status: DC
Start: 2022-10-20 — End: 2022-10-20

## 2022-10-20 MED ORDER — DULOXETINE HCL 60 MG PO CPEP
60.0000 mg | ORAL_CAPSULE | Freq: Every day | ORAL | 1 refills | Status: DC
Start: 2022-10-20 — End: 2022-10-20

## 2022-10-20 MED ORDER — LISINOPRIL-HYDROCHLOROTHIAZIDE 20-12.5 MG PO TABS
2.0000 | ORAL_TABLET | Freq: Every day | ORAL | 1 refills | Status: DC
Start: 2022-10-20 — End: 2023-04-26

## 2022-10-20 MED ORDER — ROSUVASTATIN CALCIUM 20 MG PO TABS
ORAL_TABLET | ORAL | 0 refills | Status: DC
Start: 2022-10-20 — End: 2023-03-18

## 2022-10-20 MED ORDER — AMLODIPINE BESYLATE 5 MG PO TABS
5.0000 mg | ORAL_TABLET | Freq: Every day | ORAL | 1 refills | Status: DC
Start: 2022-10-20 — End: 2022-10-20

## 2022-10-20 MED ORDER — FESOTERODINE FUMARATE ER 8 MG PO TB24
8.0000 mg | ORAL_TABLET | Freq: Every day | ORAL | 1 refills | Status: DC
Start: 2022-10-20 — End: 2022-10-20

## 2022-10-20 MED ORDER — PANTOPRAZOLE SODIUM 40 MG PO TBEC
40.0000 mg | DELAYED_RELEASE_TABLET | Freq: Every day | ORAL | 1 refills | Status: DC
Start: 2022-10-20 — End: 2023-04-26

## 2022-10-20 MED ORDER — PRAMIPEXOLE DIHYDROCHLORIDE 1 MG PO TABS
1.0000 mg | ORAL_TABLET | Freq: Two times a day (BID) | ORAL | 0 refills | Status: DC
Start: 2022-10-20 — End: 2022-10-20

## 2022-10-20 MED ORDER — LISINOPRIL-HYDROCHLOROTHIAZIDE 20-12.5 MG PO TABS
2.0000 | ORAL_TABLET | Freq: Every day | ORAL | 1 refills | Status: DC
Start: 2022-10-20 — End: 2022-10-20

## 2022-10-20 MED ORDER — TOPIRAMATE 50 MG PO TABS
50.0000 mg | ORAL_TABLET | Freq: Three times a day (TID) | ORAL | 1 refills | Status: DC
Start: 2022-10-20 — End: 2022-10-20

## 2022-10-20 MED ORDER — PANTOPRAZOLE SODIUM 40 MG PO TBEC: 40.0000 mg | DELAYED_RELEASE_TABLET | Freq: Every day | ORAL | 1 refills | Status: DC

## 2022-10-20 MED ORDER — OMEPRAZOLE 40 MG PO CPDR
40.0000 mg | DELAYED_RELEASE_CAPSULE | Freq: Every day | ORAL | 1 refills | Status: DC
Start: 2022-10-20 — End: 2023-04-26

## 2022-10-20 MED ORDER — PRAMIPEXOLE DIHYDROCHLORIDE 1 MG PO TABS
1.0000 mg | ORAL_TABLET | Freq: Two times a day (BID) | ORAL | 0 refills | Status: DC
Start: 2022-10-20 — End: 2023-03-15

## 2022-10-20 MED ORDER — DULOXETINE HCL 60 MG PO CPEP
60.0000 mg | ORAL_CAPSULE | Freq: Every day | ORAL | 1 refills | Status: DC
Start: 2022-10-20 — End: 2023-04-26

## 2022-10-20 MED ORDER — MEDROXYPROGESTERONE ACETATE 10 MG PO TABS: 10.0000 mg | ORAL_TABLET | Freq: Every day | ORAL | 1 refills | Status: DC

## 2022-10-20 MED ORDER — OMEPRAZOLE 40 MG PO CPDR
40.0000 mg | DELAYED_RELEASE_CAPSULE | Freq: Every day | ORAL | 1 refills | Status: DC
Start: 2022-10-20 — End: 2022-10-20

## 2022-10-20 MED ORDER — BACLOFEN 10 MG PO TABS
10.0000 mg | ORAL_TABLET | Freq: Three times a day (TID) | ORAL | 11 refills | Status: DC
Start: 2022-10-20 — End: 2023-04-26

## 2022-10-20 MED ORDER — BACLOFEN 10 MG PO TABS
10.0000 mg | ORAL_TABLET | Freq: Three times a day (TID) | ORAL | 11 refills | Status: DC
Start: 2022-10-20 — End: 2022-10-20

## 2022-10-20 MED ORDER — FESOTERODINE FUMARATE ER 8 MG PO TB24
8.0000 mg | ORAL_TABLET | Freq: Every day | ORAL | 1 refills | Status: DC
Start: 2022-10-20 — End: 2023-04-26

## 2022-10-20 NOTE — Addendum Note (Signed)
Addended by: Bennie Pierini on: 10/20/2022 02:26 PM   Modules accepted: Orders

## 2022-10-20 NOTE — Patient Instructions (Signed)
Our records indicate that you are due for your screening mammogram.  Please call the imaging center that does your yearly mammograms to make an appointment for a mammogram at your earliest convenience. Our office also has a mobile unit through the [facility] that comes to our location. Please call our office if you would like to make an appointment.   

## 2022-10-20 NOTE — Telephone Encounter (Signed)
Corrected refills and sent to [facility]. Cancelled unwanted rxs at [facility]. Patient notified and verbalized understanding

## 2022-10-20 NOTE — Telephone Encounter (Signed)
Pt called stating that all of her medicines are supposed to be sent to [facility] EXCEPT for her Ambien and Tramadol. Those are the only 2 meds that should be sent to [facility].  Please send medications to correct pharmacies.

## 2022-10-20 NOTE — Progress Notes (Addendum)
Subjective:    Patient ID: Marie Carter, female    DOB: 19-Feb-1961, 62 y.o.   MRN: [id number]  Chief Complaint: medical management of chronic issues     HPI:  Marie Carter is a 62 y.o. who identifies as a female who was assigned female at birth.   Social history: Lives with: her husband Work history: disability   Comes in today for follow up of the following chronic medical issues:  1. Primary hypertension No c/o chest pain, sob or headache. Does not check blood pressure at home. BP Readings from Last 3 Encounters:  07/24/22 132/78  04/14/22 (!) 135/91  01/13/22 136/82     2. Mixed hyperlipidemia Does not watch diet and does no exercise. Lab Results  Component Value Date   CHOL 245 (H) 07/24/2022   HDL 61 07/24/2022   LDLCALC 164 (H) 07/24/2022   TRIG 113 07/24/2022   CHOLHDL 4.0 07/24/2022     3. Migraine without aura and without status migrainosus, not intractable Is doing well. Very seldom has headaches.  4. Gastroesophageal reflux disease without esophagitis Is on omeprazole daily  5. Recurrent major depressive disorder, in full remission (HCC) Is on cymbalta and is doing well.    10/20/2022    2:00 PM 07/24/2022    2:02 PM 04/14/2022    2:25 PM  Depression screen PHQ 2/9  Decreased Interest 0 0 0  Down, Depressed, Hopeless 1 1 1   PHQ - 2 Score 1 1 1   Altered sleeping 0 0 0  Tired, decreased energy 1 1 1   Change in appetite 0 0 0  Feeling bad or failure about yourself  0 0 0  Trouble concentrating 0 0 0  Moving slowly or fidgety/restless 0 0 0  Suicidal thoughts 0 0 0  PHQ-9 Score 2 2 2   Difficult doing work/chores Not difficult at all Not difficult at all Not difficult at all     6. RLS (restless legs syndrome) Is on mirapex daily and that does well to help keep her legs still.  7. Primary insomnia Is on ambien and sleep swell.  8. Fibromyalgia Pain assessment: Cause of pain- fibromyalgia Pain location- varies from day to day Pain on  scale of 1-10- 5-6/10 Frequency- daily What increases pain-"everything" What makes pain Better-rest helps Effects on ADL - none Any change in general medical condition-none  Current opioids rx- ultram 50mg  TID # meds rx- 90 Effectiveness of current meds-helps Adverse reactions from pain meds-none Morphine equivalent-  Pill count performed-No Last drug screen - 04/14/22 ( high risk q5m, moderate risk q76m, low risk yearly ) Urine drug screen today- No Was the NCCSR reviewed- yes  If yes were their any concerning findings? - no   Overdose risk: 1    Pain contract signed on:04/29/22   9. Severe obesity (BMI >= 40) (HCC) Weight is up 6 lbs Wt Readings from Last 3 Encounters:  10/20/22 298 lb (135.2 kg)  07/24/22 292 lb (132.5 kg)  04/14/22 293 lb (132.9 kg)   BMI Readings from Last 3 Encounters:  10/20/22 56.31 kg/m  07/24/22 55.17 kg/m  04/14/22 55.36 kg/m     New complaints: None today  Allergies  Allergen Reactions   Bee Pollen    Butrans [Buprenorphine] Nausea And Vomiting    patch   Coconut (Cocos Nucifera)    Mucinex [Guaifenesin Er] Rash   Outpatient Encounter Medications as of 10/20/2022  Medication Sig   albuterol (VENTOLIN HFA) 108 (90 Base) MCG/ACT inhaler  USE 2 INHALATIONS EVERY 6 HOURS AS NEEDED FOR WHEEZING   amLODipine (NORVASC) 5 MG tablet Take 1 tablet (5 mg total) by mouth daily.   baclofen (LIORESAL) 10 MG tablet Take 1 tablet (10 mg total) by mouth 3 (three) times daily.   DULoxetine (CYMBALTA) 60 MG capsule Take 1 capsule (60 mg total) by mouth daily.   EPINEPHrine 0.3 mg/0.3 mL IJ SOAJ injection Inject 0.3 mLs (0.3 mg total) into the muscle once.   fesoterodine (TOVIAZ) 8 MG TB24 tablet Take 1 tablet (8 mg total) by mouth daily.   gabapentin (NEURONTIN) 300 MG capsule Take 1 capsule (300 mg total) by mouth 3 (three) times daily.   lisinopril-hydrochlorothiazide (ZESTORETIC) 20-12.5 MG tablet Take 2 tablets by mouth daily.    medroxyPROGESTERone (PROVERA) 10 MG tablet TAKE 1 TABLET DAILY   omeprazole (PRILOSEC) 40 MG capsule Take 1 capsule (40 mg total) by mouth daily.   pantoprazole (PROTONIX) 40 MG tablet Take 1 tablet (40 mg total) by mouth daily.   pramipexole (MIRAPEX) 1 MG tablet TAKE 1 TABLET TWICE A DAY   rosuvastatin (CRESTOR) 20 MG tablet TAKE 1 TABLET DAILY.   topiramate (TOPAMAX) 50 MG tablet Take 1 tablet (50 mg total) by mouth 3 (three) times daily.   traMADol (ULTRAM) 50 MG tablet TAKE 1 TABLET BY MOUTH EVERY 8 HOURS AS NEEDED.   traMADol (ULTRAM) 50 MG tablet Take 1 tablet (50 mg total) by mouth every 8 (eight) hours as needed.   traMADol (ULTRAM) 50 MG tablet Take 1 tablet (50 mg total) by mouth every 8 (eight) hours as needed.   traMADol (ULTRAM) 50 MG tablet Take 1 tablet (50 mg total) by mouth every 8 (eight) hours as needed.   zolpidem (AMBIEN) 10 MG tablet Take 1 tablet (10 mg total) by mouth at bedtime.   No facility-administered encounter medications on file as of 10/20/2022.    Past Surgical History:  Procedure Laterality Date   BREAST BIOPSY Left    x 2 / 10-12 years ago / benign   BREAST SURGERY     biopsy times 2   JOINT REPLACEMENT Right    JOINT REPLACEMENT Left    SPINE SURGERY     cervical spine    Family History  Problem Relation Age of Onset   Diabetes Mother    Heart attack Father 24   Diabetes Father    Breast cancer Maternal Grandmother    Stroke Maternal Grandmother    Colon cancer Other        family history       Controlled substance contract: n/a     Review of Systems  Constitutional:  Negative for diaphoresis.  Eyes:  Negative for pain.  Respiratory:  Negative for shortness of breath.   Cardiovascular:  Negative for chest pain, palpitations and leg swelling.  Gastrointestinal:  Negative for abdominal pain.  Endocrine: Negative for polydipsia.  Skin:  Negative for rash.  Neurological:  Negative for dizziness, weakness and headaches.   Hematological:  Does not bruise/bleed easily.  All other systems reviewed and are negative.      Objective:   Physical Exam Vitals and nursing note reviewed.  Constitutional:      General: She is not in acute distress.    Appearance: Normal appearance. She is well-developed.  HENT:     Head: Normocephalic.     Right Ear: Tympanic membrane normal.     Left Ear: Tympanic membrane normal.     Nose: Nose normal.  Mouth/Throat:     Mouth: Mucous membranes are moist.  Eyes:     Pupils: Pupils are equal, round, and reactive to light.  Neck:     Vascular: No carotid bruit or JVD.  Cardiovascular:     Rate and Rhythm: Normal rate and regular rhythm.     Heart sounds: Normal heart sounds.  Pulmonary:     Effort: Pulmonary effort is normal. No respiratory distress.     Breath sounds: Normal breath sounds. No wheezing or rales.  Chest:     Chest wall: No tenderness.  Abdominal:     General: Bowel sounds are normal. There is no distension or abdominal bruit.     Palpations: Abdomen is soft. There is no hepatomegaly, splenomegaly, mass or pulsatile mass.     Tenderness: There is no abdominal tenderness.  Musculoskeletal:        General: Normal range of motion.     Cervical back: Normal range of motion and neck supple.     Right lower leg: Edema (1+) present.     Left lower leg: Edema (1+) present.     Comments: Walking with walker  Lymphadenopathy:     Cervical: No cervical adenopathy.  Skin:    General: Skin is warm and dry.  Neurological:     Mental Status: She is alert and oriented to person, place, and time.     Deep Tendon Reflexes: Reflexes are normal and symmetric.  Psychiatric:        Behavior: Behavior normal.        Thought Content: Thought content normal.        Judgment: Judgment normal.    BP (!) 156/82   Pulse (!) 101   Temp 98.1 F (36.7 C) (Temporal)   Resp 20   Ht 5\' 1"  (1.549 m)   Wt 298 lb (135.2 kg)   LMP 09/13/2012   SpO2 96%   BMI 56.31 kg/m          Assessment & Plan:   Colee Drye comes in today with chief complaint of Medical Management of Chronic Issues   Diagnosis and orders addressed:  1. Primary hypertension Low sodium diet - amLODipine (NORVASC) 5 MG tablet; Take 1 tablet (5 mg total) by mouth daily.  Dispense: 90 tablet; Refill: 1 - lisinopril-hydrochlorothiazide (ZESTORETIC) 20-12.5 MG tablet; Take 2 tablets by mouth daily.  Dispense: 180 tablet; Refill: 1  2. Mixed hyperlipidemia Low fat diet - rosuvastatin (CRESTOR) 20 MG tablet; TAKE 1 TABLET DAILY.  Dispense: 90 tablet; Refill: 0  3. Migraine without aura and without status migrainosus, not intractable Avoid caffeine - topiramate (TOPAMAX) 50 MG tablet; Take 1 tablet (50 mg total) by mouth 3 (three) times daily.  Dispense: 270 tablet; Refill: 1  4. Gastroesophageal reflux disease without esophagitis Avoid spicy foods Do not eat 2 hours prior to bedtime  - omeprazole (PRILOSEC) 40 MG capsule; Take 1 capsule (40 mg total) by mouth daily.  Dispense: 90 capsule; Refill: 1 - pantoprazole (PROTONIX) 40 MG tablet; Take 1 tablet (40 mg total) by mouth daily.  Dispense: 90 tablet; Refill: 1  5. Recurrent major depressive disorder, in full remission [facility]) Stress management  6. RLS (restless legs syndrome) Keep legs warm at night - pramipexole (MIRAPEX) 1 MG tablet; Take 1 tablet (1 mg total) by mouth 2 (two) times daily.  Dispense: 180 tablet; Refill: 0  7. Primary insomnia Bedtime routine - zolpidem (AMBIEN) 10 MG tablet; Take 1 tablet (10 mg total) by mouth  at bedtime.  Dispense: 30 tablet; Refill: 5  8. Fibromyalgia  - traMADol (ULTRAM) 50 MG tablet; Take 1 tablet (50 mg total) by mouth every 8 (eight) hours as needed.  Dispense: 90 tablet; Refill: 0 - traMADol (ULTRAM) 50 MG tablet; Take 1 tablet (50 mg total) by mouth every 8 (eight) hours as needed.  Dispense: 90 tablet; Refill: 0 - traMADol (ULTRAM) 50 MG tablet; Take 1 tablet (50 mg total) by  mouth every 8 (eight) hours as needed.  Dispense: 90 tablet; Refill: 0 - baclofen (LIORESAL) 10 MG tablet; Take 1 tablet (10 mg total) by mouth 3 (three) times daily.  Dispense: 90 tablet; Refill: 11 - DULoxetine (CYMBALTA) 60 MG capsule; Take 1 capsule (60 mg total) by mouth daily.  Dispense: 90 capsule; Refill: 1 - gabapentin (NEURONTIN) 300 MG capsule; Take 1 capsule (300 mg total) by mouth 3 (three) times daily.  Dispense: 540 capsule; Refill: 1  9. Severe obesity (BMI >= 40) (HCC) Discussed diet and exercise for person with BMI >25 Will recheck weight in 3-6 months   10. Urinary urgency - fesoterodine (TOVIAZ) 8 MG TB24 tablet; Take 1 tablet (8 mg total) by mouth daily.  Dispense: 90 tablet; Refill: 1   Labs pending Health Maintenance reviewed Diet and exercise encouraged  Follow up plan: 3 months pain management   Mary-Margaret Daphine Deutscher, FNP

## 2022-10-21 LAB — CBC WITH DIFFERENTIAL/PLATELET
Basophils Absolute: 0.1 10*3/uL (ref 0.0–0.2)
Basos: 1 %
EOS (ABSOLUTE): 0.1 10*3/uL (ref 0.0–0.4)
Eos: 1 %
Hematocrit: 45.3 % (ref 34.0–46.6)
Hemoglobin: 14.7 g/dL (ref 11.1–15.9)
Immature Grans (Abs): 0 10*3/uL (ref 0.0–0.1)
Immature Granulocytes: 0 %
Lymphocytes Absolute: 2 10*3/uL (ref 0.7–3.1)
Lymphs: 26 %
MCH: 29.5 pg (ref 26.6–33.0)
MCHC: 32.5 g/dL (ref 31.5–35.7)
MCV: 91 fL (ref 79–97)
Monocytes Absolute: 0.4 10*3/uL (ref 0.1–0.9)
Monocytes: 5 %
Neutrophils Absolute: 5.1 10*3/uL (ref 1.4–7.0)
Neutrophils: 67 %
Platelets: 367 10*3/uL (ref 150–450)
RBC: 4.98 x10E6/uL (ref 3.77–5.28)
RDW: 12.8 % (ref 11.7–15.4)
WBC: 7.7 10*3/uL (ref 3.4–10.8)

## 2022-10-21 LAB — LIPID PANEL
Chol/HDL Ratio: 3.8 ratio (ref 0.0–4.4)
Cholesterol, Total: 245 mg/dL — ABNORMAL HIGH (ref 100–199)
HDL: 65 mg/dL (ref >39)
LDL Chol Calc (NIH): 161 mg/dL — ABNORMAL HIGH (ref 0–99)
Triglycerides: 107 mg/dL (ref 0–149)
VLDL Cholesterol Cal: 19 mg/dL (ref 5–40)

## 2022-10-21 LAB — CMP14+EGFR
ALT: 31 IU/L (ref 0–32)
AST: 20 IU/L (ref 0–40)
Albumin/Globulin Ratio: 1.4 (ref 1.2–2.2)
Albumin: 4.3 g/dL (ref 3.9–4.9)
Alkaline Phosphatase: 130 IU/L — ABNORMAL HIGH (ref 44–121)
BUN/Creatinine Ratio: 13 (ref 12–28)
BUN: 17 mg/dL (ref 8–27)
Bilirubin Total: 0.3 mg/dL (ref 0.0–1.2)
CO2: 21 mmol/L (ref 20–29)
Calcium: 8.6 mg/dL — ABNORMAL LOW (ref 8.7–10.3)
Chloride: 105 mmol/L (ref 96–106)
Creatinine, Ser: 1.35 mg/dL — ABNORMAL HIGH (ref 0.57–1.00)
Globulin, Total: 3 g/dL (ref 1.5–4.5)
Glucose: 105 mg/dL — ABNORMAL HIGH (ref 70–99)
Potassium: 3.9 mmol/L (ref 3.5–5.2)
Sodium: 142 mmol/L (ref 134–144)
Total Protein: 7.3 g/dL (ref 6.0–8.5)
eGFR: 44 mL/min/{1.73_m2} — ABNORMAL LOW (ref >59)

## 2022-11-05 ENCOUNTER — Other Ambulatory Visit: Payer: Self-pay | Admitting: Nurse Practitioner

## 2022-11-05 DIAGNOSIS — M797 Fibromyalgia: Secondary | ICD-10-CM

## 2023-01-11 ENCOUNTER — Other Ambulatory Visit: Payer: Self-pay | Admitting: Nurse Practitioner

## 2023-01-22 ENCOUNTER — Ambulatory Visit: Admitting: Nurse Practitioner

## 2023-01-22 ENCOUNTER — Encounter: Payer: Self-pay | Admitting: Nurse Practitioner

## 2023-01-22 VITALS — BP 148/93 | HR 78 | Temp 98.0°F | Resp 20 | Ht 61.0 in | Wt 297.0 lb

## 2023-01-22 DIAGNOSIS — M797 Fibromyalgia: Secondary | ICD-10-CM | POA: Diagnosis not present

## 2023-01-22 MED ORDER — TRAMADOL HCL 50 MG PO TABS
50.0000 mg | ORAL_TABLET | Freq: Three times a day (TID) | ORAL | 0 refills | Status: DC | PRN
Start: 2023-01-22 — End: 2023-04-26

## 2023-01-22 MED ORDER — TRAMADOL HCL 50 MG PO TABS
50.0000 mg | ORAL_TABLET | Freq: Three times a day (TID) | ORAL | 0 refills | Status: DC | PRN
Start: 2023-03-23 — End: 2023-04-26

## 2023-01-22 MED ORDER — TRAMADOL HCL 50 MG PO TABS
50.0000 mg | ORAL_TABLET | Freq: Three times a day (TID) | ORAL | 0 refills | Status: DC | PRN
Start: 2023-02-21 — End: 2023-04-26

## 2023-01-22 NOTE — Progress Notes (Signed)
Subjective:    Patient ID: Marie Carter, female    DOB: 10-30-1960, 62 y.o.   MRN: [id number]   Chief Complaint: pain management  HPI  Pain assessment: Cause of pain- fibromyalgia Pain location- varies from day to day Pain on scale of 1-10- 6-7/10 Frequency- daily What increases pain-to much activity What makes pain Better-rest seems to help her Effects on ADL - none Any change in general medical condition-none  Current opioids rx- ultram 50mg  TID # meds rx- 90 Effectiveness of current meds-helps Adverse reactions from pain meds-none Morphine equivalent- 15 MME  Pill count performed-No Last drug screen - 04/14/22 ( high risk q81m, moderate risk q28m, low risk yearly ) Urine drug screen today- No Was the NCCSR reviewed- yes  If yes were their any concerning findings? - no   Overdose risk: 1   Pain contract signed on:04/29/22  Patient Active Problem List   Diagnosis Date Noted   Migraine without aura and without status migrainosus, not intractable 11/05/2014   Severe obesity (BMI >= 40) (HCC) 07/23/2014   RLS (restless legs syndrome) 01/15/2014   GERD (gastroesophageal reflux disease) 10/05/2012   Hyperlipidemia 10/05/2012   Fibromyalgia 10/05/2012   Depression 10/05/2012   Asthma, chronic 10/05/2012   Hypertension 10/05/2012   Insomnia 10/05/2012       Review of Systems  Constitutional:  Negative for diaphoresis.  Eyes:  Negative for pain.  Respiratory:  Negative for shortness of breath.   Cardiovascular:  Negative for chest pain, palpitations and leg swelling.  Gastrointestinal:  Negative for abdominal pain.  Endocrine: Negative for polydipsia.  Skin:  Negative for rash.  Neurological:  Negative for dizziness, weakness and headaches.  Hematological:  Does not bruise/bleed easily.  All other systems reviewed and are negative.      Objective:   Physical Exam Vitals and nursing note reviewed.  Constitutional:      General: She is not in acute  distress.    Appearance: Normal appearance. She is well-developed.  Neck:     Vascular: No carotid bruit or JVD.  Cardiovascular:     Rate and Rhythm: Normal rate and regular rhythm.     Heart sounds: Normal heart sounds.  Pulmonary:     Effort: Pulmonary effort is normal. No respiratory distress.     Breath sounds: Normal breath sounds. No wheezing or rales.  Chest:     Chest wall: No tenderness.  Abdominal:     General: Bowel sounds are normal. There is no distension or abdominal bruit.     Palpations: Abdomen is soft. There is no hepatomegaly, splenomegaly, mass or pulsatile mass.     Tenderness: There is no abdominal tenderness.  Musculoskeletal:        General: Normal range of motion.     Cervical back: Normal range of motion and neck supple.  Lymphadenopathy:     Cervical: No cervical adenopathy.  Skin:    General: Skin is warm and dry.  Neurological:     Mental Status: She is alert and oriented to person, place, and time.     Deep Tendon Reflexes: Reflexes are normal and symmetric.  Psychiatric:        Behavior: Behavior normal.        Thought Content: Thought content normal.        Judgment: Judgment normal.     BP (!) 148/93   Pulse 78   Temp 98 F (36.7 C) (Temporal)   Resp 20   Ht 5\' 1"  (1.549  m)   Wt 297 lb (134.7 kg)   LMP 09/13/2012   SpO2 94%   BMI 56.12 kg/m        Assessment & Plan:   Marie Carter in today with chief complaint of Pain Management   1. Fibromyalgia Keep muscle warm - traMADol (ULTRAM) 50 MG tablet; Take 1 tablet (50 mg total) by mouth every 8 (eight) hours as needed.  Dispense: 90 tablet; Refill: 0 - traMADol (ULTRAM) 50 MG tablet; Take 1 tablet (50 mg total) by mouth every 8 (eight) hours as needed.  Dispense: 90 tablet; Refill: 0 - traMADol (ULTRAM) 50 MG tablet; Take 1 tablet (50 mg total) by mouth every 8 (eight) hours as needed.  Dispense: 90 tablet; Refill: 0    The above assessment and management plan was discussed  with the patient. The patient verbalized understanding of and has agreed to the management plan. Patient is aware to call the clinic if symptoms persist or worsen. Patient is aware when to return to the clinic for a follow-up visit. Patient educated on when it is appropriate to go to the emergency department.   Mary-Margaret Daphine Deutscher, FNP

## 2023-02-04 ENCOUNTER — Encounter: Payer: Self-pay | Admitting: Nurse Practitioner

## 2023-02-04 ENCOUNTER — Telehealth (INDEPENDENT_AMBULATORY_CARE_PROVIDER_SITE_OTHER): Admitting: Nurse Practitioner

## 2023-02-04 DIAGNOSIS — H1033 Unspecified acute conjunctivitis, bilateral: Secondary | ICD-10-CM

## 2023-02-04 DIAGNOSIS — J40 Bronchitis, not specified as acute or chronic: Secondary | ICD-10-CM

## 2023-02-04 MED ORDER — AZITHROMYCIN 250 MG PO TABS
ORAL_TABLET | ORAL | 0 refills | Status: DC
Start: 2023-02-04 — End: 2023-04-26

## 2023-02-04 MED ORDER — PREDNISONE 20 MG PO TABS
40.0000 mg | ORAL_TABLET | Freq: Every day | ORAL | 0 refills | Status: AC
Start: 2023-02-04 — End: 2023-02-09

## 2023-02-04 MED ORDER — PROMETHAZINE-DM 6.25-15 MG/5ML PO SYRP
5.0000 mL | ORAL_SOLUTION | Freq: Four times a day (QID) | ORAL | 0 refills | Status: DC | PRN
Start: 2023-02-04 — End: 2023-04-26

## 2023-02-04 MED ORDER — POLYMYXIN B-TRIMETHOPRIM 10000-0.1 UNIT/ML-% OP SOLN: 2.0000 [drp] | OPHTHALMIC | 0 refills | Status: DC

## 2023-02-04 NOTE — Patient Instructions (Signed)

## 2023-02-04 NOTE — Progress Notes (Signed)
Virtual Visit Consent   Marie Carter, you are scheduled for a virtual visit with Mary-Margaret Daphine Deutscher, FNP, a [facility] provider, today.     Just as with appointments in the office, your consent must be obtained to participate.  Your consent will be active for this visit and any virtual visit you may have with one of our providers in the next 365 days.     If you have a MyChart account, a copy of this consent can be sent to you electronically.  All virtual visits are billed to your insurance company just like a traditional visit in the office.    As this is a virtual visit, video technology does not allow for your provider to perform a traditional examination.  This may limit your provider's ability to fully assess your condition.  If your provider identifies any concerns that need to be evaluated in person or the need to arrange testing (such as labs, EKG, etc.), we will make arrangements to do so.     Although advances in technology are sophisticated, we cannot ensure that it will always work on either your end or our end.  If the connection with a video visit is poor, the visit may have to be switched to a telephone visit.  With either a video or telephone visit, we are not always able to ensure that we have a secure connection.     I need to obtain your verbal consent now.   Are you willing to proceed with your visit today? YES   Marie Carter has provided verbal consent on 02/04/2023 for a virtual visit (video or telephone).   Mary-Margaret Daphine Deutscher, FNP   Date: 02/04/2023 10:10 AM   Virtual Visit via Video Note   I, Mary-Margaret Daphine Deutscher, connected with Marie Carter ([id number], [birth year]) on 02/04/23 at  4:30 PM EDT by a video-enabled telemedicine application and verified that I am speaking with the correct person using two identifiers.  Location: Patient: Virtual Visit Location Patient: Home Provider: Virtual Visit Location Provider: Mobile   I discussed the limitations of  evaluation and management by telemedicine and the availability of in person appointments. The patient expressed understanding and agreed to proceed.    History of Present Illness: Marie Carter is a 62 y.o. who identifies as a female who was assigned female at birth, and is being seen today for cough.  HPI: Cough This is a new problem. The current episode started in the past 7 days. The problem has been waxing and waning. The problem occurs constantly. The cough is Productive of sputum. Associated symptoms include ear congestion, headaches and rhinorrhea. Pertinent negatives include no fever. Nothing aggravates the symptoms. She has tried nothing for the symptoms. The treatment provided no relief. Her past medical history is significant for bronchitis.  Conjunctivitis  The current episode started 2 days ago. The onset was gradual. The problem occurs continuously. The problem has been rapidly worsening. The problem is moderate. Nothing relieves the symptoms. Associated symptoms include headaches, rhinorrhea and cough. Pertinent negatives include no fever. The eye pain is mild. Both eyes are affected. The eye pain is not associated with movement. The eyelid exhibits redness.    Review of Systems  Constitutional:  Negative for fever.  HENT:  Positive for rhinorrhea.   Respiratory:  Positive for cough.   Neurological:  Positive for headaches.    Problems:  Patient Active Problem List   Diagnosis Date Noted   Migraine without aura and without status migrainosus, not  intractable 11/05/2014   Severe obesity (BMI >= 40) (HCC) 07/23/2014   RLS (restless legs syndrome) 01/15/2014   GERD (gastroesophageal reflux disease) 10/05/2012   Hyperlipidemia 10/05/2012   Fibromyalgia 10/05/2012   Depression 10/05/2012   Asthma, chronic 10/05/2012   Hypertension 10/05/2012   Insomnia 10/05/2012    Allergies:  Allergies  Allergen Reactions   Bee Pollen    Butrans [Buprenorphine] Nausea And Vomiting     patch   Coconut (Cocos Nucifera)    Mucinex [Guaifenesin Er] Rash   Medications:  Current Outpatient Medications:    albuterol (VENTOLIN HFA) 108 (90 Base) MCG/ACT inhaler, USE 2 INHALATIONS EVERY 6 HOURS AS NEEDED FOR WHEEZING, Disp: 34 g, Rfl: 0   amLODipine (NORVASC) 5 MG tablet, Take 1 tablet (5 mg total) by mouth daily., Disp: 90 tablet, Rfl: 1   baclofen (LIORESAL) 10 MG tablet, Take 1 tablet (10 mg total) by mouth 3 (three) times daily., Disp: 90 tablet, Rfl: 11   DULoxetine (CYMBALTA) 60 MG capsule, Take 1 capsule (60 mg total) by mouth daily., Disp: 90 capsule, Rfl: 1   EPINEPHrine 0.3 mg/0.3 mL IJ SOAJ injection, Inject 0.3 mLs (0.3 mg total) into the muscle once., Disp: 2 Device, Rfl: 2   fesoterodine (TOVIAZ) 8 MG TB24 tablet, Take 1 tablet (8 mg total) by mouth daily., Disp: 90 tablet, Rfl: 1   gabapentin (NEURONTIN) 300 MG capsule, Take 1 capsule (300 mg total) by mouth 3 (three) times daily., Disp: 540 capsule, Rfl: 1   lisinopril-hydrochlorothiazide (ZESTORETIC) 20-12.5 MG tablet, Take 2 tablets by mouth daily., Disp: 180 tablet, Rfl: 1   medroxyPROGESTERone (PROVERA) 10 MG tablet, Take 1 tablet (10 mg total) by mouth daily., Disp: 90 tablet, Rfl: 1   omeprazole (PRILOSEC) 40 MG capsule, Take 1 capsule (40 mg total) by mouth daily., Disp: 90 capsule, Rfl: 1   pantoprazole (PROTONIX) 40 MG tablet, Take 1 tablet (40 mg total) by mouth daily., Disp: 90 tablet, Rfl: 1   pramipexole (MIRAPEX) 1 MG tablet, Take 1 tablet (1 mg total) by mouth 2 (two) times daily., Disp: 180 tablet, Rfl: 0   rosuvastatin (CRESTOR) 20 MG tablet, TAKE 1 TABLET DAILY., Disp: 90 tablet, Rfl: 0   topiramate (TOPAMAX) 50 MG tablet, Take 1 tablet (50 mg total) by mouth 3 (three) times daily., Disp: 270 tablet, Rfl: 1   traMADol (ULTRAM) 50 MG tablet, Take 1 tablet (50 mg total) by mouth every 8 (eight) hours as needed., Disp: 90 tablet, Rfl: 0   [START ON 02/21/2023] traMADol (ULTRAM) 50 MG tablet, Take 1 tablet  (50 mg total) by mouth every 8 (eight) hours as needed., Disp: 90 tablet, Rfl: 0   [START ON 03/23/2023] traMADol (ULTRAM) 50 MG tablet, Take 1 tablet (50 mg total) by mouth every 8 (eight) hours as needed., Disp: 90 tablet, Rfl: 0   zolpidem (AMBIEN) 10 MG tablet, Take 1 tablet (10 mg total) by mouth at bedtime., Disp: 30 tablet, Rfl: 5  Observations/Objective: Patient is well-developed, well-nourished in no acute distress.  Resting comfortably  at home.  Head is normocephalic, atraumatic.  No labored breathing.  Speech is clear and coherent with logical content.  Patient is alert and oriented at baseline.  Bil scleral injection Deep wet cough  Assessment and Plan:  Marie Carter in today with chief complaint of Cough   1. Acute bacterial conjunctivitis of both eyes Good handwashing Warm compresses - trimethoprim-polymyxin b (POLYTRIM) ophthalmic solution; Place 2 drops into both eyes every 4 (four)  hours.  Dispense: 10 mL; Refill: 0  2. Bronchitis 1. Take meds as prescribed 2. Use a cool mist humidifier especially during the winter months and when heat has been humid. 3. Use saline nose sprays frequently 4. Saline irrigations of the nose can be very helpful if done frequently.  * 4X daily for 1 week*  * Use of a nettie pot can be helpful with this. Follow directions with this* 5. Drink plenty of fluids 6. Keep thermostat turn down low 7.For any cough or congestion- promethazine dm 8. For fever or aces or pains- take tylenol or ibuprofen appropriate for age and weight.  * for fevers greater than 101 orally you may alternate ibuprofen and tylenol every  3 hours.    - promethazine-dextromethorphan (PROMETHAZINE-DM) 6.25-15 MG/5ML syrup; Take 5 mLs by mouth 4 (four) times daily as needed for cough.  Dispense: 118 mL; Refill: 0 - azithromycin (ZITHROMAX Z-PAK) 250 MG tablet; As directed  Dispense: 6 tablet; Refill: 0 - predniSONE (DELTASONE) 20 MG tablet; Take 2 tablets (40 mg  total) by mouth daily with breakfast for 5 days. 2 po daily for 5 days  Dispense: 10 tablet; Refill: 0    The above assessment and management plan was discussed with the patient. The patient verbalized understanding of and has agreed to the management plan. Patient is aware to call the clinic if symptoms persist or worsen. Patient is aware when to return to the clinic for a follow-up visit. Patient educated on when it is appropriate to go to the emergency department.   Mary-Margaret Daphine Deutscher, FNP    Follow Up Instructions: I discussed the assessment and treatment plan with the patient. The patient was provided an opportunity to ask questions and all were answered. The patient agreed with the plan and demonstrated an understanding of the instructions.  A copy of instructions were sent to the patient via MyChart.  The patient was advised to call back or seek an in-person evaluation if the symptoms worsen or if the condition fails to improve as anticipated.  Time:  I spent 8 minutes with the patient via telehealth technology discussing the above problems/concerns.    Mary-Margaret Daphine Deutscher, FNP

## 2023-02-05 ENCOUNTER — Telehealth: Payer: Self-pay | Admitting: Nurse Practitioner

## 2023-02-05 DIAGNOSIS — F5101 Primary insomnia: Secondary | ICD-10-CM

## 2023-02-05 MED ORDER — ZOLPIDEM TARTRATE 10 MG PO TABS
10.0000 mg | ORAL_TABLET | Freq: Every day | ORAL | 5 refills | Status: DC
Start: 2023-02-05 — End: 2023-04-26

## 2023-02-05 NOTE — Telephone Encounter (Signed)
Ambien sent to [facility].

## 2023-02-05 NOTE — Telephone Encounter (Signed)
Ambien prescription sent to [facility]  Meds ordered this encounter  Medications   zolpidem (AMBIEN) 10 MG tablet    Sig: Take 1 tablet (10 mg total) by mouth at bedtime.    Dispense:  30 tablet    Refill:  5    Order Specific Question:   Supervising Provider    Answer:   Nils Pyle [[id number]]   Mary-Margaret Daphine Deutscher, FNP

## 2023-02-05 NOTE — Telephone Encounter (Signed)
  Prescription Request  02/05/2023  Is this a "Controlled Substance" medicine? zolpidem (AMBIEN) 10 MG tablet   Have you seen your PCP in the last 2 weeks? 01/22/2023 for pain mgt  If YES, route message to pool  -  If NO, patient needs to be scheduled for appointment.  What is the name of the medication or equipment? zolpidem (AMBIEN) 10 MG tablet   Have you contacted your pharmacy to request a refill? Yes new rx   Which pharmacy would you like this sent to? [facility]   Patient notified that their request is being sent to the clinical staff for review and that they should receive a response within 2 business days.

## 2023-02-05 NOTE — Telephone Encounter (Addendum)
Pt seen 12/2322 in office with Gennette Pac  Tox due in November.  Last refilled in May 2024

## 2023-02-05 NOTE — Telephone Encounter (Signed)
Patient notified and verbalized understanding. 

## 2023-03-15 ENCOUNTER — Other Ambulatory Visit: Payer: Self-pay | Admitting: Nurse Practitioner

## 2023-03-15 DIAGNOSIS — G2581 Restless legs syndrome: Secondary | ICD-10-CM

## 2023-03-18 ENCOUNTER — Other Ambulatory Visit: Payer: Self-pay | Admitting: Nurse Practitioner

## 2023-03-18 DIAGNOSIS — E782 Mixed hyperlipidemia: Secondary | ICD-10-CM

## 2023-04-02 ENCOUNTER — Encounter: Payer: Self-pay | Admitting: Nurse Practitioner

## 2023-04-12 ENCOUNTER — Other Ambulatory Visit: Payer: Self-pay | Admitting: Nurse Practitioner

## 2023-04-26 ENCOUNTER — Encounter: Payer: Self-pay | Admitting: Nurse Practitioner

## 2023-04-26 ENCOUNTER — Other Ambulatory Visit: Payer: Self-pay | Admitting: Nurse Practitioner

## 2023-04-26 ENCOUNTER — Ambulatory Visit: Admitting: Nurse Practitioner

## 2023-04-26 VITALS — BP 148/78 | HR 91 | Temp 98.1°F | Resp 20 | Ht 61.0 in | Wt 307.0 lb

## 2023-04-26 DIAGNOSIS — F3342 Major depressive disorder, recurrent, in full remission: Secondary | ICD-10-CM

## 2023-04-26 DIAGNOSIS — I10 Essential (primary) hypertension: Secondary | ICD-10-CM | POA: Diagnosis not present

## 2023-04-26 DIAGNOSIS — K219 Gastro-esophageal reflux disease without esophagitis: Secondary | ICD-10-CM

## 2023-04-26 DIAGNOSIS — G2581 Restless legs syndrome: Secondary | ICD-10-CM

## 2023-04-26 DIAGNOSIS — F5101 Primary insomnia: Secondary | ICD-10-CM

## 2023-04-26 DIAGNOSIS — M797 Fibromyalgia: Secondary | ICD-10-CM

## 2023-04-26 DIAGNOSIS — G43009 Migraine without aura, not intractable, without status migrainosus: Secondary | ICD-10-CM

## 2023-04-26 DIAGNOSIS — E782 Mixed hyperlipidemia: Secondary | ICD-10-CM | POA: Diagnosis not present

## 2023-04-26 DIAGNOSIS — R3915 Urgency of urination: Secondary | ICD-10-CM

## 2023-04-26 MED ORDER — TRAMADOL HCL 50 MG PO TABS: 50.0000 mg | ORAL_TABLET | Freq: Three times a day (TID) | ORAL | 0 refills | Status: DC | PRN

## 2023-04-26 MED ORDER — BACLOFEN 10 MG PO TABS: 10.0000 mg | ORAL_TABLET | Freq: Three times a day (TID) | ORAL | 11 refills | Status: DC

## 2023-04-26 MED ORDER — PANTOPRAZOLE SODIUM 40 MG PO TBEC: 40.0000 mg | DELAYED_RELEASE_TABLET | Freq: Every day | ORAL | 1 refills | Status: DC

## 2023-04-26 MED ORDER — OMEPRAZOLE 40 MG PO CPDR: 40.0000 mg | DELAYED_RELEASE_CAPSULE | Freq: Every day | ORAL | 1 refills | Status: DC

## 2023-04-26 MED ORDER — LISINOPRIL-HYDROCHLOROTHIAZIDE 20-12.5 MG PO TABS: 2.0000 | ORAL_TABLET | Freq: Every day | ORAL | 1 refills | Status: DC

## 2023-04-26 MED ORDER — DULOXETINE HCL 60 MG PO CPEP: 60.0000 mg | ORAL_CAPSULE | Freq: Every day | ORAL | 1 refills | Status: DC

## 2023-04-26 MED ORDER — AMLODIPINE BESYLATE 5 MG PO TABS: 5.0000 mg | ORAL_TABLET | Freq: Every day | ORAL | 1 refills | Status: DC

## 2023-04-26 MED ORDER — ROSUVASTATIN CALCIUM 20 MG PO TABS: ORAL_TABLET | ORAL | 0 refills | Status: DC

## 2023-04-26 MED ORDER — ZOLPIDEM TARTRATE 10 MG PO TABS
10.0000 mg | ORAL_TABLET | Freq: Every day | ORAL | 5 refills | Status: DC
Start: 2023-04-26 — End: 2023-10-15

## 2023-04-26 MED ORDER — GABAPENTIN 300 MG PO CAPS: 300.0000 mg | ORAL_CAPSULE | Freq: Three times a day (TID) | ORAL | 1 refills | Status: DC

## 2023-04-26 MED ORDER — PRAMIPEXOLE DIHYDROCHLORIDE 1 MG PO TABS: 1.0000 mg | ORAL_TABLET | Freq: Two times a day (BID) | ORAL | 1 refills | Status: DC

## 2023-04-26 MED ORDER — TOPIRAMATE 50 MG PO TABS: 50.0000 mg | ORAL_TABLET | Freq: Three times a day (TID) | ORAL | 1 refills | Status: DC

## 2023-04-26 NOTE — Progress Notes (Signed)
Subjective:    Patient ID: Marie Carter, female    DOB: January 08, 1961, 62 y.o.   MRN: [id number]   Chief Complaint: medical management of chronic issues     HPI:  Marie Carter is a 62 y.o. who identifies as a female who was assigned female at birth.   Social history: Lives with: husband Work history: disability   Comes in today for follow up of the following chronic medical issues:  1. Primary hypertension No c/o chest pain, sob or headache.. doe snot check blood pressure at  home. BP Readings from Last 3 Encounters:  01/22/23 (!) 148/93  10/20/22 (!) 156/82  07/24/22 132/78     2. Mixed hyperlipidemia Does not watch diet and does no dedicated exercise. Is on crestor Lab Results  Component Value Date   CHOL 245 (H) 10/20/2022   HDL 65 10/20/2022   LDLCALC 161 (H) 10/20/2022   TRIG 107 10/20/2022   CHOLHDL 3.8 10/20/2022     3. Gastroesophageal reflux disease without esophagitis Takes protonix daily and is doing well  4. Migraine without aura and without status migrainosus, not intractable Has occasionally. Is on topamax for prevention  5. Recurrent major depressive disorder, in full remission (HCC) Currently not on any anti depressants    04/26/2023    3:05 PM 10/20/2022    2:00 PM 07/24/2022    2:02 PM  Depression screen PHQ 2/9  Decreased Interest 1 0 0  Down, Depressed, Hopeless 1 1 1   PHQ - 2 Score 2 1 1   Altered sleeping 0 0 0  Tired, decreased energy 0 1 1  Change in appetite 1 0 0  Feeling bad or failure about yourself  0 0 0  Trouble concentrating 0 0 0  Moving slowly or fidgety/restless 0 0 0  Suicidal thoughts 0 0 0  PHQ-9 Score 3 2 2   Difficult doing work/chores Somewhat difficult Not difficult at all Not difficult at all     6. Fibromyalgia Pain assessment: Cause of pain- fibromyalgia Pain location- varies from day to day Pain on scale of 1-10- 6-7/10 Frequency- daily What increases pain-to much activity What makes pain Better-rest  helps Effects on ADL - none Any change in general medical condition-no  Current opioids rx- ultram 50mg  TID # meds rx- 90 Effectiveness of current meds-helps Adverse reactions from pain meds-none Morphine equivalent-  Pill count performed-No Last drug screen - 07/24/22 ( high risk q29m, moderate risk q16m, low risk yearly ) Urine drug screen today- No Was the NCCSR reviewed- yes  If yes were their any concerning findings? - no   Overdose risk: 1   Pain contract signed on:04/29/22   7. RLS (restless legs syndrome) Is on mirapex which helps  8. Primary insomnia Ambien to sleep- cannot sleep without meds  9. Severe obesity (BMI >= 40) (HCC) Weight is up 10 lbs Wt Readings from Last 3 Encounters:  04/26/23 (!) 307 lb (139.3 kg)  01/22/23 297 lb (134.7 kg)  10/20/22 298 lb (135.2 kg)   BMI Readings from Last 3 Encounters:  04/26/23 58.01 kg/m  01/22/23 56.12 kg/m  10/20/22 56.31 kg/m     New complaints: None  today  Allergies  Allergen Reactions   Bee Pollen    Butrans [Buprenorphine] Nausea And Vomiting    patch   Coconut (Cocos Nucifera)    Mucinex [Guaifenesin Er] Rash   Outpatient Encounter Medications as of 04/26/2023  Medication Sig   albuterol (VENTOLIN HFA) 108 (90 Base) MCG/ACT  inhaler USE 2 INHALATIONS EVERY 6 HOURS AS NEEDED FOR WHEEZING   amLODipine (NORVASC) 5 MG tablet Take 1 tablet (5 mg total) by mouth daily.   azithromycin (ZITHROMAX Z-PAK) 250 MG tablet As directed   baclofen (LIORESAL) 10 MG tablet Take 1 tablet (10 mg total) by mouth 3 (three) times daily.   DULoxetine (CYMBALTA) 60 MG capsule Take 1 capsule (60 mg total) by mouth daily.   EPINEPHrine 0.3 mg/0.3 mL IJ SOAJ injection Inject 0.3 mLs (0.3 mg total) into the muscle once.   fesoterodine (TOVIAZ) 8 MG TB24 tablet TAKE 1 TABLET DAILY   gabapentin (NEURONTIN) 300 MG capsule Take 1 capsule (300 mg total) by mouth 3 (three) times daily.   lisinopril-hydrochlorothiazide  (ZESTORETIC) 20-12.5 MG tablet Take 2 tablets by mouth daily.   medroxyPROGESTERone (PROVERA) 10 MG tablet Take 1 tablet (10 mg total) by mouth daily.   omeprazole (PRILOSEC) 40 MG capsule Take 1 capsule (40 mg total) by mouth daily.   pantoprazole (PROTONIX) 40 MG tablet Take 1 tablet (40 mg total) by mouth daily.   pramipexole (MIRAPEX) 1 MG tablet TAKE 1 TABLET TWICE A DAY   promethazine-dextromethorphan (PROMETHAZINE-DM) 6.25-15 MG/5ML syrup Take 5 mLs by mouth 4 (four) times daily as needed for cough.   rosuvastatin (CRESTOR) 20 MG tablet TAKE 1 TABLET DAILY   topiramate (TOPAMAX) 50 MG tablet Take 1 tablet (50 mg total) by mouth 3 (three) times daily.   traMADol (ULTRAM) 50 MG tablet Take 1 tablet (50 mg total) by mouth every 8 (eight) hours as needed.   traMADol (ULTRAM) 50 MG tablet Take 1 tablet (50 mg total) by mouth every 8 (eight) hours as needed.   traMADol (ULTRAM) 50 MG tablet Take 1 tablet (50 mg total) by mouth every 8 (eight) hours as needed.   trimethoprim-polymyxin b (POLYTRIM) ophthalmic solution Place 2 drops into both eyes every 4 (four) hours.   zolpidem (AMBIEN) 10 MG tablet Take 1 tablet (10 mg total) by mouth at bedtime.   No facility-administered encounter medications on file as of 04/26/2023.    Past Surgical History:  Procedure Laterality Date   BREAST BIOPSY Left    x 2 / 10-12 years ago / benign   BREAST SURGERY     biopsy times 2   JOINT REPLACEMENT Right    JOINT REPLACEMENT Left    SPINE SURGERY     cervical spine    Family History  Problem Relation Age of Onset   Diabetes Mother    Heart attack Father 16   Diabetes Father    Breast cancer Maternal Grandmother    Stroke Maternal Grandmother    Colon cancer Other        family history          Review of Systems  Constitutional:  Negative for diaphoresis.  Eyes:  Negative for pain.  Respiratory:  Negative for shortness of breath.   Cardiovascular:  Negative for chest pain, palpitations  and leg swelling.  Gastrointestinal:  Negative for abdominal pain.  Endocrine: Negative for polydipsia.  Skin:  Negative for rash.  Neurological:  Negative for dizziness, weakness and headaches.  Hematological:  Does not bruise/bleed easily.  All other systems reviewed and are negative.      Objective:   Physical Exam Vitals and nursing note reviewed.  Constitutional:      General: She is not in acute distress.    Appearance: Normal appearance. She is well-developed.  HENT:     Head:  Normocephalic.     Right Ear: Tympanic membrane normal.     Left Ear: Tympanic membrane normal.     Nose: Nose normal.     Mouth/Throat:     Mouth: Mucous membranes are moist.  Eyes:     Pupils: Pupils are equal, round, and reactive to light.  Neck:     Vascular: No carotid bruit or JVD.  Cardiovascular:     Rate and Rhythm: Normal rate and regular rhythm.     Heart sounds: Normal heart sounds.  Pulmonary:     Effort: Pulmonary effort is normal. No respiratory distress.     Breath sounds: Normal breath sounds. No wheezing or rales.  Chest:     Chest wall: No tenderness.  Abdominal:     General: Bowel sounds are normal. There is no distension or abdominal bruit.     Palpations: Abdomen is soft. There is no hepatomegaly, splenomegaly, mass or pulsatile mass.     Tenderness: There is no abdominal tenderness.  Musculoskeletal:        General: Normal range of motion.     Cervical back: Normal range of motion and neck supple.     Comments: Walks with walker  Lymphadenopathy:     Cervical: No cervical adenopathy.  Skin:    General: Skin is warm and dry.  Neurological:     Mental Status: She is alert and oriented to person, place, and time.     Deep Tendon Reflexes: Reflexes are normal and symmetric.  Psychiatric:        Behavior: Behavior normal.        Thought Content: Thought content normal.        Judgment: Judgment normal.     BP (!) 148/78 (Cuff Size: Large)   Pulse 91   Temp 98.1  F (36.7 C) (Temporal)   Resp 20   Ht 5\' 1"  (1.549 m)   Wt (!) 307 lb (139.3 kg)   LMP 09/13/2012   SpO2 94%   BMI 58.01 kg/m          Assessment & Plan:   Ambrosia Usry comes in today with chief complaint of Pain Management   Diagnosis and orders addressed:  1. Primary hypertension Low sodium diet - CBC with Differential/Platelet - CMP14+EGFR - amLODipine (NORVASC) 5 MG tablet; Take 1 tablet (5 mg total) by mouth daily.  Dispense: 90 tablet; Refill: 1 - lisinopril-hydrochlorothiazide (ZESTORETIC) 20-12.5 MG tablet; Take 2 tablets by mouth daily.  Dispense: 180 tablet; Refill: 1  2. Mixed hyperlipidemia Low fat diet - Lipid panel - rosuvastatin (CRESTOR) 20 MG tablet; TAKE 1 TABLET DAILY.  Dispense: 90 tablet; Refill: 0  3. Gastroesophageal reflux disease without esophagitis Avoid spicy foods Do not eat 2 hours prior to bedtime  - omeprazole (PRILOSEC) 40 MG capsule; Take 1 capsule (40 mg total) by mouth daily.  Dispense: 90 capsule; Refill: 1 - pantoprazole (PROTONIX) 40 MG tablet; Take 1 tablet (40 mg total) by mouth daily.  Dispense: 90 tablet; Refill: 1  4. Migraine without aura and without status migrainosus, not intractable Avoid caffeine - topiramate (TOPAMAX) 50 MG tablet; Take 1 tablet (50 mg total) by mouth 3 (three) times daily.  Dispense: 270 tablet; Refill: 1  5. Recurrent major depressive disorder, in full remission [facility]) Stress management  6. Fibromyalgia Keep warm - ToxASSURE Select 13 (MW), Urine - baclofen (LIORESAL) 10 MG tablet; Take 1 tablet (10 mg total) by mouth 3 (three) times daily.  Dispense: 90 tablet; Refill:  11 - DULoxetine (CYMBALTA) 60 MG capsule; Take 1 capsule (60 mg total) by mouth daily.  Dispense: 90 capsule; Refill: 1 - gabapentin (NEURONTIN) 300 MG capsule; Take 1 capsule (300 mg total) by mouth 3 (three) times daily.  Dispense: 540 capsule; Refill: 1 - traMADol (ULTRAM) 50 MG tablet; Take 1 tablet (50 mg total) by mouth every  8 (eight) hours as needed.  Dispense: 90 tablet; Refill: 0 - traMADol (ULTRAM) 50 MG tablet; Take 1 tablet (50 mg total) by mouth every 8 (eight) hours as needed.  Dispense: 90 tablet; Refill: 0 - traMADol (ULTRAM) 50 MG tablet; Take 1 tablet (50 mg total) by mouth every 8 (eight) hours as needed.  Dispense: 90 tablet; Refill: 0  7. RLS (restless legs syndrome) - pramipexole (MIRAPEX) 1 MG tablet; Take 1 tablet (1 mg total) by mouth 2 (two) times daily.  Dispense: 180 tablet; Refill: 1  8. Primary insomnia Bedtime routine - zolpidem (AMBIEN) 10 MG tablet; Take 1 tablet (10 mg total) by mouth at bedtime.  Dispense: 30 tablet; Refill: 5  9. Severe obesity (BMI >= 40) (HCC) Discussed diet and exercise for person with BMI >25 Will recheck weight in 3-6 months    Labs pending Health Maintenance reviewed Diet and exercise encouraged  Follow up plan: 3 months   Mary-Margaret Daphine Deutscher, FNP

## 2023-06-21 ENCOUNTER — Telehealth: Payer: Self-pay | Admitting: Family Medicine

## 2023-06-21 NOTE — Telephone Encounter (Signed)
Please call pharmacy about this

## 2023-06-21 NOTE — Telephone Encounter (Signed)
Copied from CRM [id number]. Topic: Clinical - Prescription Issue >> Jun 21, 2023  8:48 AM Jorje Guild R wrote: Reason for CRM: Patient said medication listed below was sent to wrong pharmacy. [facility] to confirm the [facility] of prescription to [facility], [facility]). The medication is :traMADol (ULTRAM) 50 MG tablet

## 2023-06-21 NOTE — Telephone Encounter (Signed)
Called and spoke with patient and she states that pharmacy told her when she picked up her ultram today that she needed to contact the office and have them add additional refills. Advised patient that she has a follow up appt on Feb 20 and she should have enough to get through that appt with this refill she just picked up. Patient agreed and verbalized understanding

## 2023-06-21 NOTE — Telephone Encounter (Signed)
Looks like they were sent to correct pharmacy. Maybe the dates are incorrect? Will you review?

## 2023-06-28 ENCOUNTER — Other Ambulatory Visit: Payer: Self-pay | Admitting: Nurse Practitioner

## 2023-06-28 NOTE — Telephone Encounter (Signed)
MMM NTBS for annual PE RF sent to pharmacy

## 2023-06-28 NOTE — Telephone Encounter (Signed)
Pt has appt on 07/22/2023

## 2023-07-12 ENCOUNTER — Other Ambulatory Visit: Payer: Self-pay | Admitting: Nurse Practitioner

## 2023-07-22 ENCOUNTER — Encounter: Payer: Self-pay | Admitting: Nurse Practitioner

## 2023-07-22 ENCOUNTER — Ambulatory Visit: Admitting: Nurse Practitioner

## 2023-07-22 ENCOUNTER — Ambulatory Visit (INDEPENDENT_AMBULATORY_CARE_PROVIDER_SITE_OTHER): Admitting: Nurse Practitioner

## 2023-07-22 DIAGNOSIS — M797 Fibromyalgia: Secondary | ICD-10-CM

## 2023-07-22 MED ORDER — TRAMADOL HCL 50 MG PO TABS: 50.0000 mg | ORAL_TABLET | Freq: Three times a day (TID) | ORAL | 0 refills | Status: DC | PRN

## 2023-07-22 NOTE — Progress Notes (Signed)
Subjective:    Patient ID: Marie Carter, female    DOB: 1960-11-26, 63 y.o.   MRN: [id number]   Chief Complaint: pain management  HPI  Pain assessment: Cause of pain- fibromyalgia Pain location- varies from day to day Pain on scale of 1-10- 6-7/10 Frequency- daily What increases pain-to much activity What makes pain Better-rest seems to help her Effects on ADL - none Any change in general medical condition-none  Current opioids rx- ultram 50mg  TID # meds rx- 90 Effectiveness of current meds-helps Adverse reactions from pain meds-none Morphine equivalent- 15 MME  Pill count performed-No Last drug screen - 04/14/22 ( high risk q68m, moderate risk q78m, low risk yearly ) Urine drug screen today- No Was the NCCSR reviewed- yes  If yes were their any concerning findings? - no   Overdose risk: 1   Pain contract signed on:05/04/23 Last drug screen: 04/14/22  Patient Active Problem List   Diagnosis Date Noted   Migraine without aura and without status migrainosus, not intractable 11/05/2014   Severe obesity (BMI >= 40) (HCC) 07/23/2014   RLS (restless legs syndrome) 01/15/2014   GERD (gastroesophageal reflux disease) 10/05/2012   Hyperlipidemia 10/05/2012   Fibromyalgia 10/05/2012   Depression 10/05/2012   Asthma, chronic 10/05/2012   Hypertension 10/05/2012   Insomnia 10/05/2012       Review of Systems  Constitutional:  Negative for diaphoresis.  Eyes:  Negative for pain.  Respiratory:  Negative for shortness of breath.   Cardiovascular:  Negative for chest pain, palpitations and leg swelling.  Gastrointestinal:  Negative for abdominal pain.  Endocrine: Negative for polydipsia.  Skin:  Negative for rash.  Neurological:  Negative for dizziness, weakness and headaches.  Hematological:  Does not bruise/bleed easily.  All other systems reviewed and are negative.      Objective:   Physical Exam Vitals and nursing note reviewed.  Constitutional:       General: She is not in acute distress.    Appearance: Normal appearance. She is well-developed.  Neck:     Vascular: No carotid bruit or JVD.  Cardiovascular:     Rate and Rhythm: Normal rate and regular rhythm.     Heart sounds: Normal heart sounds.  Pulmonary:     Effort: Pulmonary effort is normal. No respiratory distress.     Breath sounds: Normal breath sounds. No wheezing or rales.  Chest:     Chest wall: No tenderness.  Abdominal:     General: Bowel sounds are normal. There is no distension or abdominal bruit.     Palpations: Abdomen is soft. There is no hepatomegaly, splenomegaly, mass or pulsatile mass.     Tenderness: There is no abdominal tenderness.  Musculoskeletal:        General: Normal range of motion.     Cervical back: Normal range of motion and neck supple.  Lymphadenopathy:     Cervical: No cervical adenopathy.  Skin:    General: Skin is warm and dry.  Neurological:     Mental Status: She is alert and oriented to person, place, and time.     Deep Tendon Reflexes: Reflexes are normal and symmetric.  Psychiatric:        Behavior: Behavior normal.        Thought Content: Thought content normal.        Judgment: Judgment normal.    BP (!) 145/89   Pulse 99   Temp 98.7 F (37.1 C) (Temporal)   Ht 5\' 1"  (1.549 m)  Wt (!) 305 lb 9.6 oz (138.6 kg)   LMP 09/13/2012   SpO2 96%   BMI 57.74 kg/m    LMP 09/13/2012        Assessment & Plan:   Marie Carter in today with chief complaint of No chief complaint on file.   1. Fibromyalgia Keep muscle warm - traMADol (ULTRAM) 50 MG tablet; Take 1 tablet (50 mg total) by mouth every 8 (eight) hours as needed.  Dispense: 90 tablet; Refill: 0 - traMADol (ULTRAM) 50 MG tablet; Take 1 tablet (50 mg total) by mouth every 8 (eight) hours as needed.  Dispense: 90 tablet; Refill: 0 - traMADol (ULTRAM) 50 MG tablet; Take 1 tablet (50 mg total) by mouth every 8 (eight) hours as needed.  Dispense: 90 tablet; Refill:  0    The above assessment and management plan was discussed with the patient. The patient verbalized understanding of and has agreed to the management plan. Patient is aware to call the clinic if symptoms persist or worsen. Patient is aware when to return to the clinic for a follow-up visit. Patient educated on when it is appropriate to go to the emergency department.   Mary-Margaret Daphine Deutscher, FNP

## 2023-07-26 ENCOUNTER — Other Ambulatory Visit: Payer: Self-pay | Admitting: Nurse Practitioner

## 2023-07-26 DIAGNOSIS — R3915 Urgency of urination: Secondary | ICD-10-CM

## 2023-08-01 ENCOUNTER — Other Ambulatory Visit: Payer: Self-pay | Admitting: Nurse Practitioner

## 2023-08-01 DIAGNOSIS — F5101 Primary insomnia: Secondary | ICD-10-CM

## 2023-08-05 ENCOUNTER — Other Ambulatory Visit: Payer: Self-pay | Admitting: Nurse Practitioner

## 2023-08-05 DIAGNOSIS — F5101 Primary insomnia: Secondary | ICD-10-CM

## 2023-08-05 NOTE — Telephone Encounter (Signed)
 Last Fill: 04/26/23  Last OV: 07/22/23 Next OV: 10/15/23  Routing to provider for review/authorization.

## 2023-08-05 NOTE — Telephone Encounter (Signed)
 Copied from CRM [id number]. Topic: Clinical - Medication Refill >> Aug 05, 2023  2:06 PM Alessandra Bevels wrote: Most Recent Primary Care Visit:  Provider: Bennie Pierini  Department: [facility]  Visit Type: ACUTE  Date: 07/22/2023  Medication: zolpidem (AMBIEN) 10 MG tablet [[id number]]  Has the patient contacted their pharmacy? Yes (Agent: If no, request that the patient contact the pharmacy for the refill. If patient does not wish to contact the pharmacy document the reason why and proceed with request.) (Agent: If yes, when and what did the pharmacy advise?)  Is this the correct pharmacy for this prescription? Yes If no, delete pharmacy and type the correct one.  This is the patient's preferred pharmacy:    [id number] [place] [place] - [address] [place] [address] Phone: [phone] Fax: [phone]   Has the prescription been filled recently? Yes  Is the patient out of the medication? Yes  Has the patient been seen for an appointment in the last year OR does the patient have an upcoming appointment? Yes  Can we respond through MyChart? Yes  Agent: Please be advised that Rx refills may take up to 3 business days. We ask that you follow-up with your pharmacy.

## 2023-08-23 ENCOUNTER — Other Ambulatory Visit: Payer: Self-pay | Admitting: Nurse Practitioner

## 2023-08-23 DIAGNOSIS — E782 Mixed hyperlipidemia: Secondary | ICD-10-CM

## 2023-09-27 ENCOUNTER — Other Ambulatory Visit: Payer: Self-pay | Admitting: Nurse Practitioner

## 2023-10-11 ENCOUNTER — Other Ambulatory Visit: Payer: Self-pay | Admitting: Nurse Practitioner

## 2023-10-15 ENCOUNTER — Encounter: Payer: Self-pay | Admitting: Nurse Practitioner

## 2023-10-15 ENCOUNTER — Ambulatory Visit (INDEPENDENT_AMBULATORY_CARE_PROVIDER_SITE_OTHER): Payer: Self-pay | Admitting: Nurse Practitioner

## 2023-10-15 VITALS — BP 144/96 | HR 96 | Temp 98.5°F | Ht 61.0 in | Wt 303.0 lb

## 2023-10-15 DIAGNOSIS — K219 Gastro-esophageal reflux disease without esophagitis: Secondary | ICD-10-CM | POA: Diagnosis not present

## 2023-10-15 DIAGNOSIS — E782 Mixed hyperlipidemia: Secondary | ICD-10-CM | POA: Diagnosis not present

## 2023-10-15 DIAGNOSIS — G43009 Migraine without aura, not intractable, without status migrainosus: Secondary | ICD-10-CM

## 2023-10-15 DIAGNOSIS — I10 Essential (primary) hypertension: Secondary | ICD-10-CM | POA: Diagnosis not present

## 2023-10-15 DIAGNOSIS — F5101 Primary insomnia: Secondary | ICD-10-CM

## 2023-10-15 DIAGNOSIS — G2581 Restless legs syndrome: Secondary | ICD-10-CM

## 2023-10-15 DIAGNOSIS — R3915 Urgency of urination: Secondary | ICD-10-CM

## 2023-10-15 DIAGNOSIS — F3342 Major depressive disorder, recurrent, in full remission: Secondary | ICD-10-CM

## 2023-10-15 DIAGNOSIS — M797 Fibromyalgia: Secondary | ICD-10-CM

## 2023-10-15 MED ORDER — PRAMIPEXOLE DIHYDROCHLORIDE 1 MG PO TABS: 1.0000 mg | ORAL_TABLET | Freq: Two times a day (BID) | ORAL | 1 refills | Status: DC

## 2023-10-15 MED ORDER — LISINOPRIL-HYDROCHLOROTHIAZIDE 20-12.5 MG PO TABS: 2.0000 | ORAL_TABLET | Freq: Every day | ORAL | 1 refills | Status: DC

## 2023-10-15 MED ORDER — DULOXETINE HCL 60 MG PO CPEP: 60.0000 mg | ORAL_CAPSULE | Freq: Every day | ORAL | 1 refills | Status: DC

## 2023-10-15 MED ORDER — TOPIRAMATE 50 MG PO TABS: 50.0000 mg | ORAL_TABLET | Freq: Three times a day (TID) | ORAL | 1 refills | Status: DC

## 2023-10-15 MED ORDER — ROSUVASTATIN CALCIUM 20 MG PO TABS: 20.0000 mg | ORAL_TABLET | Freq: Every day | ORAL | 1 refills | Status: DC

## 2023-10-15 MED ORDER — GABAPENTIN 300 MG PO CAPS: 300.0000 mg | ORAL_CAPSULE | Freq: Three times a day (TID) | ORAL | 1 refills | Status: DC

## 2023-10-15 MED ORDER — OMEPRAZOLE 40 MG PO CPDR: 40.0000 mg | DELAYED_RELEASE_CAPSULE | Freq: Every day | ORAL | 1 refills | Status: DC

## 2023-10-15 MED ORDER — TRAMADOL HCL 50 MG PO TABS: 50.0000 mg | ORAL_TABLET | Freq: Three times a day (TID) | ORAL | 0 refills | Status: DC | PRN

## 2023-10-15 MED ORDER — FESOTERODINE FUMARATE ER 8 MG PO TB24: 8.0000 mg | ORAL_TABLET | Freq: Every day | ORAL | 1 refills | Status: DC

## 2023-10-15 MED ORDER — ZOLPIDEM TARTRATE 10 MG PO TABS: 10.0000 mg | ORAL_TABLET | Freq: Every day | ORAL | 5 refills | Status: DC

## 2023-10-15 MED ORDER — PANTOPRAZOLE SODIUM 40 MG PO TBEC: 40.0000 mg | DELAYED_RELEASE_TABLET | Freq: Every day | ORAL | 1 refills | Status: DC

## 2023-10-15 MED ORDER — AMLODIPINE BESYLATE 5 MG PO TABS: 5.0000 mg | ORAL_TABLET | Freq: Every day | ORAL | 1 refills | Status: DC

## 2023-10-15 NOTE — Patient Instructions (Signed)
 Insomnia Insomnia is a sleep disorder that makes it difficult to fall asleep or stay asleep. Insomnia can cause fatigue, low energy, difficulty concentrating, mood swings, and poor performance at work or school. There are three different ways to classify insomnia: Difficulty falling asleep. Difficulty staying asleep. Waking up too early in the morning. Any type of insomnia can be long-term (chronic) or short-term (acute). Both are common. Short-term insomnia usually lasts for 3 months or less. Chronic insomnia occurs at least three times a week for longer than 3 months. What are the causes? Insomnia may be caused by another condition, situation, or substance, such as: Having certain mental health conditions, such as anxiety and depression. Using caffeine, alcohol, tobacco, or drugs. Having gastrointestinal conditions, such as gastroesophageal reflux disease (GERD). Having certain medical conditions. These include: Asthma. Alzheimer's disease. Stroke. Chronic pain. An overactive thyroid gland (hyperthyroidism). Other sleep disorders, such as restless legs syndrome and sleep apnea. Menopause. Sometimes, the cause of insomnia may not be known. What increases the risk? Risk factors for insomnia include: Gender. Females are affected more often than males. Age. Insomnia is more common as people get older. Stress and certain medical and mental health conditions. Lack of exercise. Having an irregular work schedule. This may include working night shifts and traveling between different time zones. What are the signs or symptoms? If you have insomnia, the main symptom is having trouble falling asleep or having trouble staying asleep. This may lead to other symptoms, such as: Feeling tired or having low energy. Feeling nervous about going to sleep. Not feeling rested in the morning. Having trouble concentrating. Feeling irritable, anxious, or depressed. How is this diagnosed? This condition  may be diagnosed based on: Your symptoms and medical history. Your health care provider may ask about: Your sleep habits. Any medical conditions you have. Your mental health. A physical exam. How is this treated? Treatment for insomnia depends on the cause. Treatment may focus on treating an underlying condition that is causing the insomnia. Treatment may also include: Medicines to help you sleep. Counseling or therapy. Lifestyle adjustments to help you sleep better. Follow these instructions at home: Eating and drinking  Limit or avoid alcohol, caffeinated beverages, and products that contain nicotine and tobacco, especially close to bedtime. These can disrupt your sleep. Do not eat a large meal or eat spicy foods right before bedtime. This can lead to digestive discomfort that can make it hard for you to sleep. Sleep habits  Keep a sleep diary to help you and your health care provider figure out what could be causing your insomnia. Write down: When you sleep. When you wake up during the night. How well you sleep and how rested you feel the next day. Any side effects of medicines you are taking. What you eat and drink. Make your bedroom a dark, comfortable place where it is easy to fall asleep. Put up shades or blackout curtains to block light from outside. Use a white noise machine to block noise. Keep the temperature cool. Limit screen use before bedtime. This includes: Not watching TV. Not using your smartphone, tablet, or computer. Stick to a routine that includes going to bed and waking up at the same times every day and night. This can help you fall asleep faster. Consider making a quiet activity, such as reading, part of your nighttime routine. Try to avoid taking naps during the day so that you sleep better at night. Get out of bed if you are still awake after  15 minutes of trying to sleep. Keep the lights down, but try reading or doing a quiet activity. When you feel  sleepy, go back to bed. General instructions Take over-the-counter and prescription medicines only as told by your health care provider. Exercise regularly as told by your health care provider. However, avoid exercising in the hours right before bedtime. Use relaxation techniques to manage stress. Ask your health care provider to suggest some techniques that may work well for you. These may include: Breathing exercises. Routines to release muscle tension. Visualizing peaceful scenes. Make sure that you drive carefully. Do not drive if you feel very sleepy. Keep all follow-up visits. This is important. Contact a health care provider if: You are tired throughout the day. You have trouble in your daily routine due to sleepiness. You continue to have sleep problems, or your sleep problems get worse. Get help right away if: You have thoughts about hurting yourself or someone else. Get help right away if you feel like you may hurt yourself or others, or have thoughts about taking your own life. Go to your nearest emergency room or: Call 911. Call the National Suicide Prevention Lifeline at (906)021-1611 or 988. This is open 24 hours a day. Text the Crisis Text Line at 325-069-2793. Summary Insomnia is a sleep disorder that makes it difficult to fall asleep or stay asleep. Insomnia can be long-term (chronic) or short-term (acute). Treatment for insomnia depends on the cause. Treatment may focus on treating an underlying condition that is causing the insomnia. Keep a sleep diary to help you and your health care provider figure out what could be causing your insomnia. This information is not intended to replace advice given to you by your health care provider. Make sure you discuss any questions you have with your health care provider. Document Revised: 04/28/2021 Document Reviewed: 04/28/2021 Elsevier Patient Education  2024 [employer].

## 2023-10-15 NOTE — Addendum Note (Signed)
 Addended by: Kivon Aprea, MARY-MARGARET on: 10/15/2023 02:00 PM   Modules accepted: Orders

## 2023-10-15 NOTE — Progress Notes (Signed)
 Subjective:    Patient ID: Marie Carter, female    DOB: 1960-11-26, 63 y.o.   MRN: [id number]   Chief Complaint: medical management of chronic issues     HPI:  Marie Carter is a 63 y.o. who identifies as a female who was assigned female at birth.   Social history: Lives with: husband Work history: disability   Comes in today for follow up of the following chronic medical issues:  1. Primary hypertension No c/o chest pain, sob or headache. At home patient blood pressure is running around low 100's systolic. BP Readings from Last 3 Encounters:  10/15/23 (!) 144/96  07/22/23 (!) 145/89  04/26/23 (!) 148/78       2. Mixed hyperlipidemia Does not watch diet and does no dedicated exercise. Is on crestor  Lab Results  Component Value Date   CHOL 245 (H) 10/20/2022   HDL 65 10/20/2022   LDLCALC 161 (H) 10/20/2022   TRIG 107 10/20/2022   CHOLHDL 3.8 10/20/2022     3. Gastroesophageal reflux disease without esophagitis Takes protonix  daily and is doing well  4. Migraine without aura and without status migrainosus, not intractable Has occasionally. Is on topamax  for prevention  5. Recurrent major depressive disorder, in full remission (HCC) Currently not on any anti depressants    10/15/2023    1:37 PM 04/26/2023    3:05 PM 10/20/2022    2:00 PM  Depression screen PHQ 2/9  Decreased Interest 0 1 0  Down, Depressed, Hopeless 1 1 1   PHQ - 2 Score 1 2 1   Altered sleeping 2 0 0  Tired, decreased energy 1 0 1  Change in appetite 1 1 0  Feeling bad or failure about yourself  2 0 0  Trouble concentrating 0 0 0  Moving slowly or fidgety/restless 0 0 0  Suicidal thoughts 0 0 0  PHQ-9 Score 7 3 2   Difficult doing work/chores Somewhat difficult Somewhat difficult Not difficult at all      6. Fibromyalgia Pain assessment: Cause of pain- fibromyalgia Pain location- varies from day to day Pain on scale of 1-10- 6-7/10 Frequency- daily What increases pain-to much  activity What makes pain Better-rest helps Effects on ADL - none Any change in general medical condition-no  Current opioids rx- ultram  50mg  TID # meds rx- 90 Effectiveness of current meds-helps Adverse reactions from pain meds-none Morphine equivalent- 15MME  Pill count performed-No Last drug screen - 07/24/22 ( high risk q51m, moderate risk q33m, low risk yearly ) Urine drug screen today- No Was the N[facility] reviewed- yes  If yes were their any concerning findings? - no   Overdose risk: 1   Pain contract signed on:04/29/22   7. RLS (restless legs syndrome) Is on mirapex  which helps  8. Primary insomnia Ambien  to sleep- cannot sleep without meds  9. Severe obesity (BMI >= 40) (HCC) Weight is down 2lbs  Wt Readings from Last 3 Encounters:  10/15/23 (!) 303 lb (137.4 kg)  07/22/23 (!) 305 lb 9.6 oz (138.6 kg)  04/26/23 (!) 307 lb (139.3 kg)   BMI Readings from Last 3 Encounters:  10/15/23 57.25 kg/m  07/22/23 57.74 kg/m  04/26/23 58.01 kg/m      New complaints: None  today  Allergies  Allergen Reactions   Bee Pollen    Butrans [Buprenorphine] Nausea And Vomiting    patch   Coconut (Cocos Nucifera)    Mucinex  [Guaifenesin  Er] Rash   Outpatient Encounter Medications as of 10/15/2023  Medication  Sig   albuterol  (VENTOLIN  HFA) 108 (90 Base) MCG/ACT inhaler USE 2 INHALATIONS EVERY 6 HOURS AS NEEDED FOR WHEEZING   amLODipine  (NORVASC ) 5 MG tablet Take 1 tablet (5 mg total) by mouth daily.   baclofen  (LIORESAL ) 10 MG tablet Take 1 tablet (10 mg total) by mouth 3 (three) times daily.   DULoxetine  (CYMBALTA ) 60 MG capsule Take 1 capsule (60 mg total) by mouth daily.   EPINEPHrine  0.3 mg/0.3 mL IJ SOAJ injection Inject 0.3 mLs (0.3 mg total) into the muscle once.   fesoterodine  (TOVIAZ ) 8 MG TB24 tablet TAKE 1 TABLET DAILY   gabapentin  (NEURONTIN ) 300 MG capsule Take 1 capsule (300 mg total) by mouth 3 (three) times daily.   lisinopril -hydrochlorothiazide   (ZESTORETIC ) 20-12.5 MG tablet Take 2 tablets by mouth daily.   medroxyPROGESTERone  (PROVERA ) 10 MG tablet TAKE 1 TABLET DAILY   omeprazole  (PRILOSEC) 40 MG capsule Take 1 capsule (40 mg total) by mouth daily.   pantoprazole  (PROTONIX ) 40 MG tablet Take 1 tablet (40 mg total) by mouth daily.   pramipexole  (MIRAPEX ) 1 MG tablet Take 1 tablet (1 mg total) by mouth 2 (two) times daily.   rosuvastatin  (CRESTOR ) 20 MG tablet TAKE 1 TABLET DAILY   topiramate  (TOPAMAX ) 50 MG tablet Take 1 tablet (50 mg total) by mouth 3 (three) times daily.   traMADol  (ULTRAM ) 50 MG tablet Take 1 tablet (50 mg total) by mouth every 8 (eight) hours as needed.   traMADol  (ULTRAM ) 50 MG tablet Take 1 tablet (50 mg total) by mouth every 8 (eight) hours as needed.   traMADol  (ULTRAM ) 50 MG tablet Take 1 tablet (50 mg total) by mouth every 8 (eight) hours as needed.   zolpidem  (AMBIEN ) 10 MG tablet Take 1 tablet (10 mg total) by mouth at bedtime.   No facility-administered encounter medications on file as of 10/15/2023.    Past Surgical History:  Procedure Laterality Date   BREAST BIOPSY Left    x 2 / 10-12 years ago / benign   BREAST SURGERY     biopsy times 2   JOINT REPLACEMENT Right    JOINT REPLACEMENT Left    SPINE SURGERY     cervical spine    Family History  Problem Relation Age of Onset   Diabetes Mother    Heart attack Father 19   Diabetes Father    Breast cancer Maternal Grandmother    Stroke Maternal Grandmother    Colon cancer Other        family history          Review of Systems  Constitutional:  Negative for diaphoresis.  Eyes:  Negative for pain.  Respiratory:  Negative for shortness of breath.   Cardiovascular:  Negative for chest pain, palpitations and leg swelling.  Gastrointestinal:  Negative for abdominal pain.  Endocrine: Negative for polydipsia.  Skin:  Negative for rash.  Neurological:  Negative for dizziness, weakness and headaches.  Hematological:  Does not  bruise/bleed easily.  All other systems reviewed and are negative.      Objective:   Physical Exam Vitals and nursing note reviewed.  Constitutional:      General: She is not in acute distress.    Appearance: Normal appearance. She is well-developed.  HENT:     Head: Normocephalic.     Right Ear: Tympanic membrane normal.     Left Ear: Tympanic membrane normal.     Nose: Nose normal.     Mouth/Throat:     Mouth:  Mucous membranes are moist.  Eyes:     Pupils: Pupils are equal, round, and reactive to light.  Neck:     Vascular: No carotid bruit or JVD.  Cardiovascular:     Rate and Rhythm: Normal rate and regular rhythm.     Heart sounds: Normal heart sounds.  Pulmonary:     Effort: Pulmonary effort is normal. No respiratory distress.     Breath sounds: Normal breath sounds. No wheezing or rales.  Chest:     Chest wall: No tenderness.  Abdominal:     General: Bowel sounds are normal. There is no distension or abdominal bruit.     Palpations: Abdomen is soft. There is no hepatomegaly, splenomegaly, mass or pulsatile mass.     Tenderness: There is no abdominal tenderness.  Musculoskeletal:        General: Normal range of motion.     Cervical back: Normal range of motion and neck supple.     Comments: Walks with walker  Lymphadenopathy:     Cervical: No cervical adenopathy.  Skin:    General: Skin is warm and dry.  Neurological:     Mental Status: She is alert and oriented to person, place, and time.     Deep Tendon Reflexes: Reflexes are normal and symmetric.  Psychiatric:        Behavior: Behavior normal.        Thought Content: Thought content normal.        Judgment: Judgment normal.     LMP 09/13/2012   BP (!) 144/96   Pulse 96   Temp 98.5 F (36.9 C) (Temporal)   Ht 5\' 1"  (1.549 m)   Wt (!) 303 lb (137.4 kg)   LMP 09/13/2012   SpO2 94%   BMI 57.25 kg/m         Assessment & Plan:   Lagretta Colligan comes in today with chief complaint of medical  management of chronic issues    Diagnosis and orders addressed:  1. Primary hypertension Low sodium diet - CBC with Differential/Platelet - CMP14+EGFR - amLODipine  (NORVASC ) 5 MG tablet; Take 1 tablet (5 mg total) by mouth daily.  Dispense: 90 tablet; Refill: 1 - lisinopril -hydrochlorothiazide  (ZESTORETIC ) 20-12.5 MG tablet; Take 2 tablets by mouth daily.  Dispense: 180 tablet; Refill: 1  2. Mixed hyperlipidemia Low fat diet - Lipid panel - rosuvastatin  (CRESTOR ) 20 MG tablet; TAKE 1 TABLET DAILY.  Dispense: 90 tablet; Refill: 0  3. Gastroesophageal reflux disease without esophagitis Avoid spicy foods Do not eat 2 hours prior to bedtime  - omeprazole  (PRILOSEC) 40 MG capsule; Take 1 capsule (40 mg total) by mouth daily.  Dispense: 90 capsule; Refill: 1 - pantoprazole  (PROTONIX ) 40 MG tablet; Take 1 tablet (40 mg total) by mouth daily.  Dispense: 90 tablet; Refill: 1  4. Migraine without aura and without status migrainosus, not intractable Avoid caffeine - topiramate  (TOPAMAX ) 50 MG tablet; Take 1 tablet (50 mg total) by mouth 3 (three) times daily.  Dispense: 270 tablet; Refill: 1  5. Recurrent major depressive disorder, in full remission [facility]) Stress management  6. Fibromyalgia Keep warm - ToxASSURE Select 13 (MW), Urine - baclofen  (LIORESAL ) 10 MG tablet; Take 1 tablet (10 mg total) by mouth 3 (three) times daily.  Dispense: 90 tablet; Refill: 11 - DULoxetine  (CYMBALTA ) 60 MG capsule; Take 1 capsule (60 mg total) by mouth daily.  Dispense: 90 capsule; Refill: 1 - gabapentin  (NEURONTIN ) 300 MG capsule; Take 1 capsule (300 mg total) by mouth  3 (three) times daily.  Dispense: 540 capsule; Refill: 1 - traMADol  (ULTRAM ) 50 MG tablet; Take 1 tablet (50 mg total) by mouth every 8 (eight) hours as needed.  Dispense: 90 tablet; Refill: 0 - traMADol  (ULTRAM ) 50 MG tablet; Take 1 tablet (50 mg total) by mouth every 8 (eight) hours as needed.  Dispense: 90 tablet; Refill: 0 - traMADol   (ULTRAM ) 50 MG tablet; Take 1 tablet (50 mg total) by mouth every 8 (eight) hours as needed.  Dispense: 90 tablet; Refill: 0  7. RLS (restless legs syndrome) - pramipexole  (MIRAPEX ) 1 MG tablet; Take 1 tablet (1 mg total) by mouth 2 (two) times daily.  Dispense: 180 tablet; Refill: 1  8. Primary insomnia Bedtime routine - zolpidem  (AMBIEN ) 10 MG tablet; Take 1 tablet (10 mg total) by mouth at bedtime.  Dispense: 30 tablet; Refill: 5  9. Severe obesity (BMI >= 40) (HCC) Discussed diet and exercise for person with BMI >25 Will recheck weight in 3-6 months    Labs pending Health Maintenance reviewed Diet and exercise encouraged  Follow up plan: 3 months   Mary-Margaret Gaylyn Keas, FNP

## 2023-10-16 LAB — LIPID PANEL
Chol/HDL Ratio: 5 ratio — ABNORMAL HIGH (ref 0.0–4.4)
Cholesterol, Total: 254 mg/dL — ABNORMAL HIGH (ref 100–199)
HDL: 51 mg/dL (ref >39)
LDL Chol Calc (NIH): 179 mg/dL — ABNORMAL HIGH (ref 0–99)
Triglycerides: 133 mg/dL (ref 0–149)
VLDL Cholesterol Cal: 24 mg/dL (ref 5–40)

## 2023-10-16 LAB — CBC WITH DIFFERENTIAL/PLATELET
Basophils Absolute: 0.1 10*3/uL (ref 0.0–0.2)
Basos: 1 %
EOS (ABSOLUTE): 0.1 10*3/uL (ref 0.0–0.4)
Eos: 2 %
Hematocrit: 46.3 % (ref 34.0–46.6)
Hemoglobin: 14.9 g/dL (ref 11.1–15.9)
Immature Grans (Abs): 0 10*3/uL (ref 0.0–0.1)
Immature Granulocytes: 1 %
Lymphocytes Absolute: 2.6 10*3/uL (ref 0.7–3.1)
Lymphs: 40 %
MCH: 30.2 pg (ref 26.6–33.0)
MCHC: 32.2 g/dL (ref 31.5–35.7)
MCV: 94 fL (ref 79–97)
Monocytes Absolute: 0.4 10*3/uL (ref 0.1–0.9)
Monocytes: 6 %
Neutrophils Absolute: 3.2 10*3/uL (ref 1.4–7.0)
Neutrophils: 50 %
Platelets: 353 10*3/uL (ref 150–450)
RBC: 4.94 x10E6/uL (ref 3.77–5.28)
RDW: 12.8 % (ref 11.7–15.4)
WBC: 6.4 10*3/uL (ref 3.4–10.8)

## 2023-10-16 LAB — CMP14+EGFR
ALT: 20 IU/L (ref 0–32)
AST: 15 IU/L (ref 0–40)
Albumin: 4.3 g/dL (ref 3.9–4.9)
Alkaline Phosphatase: 115 IU/L (ref 44–121)
BUN/Creatinine Ratio: 20 (ref 12–28)
BUN: 26 mg/dL (ref 8–27)
Bilirubin Total: 0.4 mg/dL (ref 0.0–1.2)
CO2: 22 mmol/L (ref 20–29)
Calcium: 9.8 mg/dL (ref 8.7–10.3)
Chloride: 102 mmol/L (ref 96–106)
Creatinine, Ser: 1.28 mg/dL — ABNORMAL HIGH (ref 0.57–1.00)
Globulin, Total: 3.2 g/dL (ref 1.5–4.5)
Glucose: 89 mg/dL (ref 70–99)
Potassium: 4.5 mmol/L (ref 3.5–5.2)
Sodium: 141 mmol/L (ref 134–144)
Total Protein: 7.5 g/dL (ref 6.0–8.5)
eGFR: 47 mL/min/{1.73_m2} — ABNORMAL LOW (ref >59)

## 2023-10-18 ENCOUNTER — Ambulatory Visit: Payer: Self-pay | Admitting: Nurse Practitioner

## 2023-10-27 ENCOUNTER — Telehealth: Payer: Self-pay | Admitting: Family Medicine

## 2023-10-27 NOTE — Telephone Encounter (Signed)
 Copied from CRM [id number]. Topic: Clinical - Prescription Issue >> Oct 27, 2023 12:56 PM DeAngela L wrote: Reason for CRM: Patient calling to inform Dr office that she [facility] her [facility] refill cause there is a concern of someone stealing her prescriptions out of her mailbox when delivered from home delivery, [facility] cause the patient states the police explained they couldnt help her with anything once they steal medication from her mailbox and then the patient can't get any help with stopping them from taking it   traMADol  (ULTRAM ) 50 MG tablet (Starting on 11/18/2023)   zolpidem  (AMBIEN ) 10 MG tablet  Patient number for any additional questions [phone] [facility])  [id number] [place] [place] - [address] [place] [address] Phone: [phone] Fax: [phone]

## 2023-10-28 NOTE — Telephone Encounter (Signed)
 Patient does not want rxs filled early. She just wants to make sure that next time they are filled that [facility] are [facility] of [facility] because she thinks someone is stealing her meds

## 2023-10-28 NOTE — Telephone Encounter (Signed)
 Insurance will n ot allow up to fill either of these early- need to [employer] and see what they say about filling early

## 2023-11-22 ENCOUNTER — Other Ambulatory Visit: Payer: Self-pay | Admitting: Nurse Practitioner

## 2023-11-22 ENCOUNTER — Telehealth: Payer: Self-pay

## 2023-11-22 DIAGNOSIS — F5101 Primary insomnia: Secondary | ICD-10-CM

## 2023-11-22 NOTE — Telephone Encounter (Signed)
 Made pt aware that MMM is out of the office today and should return tomorrow. Pt [facility] not [facility] for Ambien . Pt has enough Tramadol .  [facility] #30 Ambien  #90 Tramadol .  Please [facility] [place]

## 2023-11-22 NOTE — Telephone Encounter (Signed)
 Copied from CRM [id number]. Topic: Clinical - Prescription Issue >> Nov 22, 2023 10:46 AM Marie Carter wrote: Reason for CRM: Patient states her rpescriptions for traMADol  (ULTRAM ) 50 MG tablet and zolpidem  (AMBIEN ) 10 MG tablet were sent to the incorrect pharmacy. Would like them to be sent to:  [id number] [place] [place] - [address] [place] [address] Phone: [phone] Fax: [phone]  Patient can be reached at [phone]

## 2023-12-22 ENCOUNTER — Other Ambulatory Visit: Payer: Self-pay | Admitting: Nurse Practitioner

## 2023-12-22 DIAGNOSIS — M797 Fibromyalgia: Secondary | ICD-10-CM

## 2023-12-22 NOTE — Telephone Encounter (Unsigned)
 Copied from CRM [id number]. Topic: Clinical - Medication Refill >> Dec 22, 2023  1:08 PM Zebedee SAUNDERS wrote: Medication: traMADol  (ULTRAM ) 50 MG tablet  Has the patient contacted their pharmacy? Yes (Agent: If no, request that the patient contact the pharmacy for the refill. If patient does not wish to contact the pharmacy document the reason why and proceed with request.) (Agent: If yes, when and what did the pharmacy advise?)  This is the patient's preferred pharmacy:   [id number] [place] [place] - [address] [place] [address] Phone: [phone] Fax: [phone]  Is this the correct pharmacy for this prescription? Yes If no, delete pharmacy and type the correct one.   Has the prescription been filled recently? Yes  Is the patient out of the medication? Yes  Has the patient been seen for an appointment in the last year OR does the patient have an upcoming appointment? Yes  Can we respond through MyChart? Yes  Agent: Please be advised that Rx refills may take up to 3 business days. We ask that you follow-up with your pharmacy.

## 2023-12-23 ENCOUNTER — Other Ambulatory Visit: Payer: Self-pay | Admitting: Nurse Practitioner

## 2023-12-23 ENCOUNTER — Telehealth: Payer: Self-pay

## 2023-12-23 DIAGNOSIS — M797 Fibromyalgia: Secondary | ICD-10-CM

## 2023-12-23 MED ORDER — TRAMADOL HCL 50 MG PO TABS: 50.0000 mg | ORAL_TABLET | Freq: Three times a day (TID) | ORAL | 0 refills | Status: DC | PRN

## 2023-12-23 NOTE — Telephone Encounter (Signed)
 The [facility]  (ULTRAM ) 50 MG tablet [[id number]]    Order Details Dose: 50 mg Route: Oral Frequency: Every 8 hours PRN  Dispense Quantity: 90 tablet (30 day supply) Refills: 0   Duration: 30 days Dispense As Written: No        Sig: Take 1 tablet (50 mg total) by mouth every 8 (eight) hours as needed.       Start Date: 12/18/23 End Date: 01/17/24  Written Date: 10/15/23 Expiration Date: 04/12/24     Associated Diagnoses: Fibromyalgia [M79.7]  Original Order: traMADol  (ULTRAM ) 50 MG tablet [[id number]]

## 2023-12-23 NOTE — Telephone Encounter (Signed)
 [facility], make sure the prescriptions at the mail-order pharmacy get canceled.

## 2023-12-23 NOTE — Telephone Encounter (Signed)
 Patient aware.

## 2023-12-23 NOTE — Telephone Encounter (Signed)
 Last OV 10/15/23

## 2023-12-23 NOTE — Telephone Encounter (Signed)
 Duplicate request, pt aware [facility]

## 2023-12-23 NOTE — Telephone Encounter (Signed)
 Spoke with mail order they never got it

## 2023-12-23 NOTE — Addendum Note (Signed)
 Addended by: MARYANNE CHEW on: 12/23/2023 11:51 AM   Modules accepted: Orders

## 2023-12-23 NOTE — Telephone Encounter (Signed)
 Copied from CRM [id number]. Topic: Clinical - Medication Refill >> Dec 23, 2023 11:40 AM Zebedee SAUNDERS wrote: Medication: traMADol  (ULTRAM ) 50 MG tablet  Has the patient contacted their pharmacy? Yes (Agent: If no, request that the patient contact the pharmacy for the refill. If patient does not wish to contact the pharmacy document the reason why and proceed with request.) (Agent: If yes, when and what did the pharmacy advise?)  This is the patient's preferred pharmacy:   [id number] [place] [place] - [address] [place] [address] Phone: [phone] Fax: [phone]  Is this the correct pharmacy for this prescription? Yes If no, delete pharmacy and type the correct one.   Has the prescription been filled recently? Yes  Is the patient out of the medication? Yes  Has the patient been seen for an appointment in the last year OR does the patient have an upcoming appointment? Yes  Can we respond through MyChart? Yes  Agent: Please be advised that Rx refills may take up to 3 business days. We ask that you follow-up with your pharmacy.

## 2023-12-23 NOTE — Telephone Encounter (Signed)
 Copied from CRM [id number]. Topic: Clinical - Prescription Issue >> Dec 23, 2023 11:10 AM Wess RAMAN wrote: Reason for CRM: Patient states that her TRAMADOL  HCL 50 MG TABLET is still not ready at the pharmacy due to the pharmacy not receiving a new prescription dated 12/18/23  Callback #: [phone]  [facility]acy[id number] [place], [place] - [address] [place] [address] Phone: [phone] Fax: [phone] Hours: Not open 24 hours

## 2023-12-27 ENCOUNTER — Other Ambulatory Visit: Payer: Self-pay | Admitting: Nurse Practitioner

## 2024-01-10 ENCOUNTER — Other Ambulatory Visit: Payer: Self-pay | Admitting: Nurse Practitioner

## 2024-01-17 NOTE — Progress Notes (Unsigned)
 Subjective:    Patient ID: Marie Carter, female    DOB: Aug 27, 1960, 63 y.o.   MRN: [id number]   Chief Complaint: pain management  HPI  Pain assessment: Cause of pain- fibromyalgia Pain location- varies from day to day Pain on scale of 1-10- 6-7/10 Frequency- daily What increases pain-to much activity What makes pain Better-rest seems to help her Effects on ADL - none Any change in general medical condition-none  Current opioids rx- ultram  50mg  TID # meds rx- 90 Effectiveness of current meds-helps Adverse reactions from pain meds-none Morphine equivalent- 15 MME  Pill count performed-No Last drug screen - 04/14/22 ( high risk q36m, moderate risk q27m, low risk yearly ) Urine drug screen today- No Was the [facility] reviewed- yes  If yes were their any concerning findings? - no   Overdose risk: 1   Pain contract signed on:05/04/23 Last drug screen: 07/24/22  Patient Active Problem List   Diagnosis Date Noted   Migraine without aura and without status migrainosus, not intractable 11/05/2014   Severe obesity (BMI >= 40) (HCC) 07/23/2014   RLS (restless legs syndrome) 01/15/2014   GERD (gastroesophageal reflux disease) 10/05/2012   Hyperlipidemia 10/05/2012   Fibromyalgia 10/05/2012   Depression 10/05/2012   Asthma, chronic 10/05/2012   Hypertension 10/05/2012   Insomnia 10/05/2012       Review of Systems  Constitutional:  Negative for diaphoresis.  Eyes:  Negative for pain.  Respiratory:  Negative for shortness of breath.   Cardiovascular:  Negative for chest pain, palpitations and leg swelling.  Gastrointestinal:  Negative for abdominal pain.  Endocrine: Negative for polydipsia.  Skin:  Negative for rash.  Neurological:  Negative for dizziness, weakness and headaches.  Hematological:  Does not bruise/bleed easily.  All other systems reviewed and are negative.      Objective:   Physical Exam Vitals and nursing note reviewed.  Constitutional:       General: She is not in acute distress.    Appearance: Normal appearance. She is well-developed.  Neck:     Vascular: No carotid bruit or JVD.  Cardiovascular:     Rate and Rhythm: Normal rate and regular rhythm.     Heart sounds: Normal heart sounds.  Pulmonary:     Effort: Pulmonary effort is normal. No respiratory distress.     Breath sounds: Normal breath sounds. No wheezing or rales.  Chest:     Chest wall: No tenderness.  Abdominal:     General: Bowel sounds are normal. There is no distension or abdominal bruit.     Palpations: Abdomen is soft. There is no hepatomegaly, splenomegaly, mass or pulsatile mass.     Tenderness: There is no abdominal tenderness.  Musculoskeletal:        General: Normal range of motion.     Cervical back: Normal range of motion and neck supple.  Lymphadenopathy:     Cervical: No cervical adenopathy.  Skin:    General: Skin is warm and dry.  Neurological:     Mental Status: She is alert and oriented to person, place, and time.     Deep Tendon Reflexes: Reflexes are normal and symmetric.  Psychiatric:        Behavior: Behavior normal.        Thought Content: Thought content normal.        Judgment: Judgment normal.    LMP 09/13/2012    LMP 09/13/2012        Assessment & Plan:   Zakiyyah Savannah in  today with chief complaint of No chief complaint on file.   1. Fibromyalgia Keep muscle warm - traMADol  (ULTRAM ) 50 MG tablet; Take 1 tablet (50 mg total) by mouth every 8 (eight) hours as needed.  Dispense: 90 tablet; Refill: 0 - traMADol  (ULTRAM ) 50 MG tablet; Take 1 tablet (50 mg total) by mouth every 8 (eight) hours as needed.  Dispense: 90 tablet; Refill: 0 - traMADol  (ULTRAM ) 50 MG tablet; Take 1 tablet (50 mg total) by mouth every 8 (eight) hours as needed.  Dispense: 90 tablet; Refill: 0    The above assessment and management plan was discussed with the patient. The patient verbalized understanding of and has agreed to the management  plan. Patient is aware to call the clinic if symptoms persist or worsen. Patient is aware when to return to the clinic for a follow-up visit. Patient educated on when it is appropriate to go to the emergency department.   Mary-Margaret Gladis, FNP

## 2024-01-18 ENCOUNTER — Encounter: Payer: Self-pay | Admitting: Nurse Practitioner

## 2024-01-18 ENCOUNTER — Ambulatory Visit (INDEPENDENT_AMBULATORY_CARE_PROVIDER_SITE_OTHER): Admitting: Nurse Practitioner

## 2024-01-18 DIAGNOSIS — M797 Fibromyalgia: Secondary | ICD-10-CM

## 2024-01-18 DIAGNOSIS — F5101 Primary insomnia: Secondary | ICD-10-CM

## 2024-01-18 MED ORDER — ZOLPIDEM TARTRATE 10 MG PO TABS: 10.0000 mg | ORAL_TABLET | Freq: Every day | ORAL | 2 refills | Status: DC

## 2024-01-18 MED ORDER — TRAMADOL HCL 50 MG PO TABS: 50.0000 mg | ORAL_TABLET | Freq: Three times a day (TID) | ORAL | 0 refills | Status: DC | PRN

## 2024-01-18 NOTE — Patient Instructions (Signed)
Myofascial Pain Syndrome and Fibromyalgia Myofascial pain syndrome and fibromyalgia are both pain disorders. You may feel this pain mainly in your muscles. Myofascial pain syndrome: Always has tender points in the muscles that will cause pain when pressed (trigger points). The pain may come and go. Usually affects your neck, upper back, and shoulder areas. The pain often moves into your arms and hands. Fibromyalgia: Has muscle pains and tenderness that come and go. Is often associated with tiredness (fatigue) and sleep problems. Has trigger points. Tends to be long-lasting (chronic), but is not life-threatening. Fibromyalgia and myofascial pain syndrome are not the same. However, they often occur together. If you have both conditions, each can make the other worse. Both are common and can cause enough pain and fatigue to make day-to-day activities difficult. Both can be hard to diagnose because their symptoms are common in many other conditions. What are the causes? The exact causes of these conditions are not known. What increases the risk? You are more likely to develop either of these conditions if: You have a family history of the condition. You are female. You have certain triggers, such as: Spine disorders. An injury (trauma) or other physical stressors. Being under a lot of stress. Medical conditions such as osteoarthritis, rheumatoid arthritis, or lupus. What are the signs or symptoms? Fibromyalgia The main symptom of fibromyalgia is widespread pain and tenderness in your muscles. Pain is sometimes described as stabbing, shooting, or burning. You may also have: Tingling or numbness. Sleep problems and fatigue. Problems with attention and concentration (fibro fog). Other symptoms may include: Bowel and bladder problems. Headaches. Vision problems. Sensitivity to odors and noises. Depression or mood changes. Painful menstrual periods (dysmenorrhea). Dry skin or eyes. These  symptoms can vary over time. Myofascial pain syndrome Symptoms of myofascial pain syndrome include: Tight, ropy bands of muscle. Uncomfortable sensations in muscle areas. These may include aching, cramping, burning, numbness, tingling, and weakness. Difficulty moving certain parts of the body freely (poor range of motion). How is this diagnosed? This condition may be diagnosed by your symptoms and medical history. You will also have a physical exam. In general: Fibromyalgia is diagnosed if you have pain, fatigue, and other symptoms for more than 3 months, and symptoms cannot be explained by another condition. Myofascial pain syndrome is diagnosed if you have trigger points in your muscles, and those trigger points are tender and cause pain elsewhere in your body (referred pain). How is this treated? Treatment for these conditions depends on the type that you have. For fibromyalgia, a healthy lifestyle is the most important treatment including aerobic and strength exercises. Different types of medicines are used to help treat pain and include: NSAIDs. Medicines for treating depression. Medicines that help control seizures. Medicines that relax the muscles. Treatment for myofascial pain syndrome includes: Pain medicines, such as NSAIDs. Cooling and stretching of muscles. Massage therapy with myofascial release technique. Trigger point injections. Treating these conditions often requires a team of health care providers. These may include: Your primary care provider. A physical therapist. Complementary health care providers, such as massage therapists or acupuncturists. A psychiatrist for cognitive behavioral therapy. Follow these instructions at home: Medicines Take over-the-counter and prescription medicines only as told by your health care provider. Ask your health care provider if the medicine prescribed to you: Requires you to avoid driving or using machinery. Can cause constipation.  You may need to take these actions to prevent or treat constipation: Drink enough fluid to keep your urine pale   yellow. Take over-the-counter or prescription medicines. Eat foods that are high in fiber, such as beans, whole grains, and fresh fruits and vegetables. Limit foods that are high in fat and processed sugars, such as fried or sweet foods. Lifestyle  Do exercises as told by your health care provider or physical therapist. Practice relaxation techniques to control your stress. You may want to try: Biofeedback. Visual imagery. Hypnosis. Muscle relaxation. Yoga. Meditation. Maintain a healthy lifestyle. This includes eating a healthy diet and getting enough sleep. Do not use any products that contain nicotine or tobacco. These products include cigarettes, chewing tobacco, and vaping devices, such as e-cigarettes. If you need help quitting, ask your health care provider. General instructions Talk to your health care provider about complementary treatments, such as acupuncture or massage. Do not do activities that stress or strain your muscles. This includes repetitive motions and heavy lifting. Keep all follow-up visits. This is important. Where to find support Consider joining a support group with others who are diagnosed with this condition. National Fibromyalgia Association: fmaware.org Where to find more information U.S. Pain Foundation: uspainfoundation.org Contact a health care provider if: You have new symptoms. Your symptoms get worse or your pain is severe. You have side effects from your medicines. You have trouble sleeping. Your condition is causing depression or anxiety. Get help right away if: You have thoughts of hurting yourself or others. Get help right away if you feel like you may hurt yourself or others, or have thoughts about taking your own life. Go to your nearest emergency room or: Call 911. Call the National Suicide Prevention Lifeline at 1-800-273-8255  or 988. This is open 24 hours a day. Text the Crisis Text Line at 741741. This information is not intended to replace advice given to you by your health care provider. Make sure you discuss any questions you have with your health care provider. Document Revised: 02/23/2022 Document Reviewed: 04/18/2021 Elsevier Patient Education  2024 Elsevier Inc.  

## 2024-01-20 LAB — DRUG SCREEN 10 W/CONF, SERUM
Amphetamines, IA: NEGATIVE ng/mL
Barbiturates, IA: NEGATIVE ug/mL
Benzodiazepines, IA: NEGATIVE ng/mL
Cocaine & Metabolite, IA: NEGATIVE ng/mL
Methadone, IA: NEGATIVE ng/mL
Opiates, IA: NEGATIVE ng/mL
Oxycodones, IA: NEGATIVE ng/mL
Phencyclidine, IA: NEGATIVE ng/mL
Propoxyphene, IA: NEGATIVE ng/mL
THC(Marijuana) Metabolite, IA: NEGATIVE ng/mL

## 2024-01-21 ENCOUNTER — Other Ambulatory Visit: Payer: Self-pay | Admitting: Nurse Practitioner

## 2024-01-21 DIAGNOSIS — M797 Fibromyalgia: Secondary | ICD-10-CM

## 2024-01-21 DIAGNOSIS — F5101 Primary insomnia: Secondary | ICD-10-CM

## 2024-01-21 NOTE — Telephone Encounter (Signed)
 [facility]ations [facility]  Please [facility] to [facility]

## 2024-01-24 MED ORDER — TRAMADOL HCL 50 MG PO TABS: 50.0000 mg | ORAL_TABLET | Freq: Three times a day (TID) | ORAL | 0 refills | Status: DC | PRN

## 2024-01-24 MED ORDER — ZOLPIDEM TARTRATE 10 MG PO TABS: 10.0000 mg | ORAL_TABLET | Freq: Every day | ORAL | 2 refills | Status: DC

## 2024-01-24 NOTE — Telephone Encounter (Signed)
 Patient states [facility] received notice from [facility] that they will not be able to fill her prescriptions for tramadol  and ambien , as they are in process [facility] filled [facility].   [facility], patient advised it would not be able to be filled until 01/27/24. Patient will not be able to receive medication until 01/29/24, when delivered. Patient advised by [facility] to reach out to PCP and see if PCP would be willing to [facility] until patient is able to get Rx.   Patient has been out of medication since 01/22/24. Patient states she is in a lot of pain as she has been out of the tramadol . Patient declined to speak to NT. Patient requesting further assistance, [phone]

## 2024-02-22 ENCOUNTER — Other Ambulatory Visit: Payer: Self-pay | Admitting: Nurse Practitioner

## 2024-02-22 DIAGNOSIS — F5101 Primary insomnia: Secondary | ICD-10-CM

## 2024-02-22 NOTE — Telephone Encounter (Unsigned)
 Copied from CRM #[id number]. Topic: Clinical - Medication Refill >> Feb 22, 2024 11:43 AM Emylou G wrote: Medication: zolpidem  (AMBIEN ) 10 MG tablet  Has the patient contacted their pharmacy? No (Agent: If no, request that the patient contact the pharmacy for the refill. If patient does not wish to contact the pharmacy document the reason why and proceed with request.) (Agent: If yes, when and what did the pharmacy advise?)  This is the patient's preferred pharmacy:   [id number] [place] [place] - [address] [place] [address] Phone: [phone] Fax: [phone]  Is this the correct pharmacy for this prescription? Yes If no, delete pharmacy and type the correct one.   Has the prescription been filled recently? No  Is the patient out of the medication? Yes  Has the patient been seen for an appointment in the last year OR does the patient have an upcoming appointment? Yes  Can we respond through MyChart? No  Agent: Please be advised that Rx refills may take up to 3 business days. We ask that you follow-up with your pharmacy.

## 2024-02-28 MED ORDER — ZOLPIDEM TARTRATE 10 MG PO TABS
10.0000 mg | ORAL_TABLET | Freq: Every day | ORAL | 2 refills | Status: DC
Start: 2024-02-28 — End: 2024-04-20

## 2024-03-27 ENCOUNTER — Other Ambulatory Visit: Payer: Self-pay | Admitting: Nurse Practitioner

## 2024-04-20 ENCOUNTER — Ambulatory Visit: Payer: Self-pay | Admitting: Nurse Practitioner

## 2024-04-20 ENCOUNTER — Encounter: Payer: Self-pay | Admitting: Nurse Practitioner

## 2024-04-20 VITALS — BP 132/86 | HR 92 | Temp 97.0°F | Ht 61.0 in | Wt 302.0 lb

## 2024-04-20 DIAGNOSIS — F5101 Primary insomnia: Secondary | ICD-10-CM

## 2024-04-20 DIAGNOSIS — G43009 Migraine without aura, not intractable, without status migrainosus: Secondary | ICD-10-CM

## 2024-04-20 DIAGNOSIS — E782 Mixed hyperlipidemia: Secondary | ICD-10-CM | POA: Diagnosis not present

## 2024-04-20 DIAGNOSIS — K219 Gastro-esophageal reflux disease without esophagitis: Secondary | ICD-10-CM

## 2024-04-20 DIAGNOSIS — G2581 Restless legs syndrome: Secondary | ICD-10-CM

## 2024-04-20 DIAGNOSIS — M797 Fibromyalgia: Secondary | ICD-10-CM

## 2024-04-20 DIAGNOSIS — I10 Essential (primary) hypertension: Secondary | ICD-10-CM

## 2024-04-20 DIAGNOSIS — F3342 Major depressive disorder, recurrent, in full remission: Secondary | ICD-10-CM

## 2024-04-20 MED ORDER — BACLOFEN 10 MG PO TABS: 10.0000 mg | ORAL_TABLET | Freq: Three times a day (TID) | ORAL | 11 refills | Status: AC

## 2024-04-20 MED ORDER — GABAPENTIN 300 MG PO CAPS: 300.0000 mg | ORAL_CAPSULE | Freq: Three times a day (TID) | ORAL | 1 refills | Status: AC

## 2024-04-20 MED ORDER — TRAMADOL HCL 50 MG PO TABS: 50.0000 mg | ORAL_TABLET | Freq: Three times a day (TID) | ORAL | 0 refills | Status: AC | PRN

## 2024-04-20 MED ORDER — DULOXETINE HCL 60 MG PO CPEP: 60.0000 mg | ORAL_CAPSULE | Freq: Every day | ORAL | 1 refills | Status: AC

## 2024-04-20 MED ORDER — LISINOPRIL-HYDROCHLOROTHIAZIDE 20-12.5 MG PO TABS: 2.0000 | ORAL_TABLET | Freq: Every day | ORAL | 1 refills | Status: AC

## 2024-04-20 MED ORDER — ROSUVASTATIN CALCIUM 20 MG PO TABS: 20.0000 mg | ORAL_TABLET | Freq: Every day | ORAL | 1 refills | Status: AC

## 2024-04-20 MED ORDER — PANTOPRAZOLE SODIUM 40 MG PO TBEC: 40.0000 mg | DELAYED_RELEASE_TABLET | Freq: Every day | ORAL | 1 refills | Status: AC

## 2024-04-20 MED ORDER — TOPIRAMATE 50 MG PO TABS: 50.0000 mg | ORAL_TABLET | Freq: Three times a day (TID) | ORAL | 1 refills | Status: AC

## 2024-04-20 MED ORDER — OMEPRAZOLE 40 MG PO CPDR: 40.0000 mg | DELAYED_RELEASE_CAPSULE | Freq: Every day | ORAL | 1 refills | Status: AC

## 2024-04-20 MED ORDER — ZOLPIDEM TARTRATE 10 MG PO TABS: 10.0000 mg | ORAL_TABLET | Freq: Every day | ORAL | 2 refills | Status: AC

## 2024-04-20 MED ORDER — AMLODIPINE BESYLATE 5 MG PO TABS: 5.0000 mg | ORAL_TABLET | Freq: Every day | ORAL | 1 refills | Status: AC

## 2024-04-20 MED ORDER — PRAMIPEXOLE DIHYDROCHLORIDE 1 MG PO TABS: 1.0000 mg | ORAL_TABLET | Freq: Two times a day (BID) | ORAL | 1 refills | Status: AC

## 2024-04-20 NOTE — Patient Instructions (Signed)
Fall Prevention in the Home, Adult Falls can cause injuries and affect people of all ages. There are many simple things that you can do to make your home safe and to help prevent falls. If you need it, ask for help making these changes. What actions can I take to prevent falls? General information Use good lighting in all rooms. Make sure to: Replace any light bulbs that burn out. Turn on lights if it is dark and use night-lights. Keep items that you use often in easy-to-reach places. Lower the shelves around your home if needed. Move furniture so that there are clear paths around it. Do not keep throw rugs or other things on the floor that can make you trip. If any of your floors are uneven, fix them. Add color or contrast paint or tape to clearly mark and help you see: Grab bars or handrails. First and last steps of staircases. Where the edge of each step is. If you use a ladder or stepladder: Make sure that it is fully opened. Do not climb a closed ladder. Make sure the sides of the ladder are locked in place. Have someone hold the ladder while you use it. Know where your pets are as you move through your home. What can I do in the bathroom?     Keep the floor dry. Clean up any water that is on the floor right away. Remove soap buildup in the bathtub or shower. Buildup makes bathtubs and showers slippery. Use non-skid mats or decals on the floor of the bathtub or shower. Attach bath mats securely with double-sided, non-slip rug tape. If you need to sit down while you are in the shower, use a non-slip stool. Install grab bars by the toilet and in the bathtub and shower. Do not use towel bars as grab bars. What can I do in the bedroom? Make sure that you have a light by your bed that is easy to reach. Do not use any sheets or blankets on your bed that hang to the floor. Have a firm bench or chair with side arms that you can use for support when you get dressed. What can I do in  the kitchen? Clean up any spills right away. If you need to reach something above you, use a sturdy step stool that has a grab bar. Keep electrical cables out of the way. Do not use floor polish or wax that makes floors slippery. What can I do with my stairs? Do not leave anything on the stairs. Make sure that you have a light switch at the top and the bottom of the stairs. Have them installed if you do not have them. Make sure that there are handrails on both sides of the stairs. Fix handrails that are broken or loose. Make sure that handrails are as long as the staircases. Install non-slip stair treads on all stairs in your home if they do not have carpet. Avoid having throw rugs at the top or bottom of stairs, or secure the rugs with carpet tape to prevent them from moving. Choose a carpet design that does not hide the edge of steps on the stairs. Make sure that carpet is firmly attached to the stairs. Fix any carpet that is loose or worn. What can I do on the outside of my home? Use bright outdoor lighting. Repair the edges of walkways and driveways and fix any cracks. Clear paths of anything that can make you trip, such as tools or rocks. Add   color or contrast paint or tape to clearly mark and help you see high doorway thresholds. Trim any bushes or trees on the main path into your home. Check that handrails are securely fastened and in good repair. Both sides of all steps should have handrails. Install guardrails along the edges of any raised decks or porches. Have leaves, snow, and ice cleared regularly. Use sand, salt, or ice melt on walkways during winter months if you live where there is ice and snow. In the garage, clean up any spills right away, including grease or oil spills. What other actions can I take? Review your medicines with your health care provider. Some medicines can make you confused or feel dizzy. This can increase your chance of falling. Wear closed-toe shoes that  fit well and support your feet. Wear shoes that have rubber soles and low heels. Use a cane, walker, scooter, or crutches that help you move around if needed. Talk with your provider about other ways that you can decrease your risk of falls. This may include seeing a physical therapist to learn to do exercises to improve movement and strength. Where to find more information Centers for Disease Control and Prevention, [employer]: cdc.gov National Institute on Aging: nia.nih.gov National Institute on Aging: nia.nih.gov Contact a health care provider if: You are afraid of falling at home. You feel weak, drowsy, or dizzy at home. You fall at home. Get help right away if you: Lose consciousness or have trouble moving after a fall. Have a fall that causes a head injury. These symptoms may be an emergency. Get help right away. Call 911. Do not wait to see if the symptoms will go away. Do not drive yourself to the hospital. This information is not intended to replace advice given to you by your health care provider. Make sure you discuss any questions you have with your health care provider. Document Revised: 01/19/2022 Document Reviewed: 01/19/2022 Elsevier Patient Education  2024 Elsevier Inc.  

## 2024-04-20 NOTE — Progress Notes (Signed)
 Subjective:    Patient ID: Marie Carter, female    DOB: 06/16/1960, 63 y.o.   MRN: [id number]   Chief Complaint: medical management of chronic issues     HPI:  Marie Carter is a 63 y.o. who identifies as a female who was assigned female at birth.   Social history: Lives with: husband Work history: disability   Comes in today for follow up of the following chronic medical issues:  1. Primary hypertension No c/o chest pain, sob or headache. At home patient blood pressure is running around low 100's systolic. BP Readings from Last 3 Encounters:  01/18/24 (!) 162/91  10/15/23 (!) 144/96  07/22/23 (!) 145/89    2. Mixed hyperlipidemia Does not watch diet and does no dedicated exercise. Is on crestor  Lab Results  Component Value Date   CHOL 254 (H) 10/15/2023   HDL 51 10/15/2023   LDLCALC 179 (H) 10/15/2023   TRIG 133 10/15/2023   CHOLHDL 5.0 (H) 10/15/2023     3. Gastroesophageal reflux disease without esophagitis Takes protonix  daily and is doing well  4. Migraine without aura and without status migrainosus, not intractable Has occasionally. Is on topamax  for prevention  5. Recurrent major depressive disorder, in full remission (HCC) Currently not on any anti depressants    01/18/2024    1:53 PM 10/15/2023    1:37 PM 04/26/2023    3:05 PM  Depression screen PHQ 2/9  Decreased Interest 1 0 1  Down, Depressed, Hopeless 1 1 1   PHQ - 2 Score 2 1 2   Altered sleeping 0 2 0  Tired, decreased energy 0 1 0  Change in appetite 1 1 1   Feeling bad or failure about yourself  1 2 0  Trouble concentrating 0 0 0  Moving slowly or fidgety/restless 0 0 0  Suicidal thoughts 0 0 0  PHQ-9 Score 4  7  3    Difficult doing work/chores Not difficult at all Somewhat difficult Somewhat difficult     Data saved with a previous flowsheet row definition        6. Fibromyalgia Pain assessment: Cause of pain- fibromyalgia Pain location- varies from day to day Pain on scale  of 1-10- 6-7/10 Frequency- daily What increases pain-to much activity What makes pain Better-rest helps Effects on ADL - none Any change in general medical condition-no  Current opioids rx- ultram  50mg  TID # meds rx- 90 Effectiveness of current meds-helps Adverse reactions from pain meds-none Morphine equivalent- 15MME  Pill count performed-No Last drug screen - 07/24/22 ( high risk q63m, moderate risk q27m, low risk yearly ) Urine drug screen today- No Was the [facility] reviewed- yes  If yes were their any concerning findings? - no   Overdose risk: 1   Pain contract signed on:04/29/22   7. RLS (restless legs syndrome) Is on mirapex  which helps  8. Primary insomnia Ambien  to sleep- cannot sleep without meds  9. Severe obesity (BMI >= 40) (HCC) Weight is down 2lbs   Wt Readings from Last 3 Encounters:  01/18/24 (!) 301 lb (136.5 kg)  10/15/23 (!) 303 lb (137.4 kg)  07/22/23 (!) 305 lb 9.6 oz (138.6 kg)   BMI Readings from Last 3 Encounters:  01/18/24 56.87 kg/m  10/15/23 57.25 kg/m  07/22/23 57.74 kg/m       New complaints: None  today  Allergies  Allergen Reactions   Bee Pollen    Butrans [Buprenorphine] Nausea And Vomiting    patch   Coconut (Cocos Nucifera)  Mucinex  [Guaifenesin  Er] Rash   Outpatient Encounter Medications as of 04/20/2024  Medication Sig   albuterol  (VENTOLIN  HFA) 108 (90 Base) MCG/ACT inhaler USE 2 INHALATIONS EVERY 6 HOURS AS NEEDED FOR WHEEZING   amLODipine  (NORVASC ) 5 MG tablet Take 1 tablet (5 mg total) by mouth daily.   baclofen  (LIORESAL ) 10 MG tablet Take 1 tablet (10 mg total) by mouth 3 (three) times daily.   DULoxetine  (CYMBALTA ) 60 MG capsule Take 1 capsule (60 mg total) by mouth daily.   EPINEPHrine  0.3 mg/0.3 mL IJ SOAJ injection Inject 0.3 mLs (0.3 mg total) into the muscle once.   fesoterodine  (TOVIAZ ) 8 MG TB24 tablet Take 1 tablet (8 mg total) by mouth daily.   gabapentin  (NEURONTIN ) 300 MG capsule Take 1 capsule  (300 mg total) by mouth 3 (three) times daily.   lisinopril -hydrochlorothiazide  (ZESTORETIC ) 20-12.5 MG tablet Take 2 tablets by mouth daily.   medroxyPROGESTERone  (PROVERA ) 10 MG tablet TAKE 1 TABLET DAILY   omeprazole  (PRILOSEC) 40 MG capsule Take 1 capsule (40 mg total) by mouth daily.   pantoprazole  (PROTONIX ) 40 MG tablet Take 1 tablet (40 mg total) by mouth daily.   pramipexole  (MIRAPEX ) 1 MG tablet Take 1 tablet (1 mg total) by mouth 2 (two) times daily.   rosuvastatin  (CRESTOR ) 20 MG tablet Take 1 tablet (20 mg total) by mouth daily.   topiramate  (TOPAMAX ) 50 MG tablet Take 1 tablet (50 mg total) by mouth 3 (three) times daily.   traMADol  (ULTRAM ) 50 MG tablet Take 1 tablet (50 mg total) by mouth every 8 (eight) hours as needed.   traMADol  (ULTRAM ) 50 MG tablet Take 1 tablet (50 mg total) by mouth every 8 (eight) hours as needed.   traMADol  (ULTRAM ) 50 MG tablet Take 1 tablet (50 mg total) by mouth every 8 (eight) hours as needed.   zolpidem  (AMBIEN ) 10 MG tablet Take 1 tablet (10 mg total) by mouth at bedtime.   No facility-administered encounter medications on file as of 04/20/2024.    Past Surgical History:  Procedure Laterality Date   BREAST BIOPSY Left    x 2 / 10-12 years ago / benign   BREAST SURGERY     biopsy times 2   JOINT REPLACEMENT Right    JOINT REPLACEMENT Left    SPINE SURGERY     cervical spine    Family History  Problem Relation Age of Onset   Diabetes Mother    Heart attack Father 46   Diabetes Father    Breast cancer Maternal Grandmother    Stroke Maternal Grandmother    Colon cancer Other        family history          Review of Systems  Constitutional:  Negative for diaphoresis.  Eyes:  Negative for pain.  Respiratory:  Negative for shortness of breath.   Cardiovascular:  Negative for chest pain, palpitations and leg swelling.  Gastrointestinal:  Negative for abdominal pain.  Endocrine: Negative for polydipsia.  Skin:  Negative for  rash.  Neurological:  Negative for dizziness, weakness and headaches.  Hematological:  Does not bruise/bleed easily.  All other systems reviewed and are negative.      Objective:   Physical Exam Vitals and nursing note reviewed.  Constitutional:      General: She is not in acute distress.    Appearance: Normal appearance. She is well-developed.  HENT:     Head: Normocephalic.     Right Ear: Tympanic membrane normal.  Left Ear: Tympanic membrane normal.     Nose: Nose normal.     Mouth/Throat:     Mouth: Mucous membranes are moist.  Eyes:     Pupils: Pupils are equal, round, and reactive to light.  Neck:     Vascular: No carotid bruit or JVD.  Cardiovascular:     Rate and Rhythm: Normal rate and regular rhythm.     Heart sounds: Normal heart sounds.  Pulmonary:     Effort: Pulmonary effort is normal. No respiratory distress.     Breath sounds: Normal breath sounds. No wheezing or rales.  Chest:     Chest wall: No tenderness.  Abdominal:     General: Bowel sounds are normal. There is no distension or abdominal bruit.     Palpations: Abdomen is soft. There is no hepatomegaly, splenomegaly, mass or pulsatile mass.     Tenderness: There is no abdominal tenderness.  Musculoskeletal:        General: Normal range of motion.     Cervical back: Normal range of motion and neck supple.     Comments: Walks with walker  Lymphadenopathy:     Cervical: No cervical adenopathy.  Skin:    General: Skin is warm and dry.  Neurological:     Mental Status: She is alert and oriented to person, place, and time.     Deep Tendon Reflexes: Reflexes are normal and symmetric.  Psychiatric:        Behavior: Behavior normal.        Thought Content: Thought content normal.        Judgment: Judgment normal.    BP 132/86   Pulse 92   Temp (!) 97 F (36.1 C) (Temporal)   Ht 5' 1 (1.549 m)   Wt (!) 302 lb (137 kg)   LMP 09/13/2012   SpO2 98%   BMI 57.06 kg/m     LMP 09/13/2012          Assessment & Plan:   Marie Carter comes in today with chief complaint of medical management of chronic issues    Diagnosis and orders addressed:  1. Primary hypertension Low sodium diet - CBC with Differential/Platelet - CMP14+EGFR - amLODipine  (NORVASC ) 5 MG tablet; Take 1 tablet (5 mg total) by mouth daily.  Dispense: 90 tablet; Refill: 1 - lisinopril -hydrochlorothiazide  (ZESTORETIC ) 20-12.5 MG tablet; Take 2 tablets by mouth daily.  Dispense: 180 tablet; Refill: 1  2. Mixed hyperlipidemia Low fat diet - Lipid panel - rosuvastatin  (CRESTOR ) 20 MG tablet; TAKE 1 TABLET DAILY.  Dispense: 90 tablet; Refill: 0  3. Gastroesophageal reflux disease without esophagitis Avoid spicy foods Do not eat 2 hours prior to bedtime  - omeprazole  (PRILOSEC) 40 MG capsule; Take 1 capsule (40 mg total) by mouth daily.  Dispense: 90 capsule; Refill: 1 - pantoprazole  (PROTONIX ) 40 MG tablet; Take 1 tablet (40 mg total) by mouth daily.  Dispense: 90 tablet; Refill: 1  4. Migraine without aura and without status migrainosus, not intractable Avoid caffeine - topiramate  (TOPAMAX ) 50 MG tablet; Take 1 tablet (50 mg total) by mouth 3 (three) times daily.  Dispense: 270 tablet; Refill: 1  5. Recurrent major depressive disorder, in full remission [facility]) Stress management  6. Fibromyalgia Keep warm - ToxASSURE Select 13 (MW), Urine - baclofen  (LIORESAL ) 10 MG tablet; Take 1 tablet (10 mg total) by mouth 3 (three) times daily.  Dispense: 90 tablet; Refill: 11 - DULoxetine  (CYMBALTA ) 60 MG capsule; Take 1 capsule (60 mg total)  by mouth daily.  Dispense: 90 capsule; Refill: 1 - gabapentin  (NEURONTIN ) 300 MG capsule; Take 1 capsule (300 mg total) by mouth 3 (three) times daily.  Dispense: 540 capsule; Refill: 1 - traMADol  (ULTRAM ) 50 MG tablet; Take 1 tablet (50 mg total) by mouth every 8 (eight) hours as needed.  Dispense: 90 tablet; Refill: 0 - traMADol  (ULTRAM ) 50 MG tablet; Take 1 tablet (50  mg total) by mouth every 8 (eight) hours as needed.  Dispense: 90 tablet; Refill: 0 - traMADol  (ULTRAM ) 50 MG tablet; Take 1 tablet (50 mg total) by mouth every 8 (eight) hours as needed.  Dispense: 90 tablet; Refill: 0  7. RLS (restless legs syndrome) - pramipexole  (MIRAPEX ) 1 MG tablet; Take 1 tablet (1 mg total) by mouth 2 (two) times daily.  Dispense: 180 tablet; Refill: 1  8. Primary insomnia Bedtime routine - zolpidem  (AMBIEN ) 10 MG tablet; Take 1 tablet (10 mg total) by mouth at bedtime.  Dispense: 30 tablet; Refill: 5  9. Severe obesity (BMI >= 40) (HCC) Discussed diet and exercise for person with BMI >25 Will recheck weight in 3-6 months    Labs pending Health Maintenance reviewed Diet and exercise encouraged  Follow up plan: 3 months   Mary-Margaret Gladis, FNP

## 2024-04-24 ENCOUNTER — Other Ambulatory Visit: Payer: Self-pay | Admitting: Nurse Practitioner

## 2024-04-24 DIAGNOSIS — R3915 Urgency of urination: Secondary | ICD-10-CM

## 2024-05-08 ENCOUNTER — Ambulatory Visit: Payer: Self-pay

## 2024-05-08 NOTE — Telephone Encounter (Signed)
 Noted

## 2024-05-08 NOTE — Telephone Encounter (Signed)
  FYI Only or Action Required?: FYI only for provider: appointment scheduled on 12/15.  Patient was last seen in primary care on 04/20/2024 by Gladis Mustard, FNP.  Called Nurse Triage reporting Back Pain.  Symptoms began a week ago.  Interventions attempted: Ice/heat application.  Symptoms are: unchanged.  Triage Disposition: See PCP When Office is Open (Within 3 Days)  Patient/caregiver understands and will follow disposition?: Yes       Copied from CRM #[id number]. Topic: Clinical - Red Word Triage >> May 08, 2024  8:02 AM Ahlexyia S wrote: Red Word that prompted transfer to Nurse Triage: Pt called in stating that she fell last week and her lower back and hip hurts really bad. Pt stated it isnt severe but its painful. Pt also mentioned that she gets dizzy sometimes but doesn't think she has anything to do with it. Warm transferred to nurse triage. Reason for Disposition  [1] MODERATE back pain (e.g., interferes with normal activities) AND [2] present > 3 days  Answer Assessment - Initial Assessment Questions 1. ONSET: When did the pain begin? (e.g., minutes, hours, days)     X 1 week   2. LOCATION: Where does it hurt? (upper, mid or lower back)     Mid-lower back   3. SEVERITY: How bad is the pain?  (e.g., Scale 1-10; mild, moderate, or severe)     9/10  4. PATTERN: Is the pain constant? (e.g., yes, no; constant, intermittent)      Intermittent   5. RADIATION: Does the pain shoot into your legs or somewhere else?     Left hip/leg  6. CAUSE:  What do you think is causing the back pain?      Fall last week after getting out of shower  7. BACK OVERUSE:  Any recent lifting of heavy objects, strenuous work or exercise?     No   8. MEDICINES: What have you taken so far for the pain? (e.g., nothing, acetaminophen, NSAIDS)     No    9. NEUROLOGIC SYMPTOMS: Do you have any weakness, numbness, or problems with bowel/bladder control?     No   10.  OTHER SYMPTOMS: Do you have any other symptoms? (e.g., fever, abdomen pain, burning with urination, blood in urine)    Patient called in to triage with complaints of lower-mid back pain from fall x 1 week ago while getting out of the shower. This has been ongoing for one week. The patient stated the pain worsens when walking.  For home care, the patient is not taking OTC medications as it will interact with her other prescriptions.  Appointment scheduled for further evaluation on 12/15 as this was her preference. She agrees with the plan of care, and will reach out if symptoms worsen or persist.  Protocols used: Back Pain-A-AH

## 2024-05-15 ENCOUNTER — Encounter: Payer: Self-pay | Admitting: Nurse Practitioner

## 2024-05-15 ENCOUNTER — Ambulatory Visit: Admitting: Nurse Practitioner

## 2024-05-15 VITALS — BP 148/84 | HR 63 | Temp 98.2°F | Ht 61.0 in | Wt 303.0 lb

## 2024-05-15 DIAGNOSIS — M25551 Pain in right hip: Secondary | ICD-10-CM

## 2024-05-15 DIAGNOSIS — W19XXXA Unspecified fall, initial encounter: Secondary | ICD-10-CM

## 2024-05-15 NOTE — Patient Instructions (Signed)
 Fall Prevention in the Home, Adult Falls can cause injuries and can happen to people of all ages. There are many things you can do to make your home safer and to help prevent falls. What actions can I take to prevent falls? General information Use good lighting in all rooms. Make sure to: Replace any light bulbs that burn out. Turn on the lights in dark areas and use night-lights. Keep items that you use often in easy-to-reach places. Lower the shelves around your home if needed. Move furniture so that there are clear paths around it. Do not use throw rugs or other things on the floor that can make you trip. If any of your floors are uneven, fix them. Add color or contrast paint or tape to clearly mark and help you see: Grab bars or handrails. First and last steps of staircases. Where the edge of each step is. If you use a ladder or stepladder: Make sure that it is fully opened. Do not climb a closed ladder. Make sure the sides of the ladder are locked in place. Have someone hold the ladder while you use it. Know where your pets are as you move through your home. What can I do in the bathroom?     Keep the floor dry. Clean up any water on the floor right away. Remove soap buildup in the bathtub or shower. Buildup makes bathtubs and showers slippery. Use non-skid mats or decals on the floor of the bathtub or shower. Attach bath mats securely with double-sided, non-slip rug tape. If you need to sit down in the shower, use a non-slip stool. Install grab bars by the toilet and in the bathtub and shower. Do not use towel bars as grab bars. What can I do in the bedroom? Make sure that you have a light by your bed that is easy to reach. Do not use any sheets or blankets on your bed that hang to the floor. Have a firm chair or bench with side arms that you can use for support when you get dressed. What can I do in the kitchen? Clean up any spills right away. If you need to reach something  above you, use a step stool with a grab bar. Keep electrical cords out of the way. Do not use floor polish or wax that makes floors slippery. What can I do with my stairs? Do not leave anything on the stairs. Make sure that you have a light switch at the top and the bottom of the stairs. Make sure that there are handrails on both sides of the stairs. Fix handrails that are broken or loose. Install non-slip stair treads on all your stairs if they do not have carpet. Avoid having throw rugs at the top or bottom of the stairs. Choose a carpet that does not hide the edge of the steps on the stairs. Make sure that the carpet is firmly attached to the stairs. Fix carpet that is loose or worn. What can I do on the outside of my home? Use bright outdoor lighting. Fix the edges of walkways and driveways and fix any cracks. Clear paths of anything that can make you trip, such as tools or rocks. Add color or contrast paint or tape to clearly mark and help you see anything that might make you trip as you walk through a door, such as a raised step or threshold. Trim any bushes or trees on paths to your home. Check to see if handrails are loose  or broken and that both sides of all steps have handrails. Install guardrails along the edges of any raised decks and porches. Have leaves, snow, or ice cleared regularly. Use sand, salt, or ice melter on paths if you live where there is ice and snow during the winter. Clean up any spills in your garage right away. This includes grease or oil spills. What other actions can I take? Review your medicines with your doctor. Some medicines can cause dizziness or changes in blood pressure, which increase your risk of falling. Wear shoes that: Have a low heel. Do not wear high heels. Have rubber bottoms and are closed at the toe. Feel good on your feet and fit well. Use tools that help you move around if needed. These include: Canes. Walkers. Scooters. Crutches. Ask  your doctor what else you can do to help prevent falls. This may include seeing a physical therapist to learn to do exercises to move better and get stronger. Where to find more information Centers for Disease Control and Prevention, STEAD[employer]: TonerPromos.no [employer] on Aging: BaseRingTones.pl National Institute on Aging: BaseRingTones.pl Contact a doctor if: You are afraid of falling at home. You feel weak, drowsy, or dizzy at home. You fall at home. Get help right away if you: Lose consciousness or have trouble moving after a fall. Have a fall that causes a head injury. These symptoms may be an emergency. Get help right away. Call 911. Do not wait to see if the symptoms will go away. Do not drive yourself to the hospital. This information is not intended to replace advice given to you by your health care provider. Make sure you discuss any questions you have with your health care provider. Document Revised: 01/19/2022 Document Reviewed: 01/19/2022 Elsevier Patient Education  2024 [employer].

## 2024-05-15 NOTE — Progress Notes (Signed)
° °  Subjective:    Patient ID: Marie Carter, female    DOB: 08/08/1960, 63 y.o.   MRN: [id number]   Chief Complaint: leg and hip pain Amon getting out of the shower and now having pain run up her leg)   HPI  Patient fell 2 weeks ago. She was coming out of restroom and fell. Landed on right hip. She did not go to the hospital. She says now she has shooting pain up her right leg and has swelling.  She is able to ambulate. Moving a certain way increases pain.   Patient Active Problem List   Diagnosis Date Noted   Migraine without aura and without status migrainosus, not intractable 11/05/2014   Severe obesity (BMI >= 40) (HCC) 07/23/2014   RLS (restless legs syndrome) 01/15/2014   GERD (gastroesophageal reflux disease) 10/05/2012   Hyperlipidemia 10/05/2012   Fibromyalgia 10/05/2012   Depression 10/05/2012   Asthma, chronic 10/05/2012   Hypertension 10/05/2012   Insomnia 10/05/2012       Review of Systems  Constitutional:  Negative for diaphoresis.  Eyes:  Negative for pain.  Respiratory:  Negative for shortness of breath.   Cardiovascular:  Negative for chest pain, palpitations and leg swelling.  Gastrointestinal:  Negative for abdominal pain.  Endocrine: Negative for polydipsia.  Musculoskeletal:  Positive for joint swelling.  Skin:  Negative for rash.  Neurological:  Negative for dizziness, weakness and headaches.  Hematological:  Does not bruise/bleed easily.  All other systems reviewed and are negative.      Objective:   Physical Exam Constitutional:      Appearance: Normal appearance. She is obese.  Cardiovascular:     Rate and Rhythm: Normal rate and regular rhythm.     Heart sounds: Normal heart sounds.  Pulmonary:     Effort: Pulmonary effort is normal.     Breath sounds: Normal breath sounds.  Musculoskeletal:     Comments: Walking with walker Pain with rotation inward of right hip Right thigh edema  Skin:    General: Skin is warm.  Neurological:      General: No focal deficit present.     Mental Status: She is alert and oriented to person, place, and time.  Psychiatric:        Mood and Affect: Mood normal.        Behavior: Behavior normal.    BP (!) 148/84   Pulse 63   Temp 98.2 F (36.8 C) (Temporal)   Ht 5' 1 (1.549 m)   Wt (!) 303 lb (137.4 kg)   LMP 09/13/2012   SpO2 93%   BMI 57.25 kg/m    Refuses hip xray     Assessment & Plan:   Marie Carter in today with chief complaint of leg and hip pain Amon getting out of the shower and now having pain run up her leg)   1. Right hip pain (Primary) Continue ultram  as ordered  2. Fall, initial encounter Fall precautions Encouraged home health PT to build up strength    The above assessment and management plan was discussed with the patient. The patient verbalized understanding of and has agreed to the management plan. Patient is aware to call the clinic if symptoms persist or worsen. Patient is aware when to return to the clinic for a follow-up visit. Patient educated on when it is appropriate to go to the emergency department.   Mary-Margaret Gladis, FNP

## 2024-06-26 ENCOUNTER — Other Ambulatory Visit: Payer: Self-pay | Admitting: Nurse Practitioner

## 2024-07-07 ENCOUNTER — Other Ambulatory Visit: Payer: Self-pay | Admitting: Nurse Practitioner

## 2024-07-18 ENCOUNTER — Ambulatory Visit: Admitting: Nurse Practitioner

## 2024-07-25 ENCOUNTER — Encounter: Admitting: Nurse Practitioner
